# Patient Record
Sex: Male | Born: 1955 | Race: White | Hispanic: No | Marital: Married | Smoking: Never smoker
Health system: Southern US, Community
[De-identification: ages and names within clinical notes are randomized; demographics above are authoritative.]

## PROBLEM LIST (undated history)

## (undated) DIAGNOSIS — K219 Gastro-esophageal reflux disease without esophagitis: Secondary | ICD-10-CM

## (undated) DIAGNOSIS — R519 Headache, unspecified: Secondary | ICD-10-CM

## (undated) DIAGNOSIS — D649 Anemia, unspecified: Secondary | ICD-10-CM

## (undated) DIAGNOSIS — Z973 Presence of spectacles and contact lenses: Secondary | ICD-10-CM

## (undated) DIAGNOSIS — H269 Unspecified cataract: Secondary | ICD-10-CM

## (undated) DIAGNOSIS — R200 Anesthesia of skin: Secondary | ICD-10-CM

## (undated) DIAGNOSIS — F419 Anxiety disorder, unspecified: Secondary | ICD-10-CM

## (undated) DIAGNOSIS — M1990 Unspecified osteoarthritis, unspecified site: Secondary | ICD-10-CM

## (undated) DIAGNOSIS — Z87442 Personal history of urinary calculi: Secondary | ICD-10-CM

## (undated) DIAGNOSIS — R51 Headache: Secondary | ICD-10-CM

## (undated) DIAGNOSIS — I10 Essential (primary) hypertension: Secondary | ICD-10-CM

## (undated) DIAGNOSIS — G2581 Restless legs syndrome: Secondary | ICD-10-CM

## (undated) DIAGNOSIS — F988 Other specified behavioral and emotional disorders with onset usually occurring in childhood and adolescence: Secondary | ICD-10-CM

## (undated) DIAGNOSIS — F329 Major depressive disorder, single episode, unspecified: Secondary | ICD-10-CM

## (undated) DIAGNOSIS — M069 Rheumatoid arthritis, unspecified: Secondary | ICD-10-CM

## (undated) DIAGNOSIS — R202 Paresthesia of skin: Secondary | ICD-10-CM

## (undated) DIAGNOSIS — Z9289 Personal history of other medical treatment: Secondary | ICD-10-CM

## (undated) DIAGNOSIS — F32A Depression, unspecified: Secondary | ICD-10-CM

## (undated) DIAGNOSIS — N400 Enlarged prostate without lower urinary tract symptoms: Secondary | ICD-10-CM

## (undated) DIAGNOSIS — F429 Obsessive-compulsive disorder, unspecified: Secondary | ICD-10-CM

---

## 2003-06-04 ENCOUNTER — Encounter: Admission: RE | Admit: 2003-06-04 | Discharge: 2003-06-04 | Payer: Self-pay | Admitting: Internal Medicine

## 2003-06-04 ENCOUNTER — Encounter: Payer: Self-pay | Admitting: Internal Medicine

## 2006-08-14 HISTORY — PX: ROTATOR CUFF REPAIR: SHX139

## 2012-01-13 HISTORY — PX: TOTAL HIP ARTHROPLASTY: SHX124

## 2012-06-14 HISTORY — PX: HAND TENDON SURGERY: SHX663

## 2012-08-02 ENCOUNTER — Other Ambulatory Visit: Payer: Self-pay | Admitting: Neurosurgery

## 2012-08-30 ENCOUNTER — Other Ambulatory Visit: Payer: Self-pay

## 2012-09-03 ENCOUNTER — Encounter (HOSPITAL_COMMUNITY)
Admission: RE | Admit: 2012-09-03 | Discharge: 2012-09-03 | Payer: BC Managed Care – PPO | Source: Ambulatory Visit | Attending: Neurosurgery | Admitting: Neurosurgery

## 2012-09-10 ENCOUNTER — Encounter (HOSPITAL_COMMUNITY): Payer: Self-pay

## 2012-09-10 ENCOUNTER — Encounter (HOSPITAL_COMMUNITY)
Admission: RE | Admit: 2012-09-10 | Discharge: 2012-09-10 | Disposition: A | Discharge status: Home / Self Care | Payer: BC Managed Care – PPO | Source: Ambulatory Visit | Attending: Neurosurgery | Admitting: Neurosurgery

## 2012-09-10 HISTORY — DX: Obsessive-compulsive disorder, unspecified: F42.9

## 2012-09-10 HISTORY — DX: Rheumatoid arthritis, unspecified: M06.9

## 2012-09-10 HISTORY — DX: Depression, unspecified: F32.A

## 2012-09-10 HISTORY — DX: Gastro-esophageal reflux disease without esophagitis: K21.9

## 2012-09-10 HISTORY — DX: Major depressive disorder, single episode, unspecified: F32.9

## 2012-09-10 LAB — BASIC METABOLIC PANEL
BUN: 20 mg/dL (ref 6–23)
Chloride: 102 mEq/L (ref 96–112)
Glucose, Bld: 92 mg/dL (ref 70–99)
Potassium: 3.6 mEq/L (ref 3.5–5.1)

## 2012-09-10 LAB — TYPE AND SCREEN: Antibody Screen: NEGATIVE

## 2012-09-10 LAB — CBC
HCT: 42 % (ref 39.0–52.0)
Hemoglobin: 15.5 g/dL (ref 13.0–17.0)
MCH: 33.8 pg (ref 26.0–34.0)
MCHC: 36.9 g/dL — ABNORMAL HIGH (ref 30.0–36.0)

## 2012-09-10 NOTE — Pre-Procedure Instructions (Signed)
[name]  09/10/2012   Your procedure is scheduled on:  Friday, January 31  Report to [facility] at 0530 AM.  Call this number if you have problems the morning of surgery: [phone]   Remember:   Do not eat food or drink liquids after midnight. Thursday night   Take these medicines the morning of surgery with A SIP OF WATER: Effexor,Omeprazole, Hydrocodone   Do not wear jewelry, make-up or nail polish.  Do not wear lotions, powders, or perfumes. You may wear deodorant.  Do not shave 48 hours prior to surgery. Men may shave face and neck.  Do not bring valuables to the hospital.  Contacts, dentures or bridgework may not be worn into surgery.  Leave suitcase in the car. After surgery it may be brought to your room.  For patients admitted to the hospital, checkout time is 11:00 AM the day of  discharge.         Special Instructions: Shower using CHG 2 nights before surgery and the night before surgery.  If you shower the day of surgery use CHG.  Use special wash - you have one bottle of CHG for all showers.  You should use approximately 1/3 of the bottle for each shower.   Please read over the following fact sheets that you were given: Pain Booklet, Coughing and Deep Breathing, Blood Transfusion Information, MRSA Information and Surgical Site Infection Prevention

## 2012-09-12 MED ORDER — VANCOMYCIN HCL IN DEXTROSE 1-5 GM/200ML-% IV SOLN
1000.0000 mg | Freq: Once | INTRAVENOUS | Status: AC
  Administered 2012-09-13: 1000 mg via INTRAVENOUS
  Filled 2012-09-12: qty 200

## 2012-09-13 ENCOUNTER — Encounter (HOSPITAL_COMMUNITY): Payer: Self-pay | Admitting: *Deleted

## 2012-09-13 ENCOUNTER — Encounter (HOSPITAL_COMMUNITY): Payer: Self-pay | Admitting: Anesthesiology

## 2012-09-13 ENCOUNTER — Encounter (HOSPITAL_COMMUNITY)
Admission: RE | Disposition: A | Discharge status: Home / Self Care | Payer: Self-pay | Source: Ambulatory Visit | Attending: Neurosurgery

## 2012-09-13 ENCOUNTER — Inpatient Hospital Stay (HOSPITAL_COMMUNITY)
Admission: RE | Admit: 2012-09-13 | Discharge: 2012-09-16 | DRG: 807 | Disposition: A | Discharge status: Home / Self Care | Payer: BC Managed Care – PPO | Source: Ambulatory Visit | Attending: Neurosurgery | Admitting: Neurosurgery

## 2012-09-13 ENCOUNTER — Ambulatory Visit (HOSPITAL_COMMUNITY): Payer: BC Managed Care – PPO

## 2012-09-13 ENCOUNTER — Ambulatory Visit (HOSPITAL_COMMUNITY): Payer: BC Managed Care – PPO | Admitting: Anesthesiology

## 2012-09-13 DIAGNOSIS — M549 Dorsalgia, unspecified: Secondary | ICD-10-CM

## 2012-09-13 DIAGNOSIS — M069 Rheumatoid arthritis, unspecified: Secondary | ICD-10-CM | POA: Diagnosis present

## 2012-09-13 DIAGNOSIS — Z79899 Other long term (current) drug therapy: Secondary | ICD-10-CM

## 2012-09-13 DIAGNOSIS — M51379 Other intervertebral disc degeneration, lumbosacral region without mention of lumbar back pain or lower extremity pain: Secondary | ICD-10-CM | POA: Diagnosis present

## 2012-09-13 DIAGNOSIS — Z01812 Encounter for preprocedural laboratory examination: Secondary | ICD-10-CM

## 2012-09-13 DIAGNOSIS — M4120 Other idiopathic scoliosis, site unspecified: Secondary | ICD-10-CM | POA: Diagnosis present

## 2012-09-13 DIAGNOSIS — F3289 Other specified depressive episodes: Secondary | ICD-10-CM | POA: Diagnosis present

## 2012-09-13 DIAGNOSIS — M5137 Other intervertebral disc degeneration, lumbosacral region: Secondary | ICD-10-CM | POA: Diagnosis present

## 2012-09-13 DIAGNOSIS — F329 Major depressive disorder, single episode, unspecified: Secondary | ICD-10-CM | POA: Diagnosis present

## 2012-09-13 DIAGNOSIS — M47816 Spondylosis without myelopathy or radiculopathy, lumbar region: Secondary | ICD-10-CM | POA: Diagnosis present

## 2012-09-13 DIAGNOSIS — M47817 Spondylosis without myelopathy or radiculopathy, lumbosacral region: Principal | ICD-10-CM | POA: Diagnosis present

## 2012-09-13 DIAGNOSIS — K219 Gastro-esophageal reflux disease without esophagitis: Secondary | ICD-10-CM | POA: Diagnosis present

## 2012-09-13 DIAGNOSIS — Z96649 Presence of unspecified artificial hip joint: Secondary | ICD-10-CM

## 2012-09-13 HISTORY — PX: ABDOMINAL EXPOSURE: SHX5708

## 2012-09-13 HISTORY — PX: ANTERIOR LUMBAR FUSION: SHX1170

## 2012-09-13 HISTORY — PX: LUMBAR PERCUTANEOUS PEDICLE SCREW 2 LEVEL: SHX5561

## 2012-09-13 HISTORY — PX: ANTERIOR LAT LUMBAR FUSION: SHX1168

## 2012-09-13 LAB — POCT I-STAT 4, (NA,K, GLUC, HGB,HCT)
HCT: 30 % — ABNORMAL LOW (ref 39.0–52.0)
Hemoglobin: 10.2 g/dL — ABNORMAL LOW (ref 13.0–17.0)
Potassium: 4.1 mEq/L (ref 3.5–5.1)
Sodium: 139 mEq/L (ref 135–145)

## 2012-09-13 SURGERY — ANTERIOR LUMBAR FUSION 1 LEVEL
Anesthesia: General | Laterality: Right | Wound class: Clean

## 2012-09-13 MED ORDER — ONDANSETRON HCL 4 MG/2ML IJ SOLN: 4.0000 mg | INTRAMUSCULAR | Status: DC | PRN

## 2012-09-13 MED ORDER — SODIUM CHLORIDE 0.9 % IJ SOLN: 3.0000 mL | INTRAMUSCULAR | Status: DC | PRN

## 2012-09-13 MED ORDER — ROCURONIUM BROMIDE 100 MG/10ML IV SOLN
INTRAVENOUS | Status: DC | PRN
  Administered 2012-09-13: 50 mg via INTRAVENOUS

## 2012-09-13 MED ORDER — VECURONIUM BROMIDE 10 MG IV SOLR
INTRAVENOUS | Status: DC | PRN
  Administered 2012-09-13 (×2): 1 mg via INTRAVENOUS

## 2012-09-13 MED ORDER — ROPINIROLE HCL 1 MG PO TABS
4.0000 mg | ORAL_TABLET | Freq: Three times a day (TID) | ORAL | Status: DC
  Administered 2012-09-13 – 2012-09-16 (×8): 4 mg via ORAL
  Filled 2012-09-13 (×11): qty 4

## 2012-09-13 MED ORDER — PANTOPRAZOLE SODIUM 40 MG IV SOLR
40.0000 mg | Freq: Every day | INTRAVENOUS | Status: DC
Start: 2012-09-13 — End: 2012-09-13
  Filled 2012-09-13 (×2): qty 40

## 2012-09-13 MED ORDER — CYCLOBENZAPRINE HCL 10 MG PO TABS
10.0000 mg | ORAL_TABLET | Freq: Three times a day (TID) | ORAL | Status: DC | PRN
  Administered 2012-09-13 – 2012-09-16 (×5): 10 mg via ORAL
  Filled 2012-09-13 (×4): qty 1

## 2012-09-13 MED ORDER — 0.9 % SODIUM CHLORIDE (POUR BTL) OPTIME
TOPICAL | Status: DC | PRN
  Administered 2012-09-13: 1000 mL

## 2012-09-13 MED ORDER — HEMOSTATIC AGENTS (NO CHARGE) OPTIME
TOPICAL | Status: DC | PRN
  Administered 2012-09-13: 1 via TOPICAL

## 2012-09-13 MED ORDER — MIDAZOLAM HCL 5 MG/5ML IJ SOLN
INTRAMUSCULAR | Status: DC | PRN
  Administered 2012-09-13: 2 mg via INTRAVENOUS

## 2012-09-13 MED ORDER — BACITRACIN 50000 UNITS IM SOLR
INTRAMUSCULAR | Status: AC
  Filled 2012-09-13: qty 1

## 2012-09-13 MED ORDER — MEPERIDINE HCL 25 MG/ML IJ SOLN
6.2500 mg | INTRAMUSCULAR | Status: DC | PRN
  Administered 2012-09-13 (×2): 12.5 mg via INTRAVENOUS

## 2012-09-13 MED ORDER — ARTIFICIAL TEARS OP OINT
TOPICAL_OINTMENT | OPHTHALMIC | Status: DC | PRN
  Administered 2012-09-13: 1 via OPHTHALMIC

## 2012-09-13 MED ORDER — PHENOL 1.4 % MT LIQD: 1.0000 | OROMUCOSAL | Status: DC | PRN

## 2012-09-13 MED ORDER — ACETAMINOPHEN 650 MG RE SUPP: 650.0000 mg | RECTAL | Status: DC | PRN

## 2012-09-13 MED ORDER — HYDROMORPHONE HCL PF 1 MG/ML IJ SOLN
INTRAMUSCULAR | Status: AC
  Administered 2012-09-13: 0.5 mg via INTRAVENOUS
  Filled 2012-09-13: qty 1

## 2012-09-13 MED ORDER — SODIUM CHLORIDE 0.9 % IR SOLN
Status: DC | PRN
  Administered 2012-09-13 (×3)

## 2012-09-13 MED ORDER — LACTATED RINGERS IV SOLN
INTRAVENOUS | Status: DC | PRN
  Administered 2012-09-13 (×2): via INTRAVENOUS

## 2012-09-13 MED ORDER — CYCLOBENZAPRINE HCL 10 MG PO TABS
ORAL_TABLET | ORAL | Status: AC
  Administered 2012-09-13: 10 mg via ORAL
  Filled 2012-09-13: qty 1

## 2012-09-13 MED ORDER — ALBUMIN HUMAN 5 % IV SOLN
INTRAVENOUS | Status: DC | PRN
  Administered 2012-09-13 (×3): via INTRAVENOUS

## 2012-09-13 MED ORDER — BUPIVACAINE HCL (PF) 0.25 % IJ SOLN
INTRAMUSCULAR | Status: DC | PRN
  Administered 2012-09-13: 26 mL

## 2012-09-13 MED ORDER — BACITRACIN 50000 UNITS IM SOLR
INTRAMUSCULAR | Status: AC
  Filled 2012-09-13: qty 2

## 2012-09-13 MED ORDER — MUPIROCIN 2 % EX OINT
TOPICAL_OINTMENT | CUTANEOUS | Status: AC
  Administered 2012-09-13: 1 via NASAL
  Filled 2012-09-13: qty 22

## 2012-09-13 MED ORDER — SUFENTANIL CITRATE 50 MCG/ML IV SOLN
INTRAVENOUS | Status: DC | PRN
  Administered 2012-09-13 (×10): 10 ug via INTRAVENOUS

## 2012-09-13 MED ORDER — SODIUM CHLORIDE 0.9 % IJ SOLN
3.0000 mL | Freq: Two times a day (BID) | INTRAMUSCULAR | Status: DC
Start: 2012-09-13 — End: 2012-09-16
  Administered 2012-09-14 – 2012-09-15 (×3): 3 mL via INTRAVENOUS

## 2012-09-13 MED ORDER — MEPERIDINE HCL 50 MG/ML IJ SOLN
INTRAMUSCULAR | Status: AC
  Filled 2012-09-13: qty 1

## 2012-09-13 MED ORDER — ONDANSETRON HCL 4 MG/2ML IJ SOLN
INTRAMUSCULAR | Status: DC | PRN
  Administered 2012-09-13: 4 mg via INTRAVENOUS

## 2012-09-13 MED ORDER — HYDROMORPHONE HCL PF 1 MG/ML IJ SOLN
1.0000 mg | INTRAMUSCULAR | Status: DC | PRN
  Administered 2012-09-13: 1 mg via INTRAMUSCULAR
  Administered 2012-09-13 – 2012-09-14 (×2): 1.5 mg via INTRAMUSCULAR
  Administered 2012-09-14: 1 mg via INTRAMUSCULAR
  Administered 2012-09-14 (×3): 1.5 mg via INTRAMUSCULAR
  Administered 2012-09-15 – 2012-09-16 (×3): 1 mg via INTRAMUSCULAR
  Filled 2012-09-13: qty 1
  Filled 2012-09-13: qty 2
  Filled 2012-09-13: qty 1
  Filled 2012-09-13: qty 2
  Filled 2012-09-13 (×3): qty 1
  Filled 2012-09-13 (×3): qty 2

## 2012-09-13 MED ORDER — SODIUM CHLORIDE 0.9 % IV SOLN
INTRAVENOUS | Status: AC
  Filled 2012-09-13: qty 1000

## 2012-09-13 MED ORDER — SODIUM CHLORIDE 0.9 % IV SOLN: 250.0000 mL | INTRAVENOUS | Status: DC

## 2012-09-13 MED ORDER — DEXAMETHASONE SODIUM PHOSPHATE 4 MG/ML IJ SOLN
INTRAMUSCULAR | Status: DC | PRN
  Administered 2012-09-13: 4 mg via INTRAVENOUS

## 2012-09-13 MED ORDER — LIDOCAINE HCL (CARDIAC) 20 MG/ML IV SOLN
INTRAVENOUS | Status: DC | PRN
  Administered 2012-09-13: 100 mg via INTRAVENOUS

## 2012-09-13 MED ORDER — THROMBIN 20000 UNITS EX SOLR
CUTANEOUS | Status: DC | PRN
  Administered 2012-09-13: 08:00:00 via TOPICAL

## 2012-09-13 MED ORDER — HYDROMORPHONE HCL PF 1 MG/ML IJ SOLN
0.2500 mg | INTRAMUSCULAR | Status: DC | PRN
  Administered 2012-09-13 (×4): 0.5 mg via INTRAVENOUS

## 2012-09-13 MED ORDER — PHENYLEPHRINE HCL 10 MG/ML IJ SOLN
10.0000 mg | INTRAVENOUS | Status: DC | PRN
  Administered 2012-09-13: 10 ug/min via INTRAVENOUS

## 2012-09-13 MED ORDER — KCL IN DEXTROSE-NACL 20-5-0.45 MEQ/L-%-% IV SOLN
80.0000 mL/h | INTRAVENOUS | Status: DC
  Administered 2012-09-13 (×2): 80 mL/h via INTRAVENOUS
  Filled 2012-09-13 (×7): qty 1000

## 2012-09-13 MED ORDER — SODIUM CHLORIDE 0.9 % IV SOLN
INTRAVENOUS | Status: AC
  Filled 2012-09-13: qty 500

## 2012-09-13 MED ORDER — PROPOFOL 10 MG/ML IV BOLUS
INTRAVENOUS | Status: DC | PRN
  Administered 2012-09-13: 150 mg via INTRAVENOUS
  Administered 2012-09-13: 20 mg via INTRAVENOUS

## 2012-09-13 MED ORDER — HYDROCODONE-ACETAMINOPHEN 5-325 MG PO TABS
ORAL_TABLET | ORAL | Status: AC
  Administered 2012-09-13: 2 via ORAL
  Filled 2012-09-13: qty 2

## 2012-09-13 MED ORDER — PANTOPRAZOLE SODIUM 40 MG PO TBEC
40.0000 mg | DELAYED_RELEASE_TABLET | Freq: Every day | ORAL | Status: DC
  Administered 2012-09-13 – 2012-09-15 (×3): 40 mg via ORAL
  Filled 2012-09-13 (×3): qty 1

## 2012-09-13 MED ORDER — MENTHOL 3 MG MT LOZG: 1.0000 | LOZENGE | OROMUCOSAL | Status: DC | PRN

## 2012-09-13 MED ORDER — ACETAMINOPHEN 325 MG PO TABS: 650.0000 mg | ORAL_TABLET | ORAL | Status: DC | PRN

## 2012-09-13 MED ORDER — CEFAZOLIN SODIUM-DEXTROSE 2-3 GM-% IV SOLR
2.0000 g | Freq: Three times a day (TID) | INTRAVENOUS | Status: AC
  Administered 2012-09-13 – 2012-09-14 (×3): 2 g via INTRAVENOUS
  Filled 2012-09-13 (×3): qty 50

## 2012-09-13 MED ORDER — VENLAFAXINE HCL 75 MG PO TABS
75.0000 mg | ORAL_TABLET | Freq: Every day | ORAL | Status: DC
  Administered 2012-09-14 – 2012-09-16 (×3): 75 mg via ORAL
  Filled 2012-09-13 (×3): qty 1

## 2012-09-13 MED ORDER — LACTATED RINGERS IV SOLN
INTRAVENOUS | Status: DC | PRN
  Administered 2012-09-13 (×4): via INTRAVENOUS

## 2012-09-13 MED ORDER — HYDROCODONE-ACETAMINOPHEN 5-325 MG PO TABS
1.0000 | ORAL_TABLET | ORAL | Status: DC | PRN
  Administered 2012-09-13 – 2012-09-16 (×5): 2 via ORAL
  Filled 2012-09-13 (×4): qty 2

## 2012-09-13 SURGICAL SUPPLY — 111 items
ADH SKN CLS APL DERMABOND .7 (GAUZE/BANDAGES/DRESSINGS) ×9
APL SKNCLS STERI-STRIP NONHPOA (GAUZE/BANDAGES/DRESSINGS) ×9
BAG DECANTER FOR FLEXI CONT (MISCELLANEOUS) ×12 IMPLANT
BENZOIN TINCTURE PRP APPL 2/3 (GAUZE/BANDAGES/DRESSINGS) ×10 IMPLANT
BLADE SURG ROTATE 9660 (MISCELLANEOUS) IMPLANT
BONE EQUIVA 10CC (Bone Implant) ×6 IMPLANT
BRUSH SCRUB EZ PLAIN DRY (MISCELLANEOUS) ×6 IMPLANT
BUR EGG ELITE 5.0 (BURR) ×1 IMPLANT
BUR MATCHSTICK NEURO 3.0 LAGG (BURR) ×1 IMPLANT
CAGE LUCENT ALIF 14MM (Cage) ×1 IMPLANT
CANISTER SUCTION 2500CC (MISCELLANEOUS) ×4 IMPLANT
CLOTH BEACON ORANGE TIMEOUT ST (SAFETY) ×15 IMPLANT
CONT SPEC 4OZ CLIKSEAL STRL BL (MISCELLANEOUS) ×5 IMPLANT
COVER BACK TABLE 24X17X13 BIG (DRAPES) ×1 IMPLANT
COVER TABLE BACK 60X90 (DRAPES) ×3 IMPLANT
DERMABOND ADVANCED (GAUZE/BANDAGES/DRESSINGS) ×3
DERMABOND ADVANCED .7 DNX12 (GAUZE/BANDAGES/DRESSINGS) ×11 IMPLANT
DISC SHIM STAINLESS STEEL ×1 IMPLANT
DRAPE C-ARM 42X72 X-RAY (DRAPES) ×19 IMPLANT
DRAPE C-ARMOR (DRAPES) ×10 IMPLANT
DRAPE INCISE IOBAN 66X45 STRL (DRAPES) IMPLANT
DRAPE LAPAROTOMY 100X72X124 (DRAPES) ×12 IMPLANT
DRAPE SURG 17X23 STRL (DRAPES) ×18 IMPLANT
DRESSING TELFA 8X3 (GAUZE/BANDAGES/DRESSINGS) ×8 IMPLANT
DURAPREP 26ML APPLICATOR (WOUND CARE) ×6 IMPLANT
ELECT BLADE 4.0 EZ CLEAN MEGAD (MISCELLANEOUS) ×8
ELECT REM PT RETURN 9FT ADLT (ELECTROSURGICAL) ×12
ELECTRODE BLDE 4.0 EZ CLN MEGD (MISCELLANEOUS) ×6 IMPLANT
ELECTRODE REM PT RTRN 9FT ADLT (ELECTROSURGICAL) ×9 IMPLANT
EVACUATOR 1/8 PVC DRAIN (DRAIN) IMPLANT
FORCEPS BPLR BAYO 10IN 1.0TIP (INSTRUMENTS) ×1 IMPLANT
GAUZE SPONGE 4X4 16PLY XRAY LF (GAUZE/BANDAGES/DRESSINGS) IMPLANT
GLOVE BIO SURGEON STRL SZ8 (GLOVE) IMPLANT
GLOVE ECLIPSE 7.5 STRL STRAW (GLOVE) ×17 IMPLANT
GLOVE EXAM NITRILE LRG STRL (GLOVE) ×3 IMPLANT
GLOVE EXAM NITRILE XL STR (GLOVE) IMPLANT
GLOVE EXAM NITRILE XS STR PU (GLOVE) IMPLANT
GLOVE INDICATOR 8.0 STRL GRN (GLOVE) ×4 IMPLANT
GLOVE SS BIOGEL STRL SZ 7.5 (GLOVE) ×3 IMPLANT
GLOVE SS N UNI LF 7.5 STRL (GLOVE) ×2 IMPLANT
GLOVE SUPERSENSE BIOGEL SZ 7.5 (GLOVE) ×1
GOWN BRE IMP SLV AUR LG STRL (GOWN DISPOSABLE) ×8 IMPLANT
GOWN BRE IMP SLV AUR XL STRL (GOWN DISPOSABLE) ×2 IMPLANT
GOWN STRL NON-REIN LRG LVL3 (GOWN DISPOSABLE) ×4 IMPLANT
GOWN STRL REIN 2XL LVL4 (GOWN DISPOSABLE) ×12 IMPLANT
HEMOSTAT POWDER KIT SURGIFOAM (HEMOSTASIS) IMPLANT
INSERT FOGARTY 61MM (MISCELLANEOUS) IMPLANT
INSERT FOGARTY SM (MISCELLANEOUS) IMPLANT
K-WIRE  1.6X 450L (WIRE) ×1
K-WIRE 1.6X 450L (WIRE) ×3
K-WIRE NITHNOL TROCAR TIP (WIRE) ×3 IMPLANT
KIT BASIN OR (CUSTOM PROCEDURE TRAY) ×11 IMPLANT
KIT DISP MARS 3V (KITS) ×1 IMPLANT
KIT PEDICLE ACCESS (KITS) ×2 IMPLANT
KIT ROOM TURNOVER OR (KITS) ×14 IMPLANT
KWIRE 1.6X 450L (WIRE) IMPLANT
LOOP VESSEL MAXI BLUE (MISCELLANEOUS) IMPLANT
LOOP VESSEL MINI RED (MISCELLANEOUS) IMPLANT
NDL SPNL 18GX3.5 QUINCKE PK (NEEDLE) ×3 IMPLANT
NEEDLE HYPO 22GX1.5 SAFETY (NEEDLE) ×8 IMPLANT
NEEDLE SPNL 18GX3.5 QUINCKE PK (NEEDLE) ×4 IMPLANT
NEEDLE TARGETING (NEEDLE) ×3 IMPLANT
NS IRRIG 1000ML POUR BTL (IV SOLUTION) ×10 IMPLANT
PACK LAMINECTOMY NEURO (CUSTOM PROCEDURE TRAY) ×8 IMPLANT
PAD ARMBOARD 7.5X6 YLW CONV (MISCELLANEOUS) ×14 IMPLANT
ROD PREBENT PREC 70MM (Rod) ×2 IMPLANT
SCREW FIXED 30MM (Screw) ×2 IMPLANT
SCREW FIXED 5.5X28MM (Screw) ×2 IMPLANT
SCREW POLYAXIA MIS 6.5X40MM (Screw) ×3 IMPLANT
SPACER CALIBER L 7-10M 10X45M (Spacer) ×2 IMPLANT
SPONGE GAUZE 4X4 12PLY (GAUZE/BANDAGES/DRESSINGS) ×10 IMPLANT
SPONGE INTESTINAL PEANUT (DISPOSABLE) ×8 IMPLANT
SPONGE LAP 18X18 X RAY DECT (DISPOSABLE) ×4 IMPLANT
SPONGE LAP 4X18 X RAY DECT (DISPOSABLE) IMPLANT
SPONGE SURGIFOAM ABS GEL SZ50 (HEMOSTASIS) ×3 IMPLANT
STAPLER VISISTAT 35W (STAPLE) IMPLANT
STRIP CLOSURE SKIN 1/2X4 (GAUZE/BANDAGES/DRESSINGS) ×7 IMPLANT
SUT MNCRL AB 4-0 PS2 18 (SUTURE) ×3 IMPLANT
SUT PROLENE 4 0 RB 1 (SUTURE)
SUT PROLENE 4-0 RB1 .5 CRCL 36 (SUTURE) ×12 IMPLANT
SUT PROLENE 5 0 CC1 (SUTURE) IMPLANT
SUT PROLENE 6 0 C 1 30 (SUTURE) ×2 IMPLANT
SUT PROLENE 6 0 CC (SUTURE) IMPLANT
SUT SILK 0 TIES 10X30 (SUTURE) ×4 IMPLANT
SUT SILK 2 0 TIES 10X30 (SUTURE) ×8 IMPLANT
SUT SILK 2 0SH CR/8 30 (SUTURE) IMPLANT
SUT SILK 3 0 TIES 10X30 (SUTURE) ×4 IMPLANT
SUT SILK 3 0 TIES 17X18 (SUTURE)
SUT SILK 3 0SH CR/8 30 (SUTURE) IMPLANT
SUT SILK 3-0 18XBRD TIE BLK (SUTURE) IMPLANT
SUT VIC AB 0 CT1 18XCR BRD8 (SUTURE) IMPLANT
SUT VIC AB 0 CT1 27 (SUTURE) ×4
SUT VIC AB 0 CT1 27XBRD ANBCTR (SUTURE) ×3 IMPLANT
SUT VIC AB 0 CT1 8-18 (SUTURE)
SUT VIC AB 2-0 CP2 18 (SUTURE) IMPLANT
SUT VIC AB 2-0 CT1 36 (SUTURE) ×4 IMPLANT
SUT VIC AB 2-0 OS6 18 (SUTURE) ×36 IMPLANT
SUT VIC AB 3-0 CP2 18 (SUTURE) ×19 IMPLANT
SUT VIC AB 3-0 SH 27 (SUTURE) ×4
SUT VIC AB 3-0 SH 27X BRD (SUTURE) ×3 IMPLANT
SUT VIC AB 3-0 SH 8-18 (SUTURE) IMPLANT
SYR 20ML ECCENTRIC (SYRINGE) ×10 IMPLANT
TAPE CLOTH 3X10 TAN LF (GAUZE/BANDAGES/DRESSINGS) ×8 IMPLANT
TISSUE DILATOR C RADIOLUCENT ×6 IMPLANT
TOP CLSR SEQUOIA (Orthopedic Implant) ×3 IMPLANT
TOWEL OR 17X24 6PK STRL BLUE (TOWEL DISPOSABLE) ×20 IMPLANT
TOWEL OR 17X26 10 PK STRL BLUE (TOWEL DISPOSABLE) ×16 IMPLANT
TRAP SPECIMEN MUCOUS 40CC (MISCELLANEOUS) ×2 IMPLANT
TRAY FOLEY CATH 14FRSI W/METER (CATHETERS) ×7 IMPLANT
WATER STERILE IRR 1000ML POUR (IV SOLUTION) ×8 IMPLANT
trinica alp ×1 IMPLANT

## 2012-09-13 NOTE — Preoperative (Signed)
Beta Blockers   Reason not to administer Beta Blockers:Not Applicable 

## 2012-09-13 NOTE — Anesthesia Preprocedure Evaluation (Addendum)
Anesthesia Evaluation  Patient identified by MRN, date of birth, ID band Patient awake    Reviewed: Allergy & Precautions, H&P , NPO status , Patient's Chart, lab work & pertinent test results, reviewed documented beta blocker date and time   Airway Mallampati: II TM Distance: >3 FB Neck ROM: Full    Dental  (+) Teeth Intact and Dental Advisory Given   Pulmonary  breath sounds clear to auscultation        Cardiovascular Rhythm:Regular Rate:Normal     Neuro/Psych    GI/Hepatic Neg liver ROS, GERD-  Medicated,  Endo/Other  negative endocrine ROS  Renal/GU negative Renal ROS     Musculoskeletal  (+) Arthritis -,   Abdominal   Peds  Hematology   Anesthesia Other Findings   Reproductive/Obstetrics                          Anesthesia Physical Anesthesia Plan  ASA: II  Anesthesia Plan: General   Post-op Pain Management:    Induction: Intravenous  Airway Management Planned: Oral ETT  Additional Equipment: Arterial line  Intra-op Plan:   Post-operative Plan: Possible Post-op intubation/ventilation  Informed Consent: I have reviewed the patients History and Physical, chart, labs and discussed the procedure including the risks, benefits and alternatives for the proposed anesthesia with the patient or authorized representative who has indicated his/her understanding and acceptance.   Dental advisory given  Plan Discussed with: CRNA, Anesthesiologist and Surgeon  Anesthesia Plan Comments:       Anesthesia Quick Evaluation

## 2012-09-13 NOTE — H&P (Signed)
[name] is an 57 y.o. male.   Chief Complaint: Back and leg pain HPI: The patient is a 57 year old male who presented with back and lower extremity discomfort. He that she undergo a hip replacement for this problem which gave him no relief. He was then evaluated with back imaging studies which showed severe degenerative disease with scoliosis and listhesis and stenosis at L2-3 L3-4 and L4-5. After discussing the options and failing additional conservative therapy the patient requested surgery. His films were reviewed and initially the hope was to do a three-level lateral lumbar fusion but is L4-5 level was way below the iliac crest was therefore elected to do an anterior lumbar fusion at L4-5  and do L2-3 and L3-4 lateral fusion. I've had a long discussion with him regarding the risks and benefits of surgical intervention. The risks discussed include but are not limited to bleeding infection weakness numbness trouble with instrumentation abdominal injury the issues associated with lateral surgery spinal fluid leakage coma and death. We have discussed alternative methods of therapy offered risks and benefits of nonintervention. He's had the opportunity numerous questions and appears to understand. With this information in hand he has requested surgical intervention.  Past Medical History  Diagnosis Date  . Depression   . OCD (obsessive compulsive disorder)   . GERD (gastroesophageal reflux disease)   . Rheumatoid arthritis     sees Dr Sharmon Revere @ [facility] in [facility]    Past Surgical History  Procedure Date  . Joint replacement   . Total hip arthroplasty 01/2012    done in [facility]  . Hand tendon surgery 06/2012    done @ [facility] in [place]  . Rotator cuff repair 2008     done in [facility]    History reviewed. No pertinent family history. Social History:  reports that he has never smoked. He does not have any smokeless tobacco history on file. He reports that he drinks alcohol. He  reports that he does not use illicit drugs.  Allergies:  Allergies  Allergen Reactions  . Penicillins Rash    Medications Prior to Admission  Medication Sig Dispense Refill  . folic acid (FOLVITE) 1 MG tablet Take 1 mg by mouth daily.      Marland Kitchen HYDROcodone-acetaminophen (NORCO) 10-325 MG per tablet Take 1 tablet by mouth every 6 (six) hours as needed.      Marland Kitchen ibuprofen (ADVIL,MOTRIN) 200 MG tablet Take 200 mg by mouth every 6 (six) hours as needed. For pain      . omeprazole (PRILOSEC) 20 MG capsule Take 20 mg by mouth daily.      Marland Kitchen rOPINIRole (REQUIP) 4 MG tablet Take 4 mg by mouth 3 (three) times daily.      Marland Kitchen venlafaxine (EFFEXOR) 75 MG tablet Take 75 mg by mouth daily.      . Golimumab (SIMPONI [place]) Inject 1 application into the skin every 30 (thirty) days.        No results found for this or any previous visit (from the past 48 hour(s)). No results found.  Review of systems not obtained due to patient factors.  Blood pressure 134/91, pulse 83, temperature 97.9 F (36.6 C), temperature source Oral, resp. rate 18, SpO2 98.00%.  The patient is awake or and oriented. His gait is slow mildly antalgic. His reflexes are decreased. His strength is intact. Assessment/Plan Impression is that of severe degenerative disease with scoliosis listhesis and stenosis at L2-3 L3-4 and L4-5. The plan is for a three-level  fusion with instrumentation.  Reinaldo Meeker, MD 09/13/2012, 7:34 AM

## 2012-09-13 NOTE — OR Nursing (Signed)
Procedure two ended at 1235 patient reposition prone at 1250 prep done at 1300 third procedure started at 1312

## 2012-09-13 NOTE — Op Note (Signed)
OPERATIVE REPORT  DATE OF SURGERY: 09/13/2012  PATIENT: [name], 57 y.o. male MRN: [id number]  DOB: 02-18-1956  PRE-OPERATIVE DIAGNOSIS: Lumbar disc disease  POST-OPERATIVE DIAGNOSIS:  Same  PROCEDURE: Anterior exposure for ALIF L4-5  SURGEON:  Gretta Began, M.D.  Co-surgeon for the exposure: Dr. Aliene Beams  ANESTHESIA:  Gen.  EBL: 100 ml  Total I/O In: 3500 [I.V.:3000; IV Piggyback:500] Out: 290 [Urine:140; Blood:150]  BLOOD ADMINISTERED: None  DRAINS: None    COUNTS CORRECT:  YES  PLAN OF CARE: Continued surgery with lateral approach to upper lumbar discs   PATIENT DISPOSITION:  PACU - hemodynamically stable  PROCEDURE DETAILS: The patient was seen in the preoperative holding area by myself. I explained my role in the exposure for the L4-L5 fusion. I explained mobilization of intraperitoneal contents, ureter and the iliac arteries and veins. Explain potential injury as well. The patient understands and wished to proceed with surgery  Patient was taken to the operating placed supine position where the lateral C-arm projection was used to expose the level of the L4-L5 disc space. This was marked on the skin. The abdomen was prepped and draped in usual sterile fashion. Incision was made from the midline out laterally to the left. The incision was continued deeper with electrocautery down to the level of the fascia. The anterior fascia was exposed. The anterior rectus sheath was opened with cautery in line with the skin incision and the rectus muscle was circumferentially mobilized. The retroperitoneal space was entered bluntly and the peritoneal sac was mobilized to the right. The posterior rectus sheath was opened laterally taking care not to enter the peritoneal space. Blunt dissection was used above the level of the psoas muscle to give exposure to the L4-5 disc. The iliolumbar vein was identified off the posterior aspect of the left iliac vein. As was ligated with a 2  silk ties and divided. Blunt dissection was continued to mobilize the iliac artery and vein to the right.   the [facility] retractor was brought onto the field and the reverse 150 the plate was positioned to the right of the disc and the 100 reverse lip blade was placed to the left of the disc. The mouth retractor was used for superior and inferior exposure. The disc surgery was then accomplished with Dr.Kritzer which we dictated as a separate note. Following this, I re\re scrubbed and removed retractor. There was no evidence of any injury to the intra-abdominal contents. The wound irrigated with saline. Anterior rectus sheath was closed with a 0 Vicryl in running fashion. The skin was closed with a 30 subcutaneous Vicryl stitch. Sterile dressing was applied and remaining disc surgery was accomplished     Gretta Began, M.D. 09/13/2012 11:34 AM

## 2012-09-13 NOTE — Anesthesia Postprocedure Evaluation (Signed)
  Anesthesia Post-op Note  Patient: [name]  Procedure(s) Performed: Procedure(s) (LRB) with comments: ANTERIOR LUMBAR FUSION 1 LEVEL (N/A) - lumbar four-five ANTERIOR LATERAL LUMBAR FUSION 2 LEVELS (Right) - lumbar two three,three-four LUMBAR PERCUTANEOUS PEDICLE SCREW 2 LEVEL (Left) - left pedicle screws two -three,three-four ABDOMINAL EXPOSURE (N/A)  Patient Location: PACU  Anesthesia Type:General  Level of Consciousness: awake  Airway and Oxygen Therapy: Patient Spontanous Breathing  Post-op Pain: mild  Post-op Assessment: Post-op Vital signs reviewed  Post-op Vital Signs: Reviewed  Complications: No apparent anesthesia complications

## 2012-09-13 NOTE — Op Note (Signed)
Preop diagnosis: Scoliosis with listhesis and stenosis L2-3 L3-4 L4-5 Postop diagnosis: Same Procedure: L4-5 anterior lumbar interbody fusion with peek interbody spacer and L4-5 anterior lumbar plating followed by L2-3 L3-4 anterolateral fusion via right retroperitoneal approach followed by left L2-3 L3-4 pedicle screw fixation with Pathfinder percutaneous pedicle screws Surgeon: Sun Kihn Assistant: Elsner  After being placed in the supine position patient's abdomen was prepped and draped in the usual sterile fashion. Dr. Tawanna Cooler early of the vascular service performed the exposure down to the L4-5 disc and placed his retractors in standard fashion. We then assumed control of the case at that time. We incised the disc at L4-5 with a 15 blade and thoroughly cleaned out with pituitary rongeurs curettes and Kerrison punches. We identified the midline and also follow our depth until we had removed 95% of the disc material and were virtually down to the posterior longitudinal ligament. We then began to do some spacer trials and we settled on a 6 mm angled 14 mm graft. We filled with a mixture of morselized allograft and impacted it without difficulty. We then chose an appropriately length anterior lumbar plate. We drilled through the holes and then placed 30 mm screws in L4 and 28 mm screws at L5. These were followed in excellent position and then the locking mechanism was rotated to locked position. Fluoroscopy in AP lateral direction showed good placement of the system. We irrigated copiously and then Dr. early closed the abdomen in standard fashion. We then turned the patient to the right side up lateral decubitus position. We secured to the table and aligned in AP lateral fluoroscopy in standard fashion. We then made a linear incision over the L2-3 interspace and carried down to the muscle. We then made a second incision more posterior and to the retroperitoneum turned our finger upward to allow access into the  retroperitoneum for the more flank incision. We passed our first dilator through the psoas muscle using EMG monitoring to help [place] through.no abnormal readings. We then did sequential dilation and eventually put the retractor in. We opened the retractor slightly did more electrical testing which showed no abnormal readings. We then secured the retractor to the disc space with the shim. We then coagulated this few fibers muscle the remainder incised the disc space with a 15 blade. We cleaned out thoroughly with a variety of instruments and release the annulus on the opposite side with the Cobb elevator. We then did sequential distraction with a variety of trials and then chose to do a 7 mm expandable cage. We followed this with morselized allograft and impacted without difficulty. We then expanded up to approximately a 9 mm size which was found to be in excellent appearance on the fluoroscopy. We then irrigated copiously remove the impactor and final fluoroscopy showed good position of the cage at this level. We then did a similar procedure at L3-4. We made a linear incision above the disc space and then followed down almost into the retroperitoneum. We once again used a posterior incision to allow access into the retroperitoneum from the anterior incision at L3-4. We did sequential dilation through the psoas muscle using EMG testing to give [place] a safe passage. We then secured the retractor to the disc space after confirming good positioning on AP lateral fluoroscopy. We then coagulated a few Celeste fibers the remainder incised the disc and thoroughly cleaned out with a variety of instruments. We then release the annulus on the opposite side of the small Cobb  elevator and did sequential distraction with trials. Once again settled on a 7 mm expandable cage impacted without difficulty after irrigating copiously. We then removed the retractor and we confirmed good position of the cages at both levels and this was  confirmed. We then closed all 3 incisions and the lateral position with interrupted Vicryl on the fascia and subcutaneous tissue and Dermabond on the skin. Sterile dressings were applied the patient was then turned into prone position. His back was prepped and draped in usual sterile fashion. We placed percutaneous pedicle screws L2-L3 and L4 on the left side. We targeted the pedicles in AP direction passed the Jamshidi needle from lateral to medial direction and pass a guidewire through the needle. We then connected the 2 small incisions made one incision and incised the fascia between the guidewires. We then did sequential dilation through the muscle broke the bony surface with the awl and then tapped with a 5.5 mm tap and placed 6.5 x 40 mm screws at all 3 levels. He's had the [facility] attached. We then measured and chosen 70 mm rod passed down to the [place] and reduced it into the top of the screw heads with the top loading nuts. We then did tightening and final tightening with torque and counter torque and then removed the Frankewing without difficulty. Final fluoroscopy in AP lateral direction looked excellent. We then irrigated the incision and closed with Vicryl on the fascia subcutaneous and subcuticular tissues Dermabond and Steri-Strips on the skin. Shortness was then applied the patient was extubated and taken to recovery in stable condition.

## 2012-09-13 NOTE — OR Nursing (Signed)
Stim needle electrodes placed by Jacklynn Lewis and Palma Holter at [phone]

## 2012-09-13 NOTE — Transfer of Care (Signed)
Immediate Anesthesia Transfer of Care Note  Patient: [name]  Procedure(s) Performed: Procedure(s) (LRB) with comments: ANTERIOR LUMBAR FUSION 1 LEVEL (N/A) - lumbar four-five ANTERIOR LATERAL LUMBAR FUSION 2 LEVELS (Right) - lumbar two three,three-four LUMBAR PERCUTANEOUS PEDICLE SCREW 2 LEVEL (Left) - left pedicle screws two -three,three-four ABDOMINAL EXPOSURE (N/A)  Patient Location: PACU  Anesthesia Type:General  Level of Consciousness: sedated  Airway & Oxygen Therapy: Patient Spontanous Breathing and Patient connected to face mask oxygen  Post-op Assessment: Report given to PACU RN and Post -op Vital signs reviewed and stable  Post vital signs: Reviewed and stable  Complications: No apparent anesthesia complications

## 2012-09-13 NOTE — OR Nursing (Signed)
Procedure two start at 1055 patient reposition at 1025 prep at 1049

## 2012-09-13 NOTE — OR Nursing (Signed)
Procedure one ended at 1010 Dr Jena Gauss phoned stated xray okay at 1017

## 2012-09-14 DIAGNOSIS — M47816 Spondylosis without myelopathy or radiculopathy, lumbar region: Secondary | ICD-10-CM | POA: Diagnosis present

## 2012-09-14 MED ORDER — OXYCODONE-ACETAMINOPHEN 5-325 MG PO TABS
1.0000 | ORAL_TABLET | ORAL | Status: DC | PRN
  Administered 2012-09-14 – 2012-09-16 (×10): 2 via ORAL
  Filled 2012-09-14 (×10): qty 2

## 2012-09-14 MED ORDER — KETOROLAC TROMETHAMINE 15 MG/ML IJ SOLN
15.0000 mg | Freq: Four times a day (QID) | INTRAMUSCULAR | Status: AC
  Administered 2012-09-14 – 2012-09-15 (×5): 15 mg via INTRAVENOUS
  Filled 2012-09-14 (×5): qty 1

## 2012-09-14 NOTE — Progress Notes (Signed)
Patient's foley cath d/c'd this morning around 0615 without difficulties. Out of bed and ambulated 60 ft.

## 2012-09-14 NOTE — Progress Notes (Signed)
Orthopedic Tech Progress Note Patient Details:  [name] 1955-09-09 [id number] Brace order placed with Reita Cliche from [employer] Patient ID: [name], male   DOB: 1956/08/09, 57 y.o.   MRN: [id number]   [name] 09/14/2012, 12:28 PM

## 2012-09-14 NOTE — Progress Notes (Signed)
Subjective: Patient reports Reports some increased pain in back and abdomen both also notes pain and flank.  Objective: Vital signs in last 24 hours: Temp:  [97.4 F (36.3 C)-99.3 F (37.4 C)] 97.9 F (36.6 C) (02/01 1000) Pulse Rate:  [92-136] 100  (02/01 1000) Resp:  [11-20] 18  (02/01 1000) BP: (121-168)/(65-98) 133/65 mmHg (02/01 1000) SpO2:  [88 %-100 %] 95 % (02/01 1000) Weight:  [68.04 kg (150 lb)] 68.04 kg (150 lb) (01/31 2117)  Intake/Output from previous day: 01/31 0701 - 02/01 0700 In: 5850 [I.V.:5100; IV Piggyback:750] Out: 4765 [Urine:4565; Blood:200] Intake/Output this shift:    Incisions are clean and dry motor function appears intact.  Lab Results:  Basename 09/13/12 1117  WBC --  HGB 10.2*  HCT 30.0*  PLT --   BMET  Basename 09/13/12 1117  NA 139  K 4.1  CL --  CO2 --  GLUCOSE 112*  BUN --  CREATININE --  CALCIUM --    Studies/Results: Dg Lumbar Spine 2-3 Views  09/13/2012  *RADIOLOGY REPORT*  Clinical Data: Back pain  DG C-ARM GT 120 MIN,LUMBAR SPINE - 2-3 VIEW  Comparison:   05/17/2012.  Findings: C-arm films document L4-5 anterior lumbar interbody fusion, with L2-3 and L3-4  XLIF.  Right-sided pedicle screws have been placed from L2-L4 and connected by a single rod. The degree of asymmetric loss of interspace height at L2-3 on the left is improved compared with preoperative study  IMPRESSION: As above.   Original Report Authenticated By: Davonna Belling, M.D.    Dg C-arm Gt 120 Min  09/13/2012  *RADIOLOGY REPORT*  Clinical Data: Back pain  DG C-ARM GT 120 MIN,LUMBAR SPINE - 2-3 VIEW  Comparison:   05/17/2012.  Findings: C-arm films document L4-5 anterior lumbar interbody fusion, with L2-3 and L3-4  XLIF.  Right-sided pedicle screws have been placed from L2-L4 and connected by a single rod. The degree of asymmetric loss of interspace height at L2-3 on the left is improved compared with preoperative study  IMPRESSION: As above.   Original Report  Authenticated By: Davonna Belling, M.D.    Dg Or Local Abdomen  09/13/2012  *RADIOLOGY REPORT*  Clinical Data: Anterior lumbar fusion.  Left for retained instruments.  OR LOCAL ABDOMEN  Comparison: Radiographs dated 06/12/2012  Findings: Anterior plate and screws and interbody fusion device in place at L4-5.  No retained instruments.  Bowel gas pattern is normal.  Unchanged  rotoscoliosis of the lumbar spine centered at L2-3.  IMPRESSION: No retained instruments after anterior lumbar fusion at L4-5.   Original Report Authenticated By: Francene Boyers, M.D.     Assessment/Plan: Stable postop day 1  LOS: 1 day  Mobilizing well and tolerating oral pain medications. May benefit from some intermittent Toradol   [name] 09/14/2012, 12:55 PM

## 2012-09-14 NOTE — Progress Notes (Signed)
Orthopedic Tech Progress Note Patient Details:  [name] 31-Mar-1956 [id number]  Patient ID: [name], male   DOB: 04/05/1956, 57 y.o.   MRN: [id number] Brace order completed by Storm Frisk, Thelma Viana 09/14/2012, 3:35 PM

## 2012-09-14 NOTE — Plan of Care (Signed)
Problem: Consults Goal: Diagnosis - Spinal Surgery Thoraco/Lumbar Spine Fusion     

## 2012-09-14 NOTE — Progress Notes (Signed)
Subjective: Interval History: none. Up in chair. Complains of abdominal and back soreness. No nausea or vomiting  Objective: Vital signs in last 24 hours: Temp:  [97.4 F (36.3 C)-99.3 F (37.4 C)] 97.9 F (36.6 C) (02/01 1000) Pulse Rate:  [92-136] 100  (02/01 1000) Resp:  [11-20] 18  (02/01 1000) BP: (121-168)/(65-98) 133/65 mmHg (02/01 1000) SpO2:  [88 %-100 %] 95 % (02/01 1000) Weight:  [150 lb (68.04 kg)] 150 lb (68.04 kg) (01/31 2117)  Intake/Output from previous day: 01/31 0701 - 02/01 0700 In: 5850 [I.V.:5100; IV Piggyback:750] Out: 4765 [Urine:4565; Blood:200] Intake/Output this shift:    2+ dorsalis pedis pulses bilaterally. Abdomen soft mild left lower quadrant tenderness  Lab Results:  Basename 09/13/12 1117  WBC --  HGB 10.2*  HCT 30.0*  PLT --   BMET  Basename 09/13/12 1117  NA 139  K 4.1  CL --  CO2 --  GLUCOSE 112*  BUN --  CREATININE --  CALCIUM --    Studies/Results: Dg Lumbar Spine 2-3 Views  09/13/2012  *RADIOLOGY REPORT*  Clinical Data: Back pain  DG C-ARM GT 120 MIN,LUMBAR SPINE - 2-3 VIEW  Comparison:   05/17/2012.  Findings: C-arm films document L4-5 anterior lumbar interbody fusion, with L2-3 and L3-4  XLIF.  Right-sided pedicle screws have been placed from L2-L4 and connected by a single rod. The degree of asymmetric loss of interspace height at L2-3 on the left is improved compared with preoperative study  IMPRESSION: As above.   Original Report Authenticated By: Davonna Belling, M.D.    Dg C-arm Gt 120 Min  09/13/2012  *RADIOLOGY REPORT*  Clinical Data: Back pain  DG C-ARM GT 120 MIN,LUMBAR SPINE - 2-3 VIEW  Comparison:   05/17/2012.  Findings: C-arm films document L4-5 anterior lumbar interbody fusion, with L2-3 and L3-4  XLIF.  Right-sided pedicle screws have been placed from L2-L4 and connected by a single rod. The degree of asymmetric loss of interspace height at L2-3 on the left is improved compared with preoperative study  IMPRESSION: As  above.   Original Report Authenticated By: Davonna Belling, M.D.    Dg Or Local Abdomen  09/13/2012  *RADIOLOGY REPORT*  Clinical Data: Anterior lumbar fusion.  Left for retained instruments.  OR LOCAL ABDOMEN  Comparison: Radiographs dated 06/12/2012  Findings: Anterior plate and screws and interbody fusion device in place at L4-5.  No retained instruments.  Bowel gas pattern is normal.  Unchanged  rotoscoliosis of the lumbar spine centered at L2-3.  IMPRESSION: No retained instruments after anterior lumbar fusion at L4-5.   Original Report Authenticated By: Francene Boyers, M.D.    Anti-infectives: Anti-infectives     Start     Dose/Rate Route Frequency Ordered Stop   09/13/12 1900   ceFAZolin (ANCEF) IVPB 2 g/50 mL premix        2 g 100 mL/hr over 30 Minutes Intravenous Every 8 hours 09/13/12 1803 09/14/12 1036   09/13/12 1300   bacitracin 11914 UNITS injection     Comments: RATCLIFF, ESTHER: cabinet override         09/13/12 1300 09/14/12 0114   09/13/12 0750   bacitracin 50,000 Units in sodium chloride irrigation 0.9 % 500 mL irrigation  Status:  Discontinued          As needed 09/13/12 0854 09/13/12 1432   09/13/12 0704   bacitracin 78295 UNITS injection     Comments: RATCLIFF, ESTHER: cabinet override         09/13/12  1610 09/13/12 1914   09/13/12 0000   vancomycin (VANCOCIN) IVPB 1000 mg/200 mL premix        1,000 mg 200 mL/hr over 60 Minutes Intravenous  Once 09/12/12 1453 09/13/12 0800          Assessment/Plan: s/p Procedure(s) (LRB) with comments: ANTERIOR LUMBAR FUSION 1 LEVEL (N/A) - lumbar four-five ANTERIOR LATERAL LUMBAR FUSION 2 LEVELS (Right) - lumbar two three,three-four LUMBAR PERCUTANEOUS PEDICLE SCREW 2 LEVEL (Left) - left pedicle screws two -three,three-four ABDOMINAL EXPOSURE (N/A) Stable postop day 1 from L4-5 exposure forALIF. No evidence of complications. Will not follow actively. Please call if we can assist.   LOS: 1 day   [name] 09/14/2012, 11:01  AM

## 2012-09-15 NOTE — Progress Notes (Signed)
Patient ambulated 200 ft with brace and walker. Tolerated well.

## 2012-09-15 NOTE — Progress Notes (Signed)
Subjective: Patient reports Pain better controlled. Abdomen with some discomfort and mild distention patient reports passing gas.  Objective: Vital signs in last 24 hours: Temp:  [97.9 F (36.6 C)-98.8 F (37.1 C)] 97.9 F (36.6 C) (02/02 0534) Pulse Rate:  [99-105] 102  (02/02 0534) Resp:  [16-20] 16  (02/02 0534) BP: (117-151)/(61-72) 151/72 mmHg (02/02 0534) SpO2:  [92 %-96 %] 96 % (02/02 0534)  Intake/Output from previous day:   Intake/Output this shift: Total I/O In: 360 [P.O.:360] Out: -   Abdomen with mild distention. Bowel sounds are present. Motor function is good in lower tremors.  Lab Results:  Basename 09/13/12 1117  WBC --  HGB 10.2*  HCT 30.0*  PLT --   BMET  Basename 09/13/12 1117  NA 139  K 4.1  CL --  CO2 --  GLUCOSE 112*  BUN --  CREATININE --  CALCIUM --    Studies/Results: Dg Lumbar Spine 2-3 Views  09/13/2012  *RADIOLOGY REPORT*  Clinical Data: Back pain  DG C-ARM GT 120 MIN,LUMBAR SPINE - 2-3 VIEW  Comparison:   05/17/2012.  Findings: C-arm films document L4-5 anterior lumbar interbody fusion, with L2-3 and L3-4  XLIF.  Right-sided pedicle screws have been placed from L2-L4 and connected by a single rod. The degree of asymmetric loss of interspace height at L2-3 on the left is improved compared with preoperative study  IMPRESSION: As above.   Original Report Authenticated By: Davonna Belling, M.D.    Dg C-arm Gt 120 Min  09/13/2012  *RADIOLOGY REPORT*  Clinical Data: Back pain  DG C-ARM GT 120 MIN,LUMBAR SPINE - 2-3 VIEW  Comparison:   05/17/2012.  Findings: C-arm films document L4-5 anterior lumbar interbody fusion, with L2-3 and L3-4  XLIF.  Right-sided pedicle screws have been placed from L2-L4 and connected by a single rod. The degree of asymmetric loss of interspace height at L2-3 on the left is improved compared with preoperative study  IMPRESSION: As above.   Original Report Authenticated By: Davonna Belling, M.D.    Dg Or Local  Abdomen  09/13/2012  *RADIOLOGY REPORT*  Clinical Data: Anterior lumbar fusion.  Left for retained instruments.  OR LOCAL ABDOMEN  Comparison: Radiographs dated 06/12/2012  Findings: Anterior plate and screws and interbody fusion device in place at L4-5.  No retained instruments.  Bowel gas pattern is normal.  Unchanged  rotoscoliosis of the lumbar spine centered at L2-3.  IMPRESSION: No retained instruments after anterior lumbar fusion at L4-5.   Original Report Authenticated By: Francene Boyers, M.D.     Assessment/Plan: Stable day 2 postop encourage mobilization. May be ready for discharge in a.m.  LOS: 2 days  Discharge in a.m. if stable.   [name] 09/15/2012, 8:58 AM

## 2012-09-16 ENCOUNTER — Encounter (HOSPITAL_COMMUNITY): Payer: Self-pay | Admitting: Neurosurgery

## 2012-09-16 MED ORDER — HYDROMORPHONE HCL 4 MG PO TABS: 4.0000 mg | ORAL_TABLET | ORAL | Status: DC | PRN

## 2012-09-16 MED ORDER — CYCLOBENZAPRINE HCL 10 MG PO TABS: 10.0000 mg | ORAL_TABLET | Freq: Three times a day (TID) | ORAL | Status: DC | PRN

## 2012-09-16 NOTE — Plan of Care (Signed)
Problem: Discharge Progression Outcomes Goal: Barriers To Progression Addressed/Resolved Outcome: Adequate for Discharge Reviewed how to ambulate on stairs due to patient lives in apartment with approximately 30 steps.  Goal: Demonstrates proper use of assistive devices Outcome: Completed/Met Date Met:  09/16/12 Patient able to apply back brace independently

## 2012-09-16 NOTE — Progress Notes (Signed)
Patient discharged to home with family at 58 . Discharge instructions given and reviewed by RN. Patient verbalized understanding of discharge instructions . Reviewed with patient how to ambulate on stairs . incisions x 3 left abd . Right flank and lumbar region of back with dermabond intact . Continue with plan of  Care.     [name]

## 2012-09-16 NOTE — Progress Notes (Signed)
Patient continues to have severe pain on lower back. Tried multiple doses of  Dilaudid IM shots, Percocet, Vicodin and warm packs.

## 2012-09-16 NOTE — Discharge Summary (Signed)
Physician Discharge Summary  Patient ID: [name] MRN: [id number] DOB/AGE: August 15, 1955 57 y.o.  Admit date: 09/13/2012 Discharge date: 09/16/2012  Admission Diagnoses:  Discharge Diagnoses:  Principal Problem:  *Lumbar spondylosis L2-5   Discharged Condition: good  Hospital Course: Surgery Friday. Did well with large multi approach lumbar fusion. Increrased activity over weekend. Pain decreased, and ambulation increased. By POD 3, ambulating well, wound healing well. Neuro intact. Home with specific instructions.  Consults: None  Significant Diagnostic Studies: none  Treatments: surgery: L 45 alif: L23, L34 xlif with perc screws  Discharge Exam: Blood pressure 137/80, pulse 96, temperature 97.6 F (36.4 C), temperature source Oral, resp. rate 20, height 5\' 7"  (1.702 m), weight 68.04 kg (150 lb), SpO2 94.00%. Incision/Wound:all healing well  Disposition: Final discharge disposition not confirmed  Discharge Orders    Future Orders Please Complete By Expires   Diet general      Discharge instructions      Comments:   Mostly bedrest. Get up 9 or 10 times each day and walk for 15-20 minutes each time. Very little sitting the first week. No riding in the car until your first post op appointment. If you had neck surgery...may shower from the chest down. If you had low back surgery....you may shower with a saran wrap covering over the incision. Take your pain medicine as needed...and other medicines that you are instructed to take. Call for an appointment...[phone].   Call MD for:  temperature >100.4      Call MD for:  persistant nausea and vomiting      Call MD for:  severe uncontrolled pain      Call MD for:  redness, tenderness, or signs of infection (pain, swelling, redness, odor or green/yellow discharge around incision site)      Call MD for:  difficulty breathing, headache or visual disturbances      Call MD for:  hives          Medication List     As of 09/16/2012   9:04 AM    STOP taking these medications         HYDROcodone-acetaminophen 10-325 MG per tablet   Commonly known as: NORCO      ibuprofen 200 MG tablet   Commonly known as: ADVIL,MOTRIN      TAKE these medications         cyclobenzaprine 10 MG tablet   Commonly known as: FLEXERIL   Take 1 tablet (10 mg total) by mouth 3 (three) times daily as needed for muscle spasms.      folic acid 1 MG tablet   Commonly known as: FOLVITE   Take 1 mg by mouth daily.      HYDROmorphone 4 MG tablet   Commonly known as: DILAUDID   Take 1 tablet (4 mg total) by mouth every 4 (four) hours as needed for pain.      omeprazole 20 MG capsule   Commonly known as: PRILOSEC   Take 20 mg by mouth daily.      rOPINIRole 4 MG tablet   Commonly known as: REQUIP   Take 4 mg by mouth 3 (three) times daily.      [name]   Inject 1 application into the skin every 30 (thirty) days.      venlafaxine 75 MG tablet   Commonly known as: EFFEXOR   Take 75 mg by mouth daily.         At home rest most of the time. Get  up 9 or 10 times each day and take a 15 or 20 minute walk. No riding in the car and to your first postoperative appointment. If you have neck surgery you may shower from the chest down starting on the third postoperative day. If you had back surgery he may start showering on the third postoperative day with saran wrap wrapped around your incisional area 3 times. After the shower remove the saran wrap. Take pain medicine as needed and other medications as instructed. Call my office for an appointment.  SignedReinaldo Meeker, MD 09/16/2012, 9:04 AM

## 2012-09-18 MED FILL — Sodium Chloride IV Soln 0.9%: INTRAVENOUS | Qty: 1000 | Status: AC

## 2012-09-18 MED FILL — Heparin Sodium (Porcine) Inj 1000 Unit/ML: INTRAMUSCULAR | Qty: 30 | Status: AC

## 2014-08-18 ENCOUNTER — Other Ambulatory Visit: Payer: Self-pay | Admitting: Orthopedic Surgery

## 2014-08-18 DIAGNOSIS — M25562 Pain in left knee: Secondary | ICD-10-CM

## 2014-08-22 ENCOUNTER — Ambulatory Visit
Admission: RE | Admit: 2014-08-22 | Discharge: 2014-08-22 | Disposition: A | Discharge status: Home / Self Care | Payer: Self-pay | Source: Ambulatory Visit | Attending: Orthopedic Surgery | Admitting: Orthopedic Surgery

## 2014-08-22 DIAGNOSIS — M25562 Pain in left knee: Secondary | ICD-10-CM

## 2014-12-14 ENCOUNTER — Other Ambulatory Visit (HOSPITAL_COMMUNITY): Payer: Self-pay | Admitting: Orthopaedic Surgery

## 2014-12-14 NOTE — Progress Notes (Signed)
Called for orders surgery 12-25-14 pre op 12-15-14 Thanks

## 2014-12-14 NOTE — Patient Instructions (Addendum)
20 Franciscojavier Wronski  12/14/2014   Your procedure is scheduled on:   12-25-2014 Friday  Enter through [facility]  Entrance and follow signs to [facility]. Arrive at      0800  AM.  Call this number if you have problems the morning of surgery: [phone]  Or Presurgical Testing [phone].   For Living Will and/or Health Care Power Attorney Forms: please provide copy for your medical record,may bring AM of surgery(Forms should be already notarized -we do not provide this service).   Do not eat food/ or drink: After Midnight.      Take these medicines the morning of surgery with A SIP OF WATER: cymbalta, prilosec, requip, cipro   Do not wear jewelry, make-up or nail polish.  Do not wear deodorant, lotions, powders, or perfumes.   Do not shave legs and under arms- 48 hours(2 days) prior to first CHG shower.(Shaving face and neck okay.)  Do not bring valuables to the hospital.(Hospital is not responsible for lost valuables).  Contacts, dentures or removable bridgework, body piercing, hair pins may not be worn into surgery.  Leave suitcase in the car. After surgery it may be brought to your room.  For patients admitted to the hospital, checkout time is 11:00 AM the day of discharge.(Restricted visitors-Any Persons displaying flu-like symptoms or illness).      Please read over the following fact sheets that you were given: MRSA Information  Remember : Type/Screen "Blue armbands" - may not be removed once applied(would result in being retested AM of surgery, if removed).        [facility] - Preparing for Surgery Before surgery, you can play an important role.  Because skin is not sterile, your skin needs to be as free of germs as possible.  You can reduce the number of germs on your skin by washing with CHG (chlorahexidine gluconate) soap before surgery.  CHG is an antiseptic cleaner which kills germs and bonds with the skin to continue killing germs even after  washing. Please DO NOT use if you have an allergy to CHG or antibacterial soaps.  If your skin becomes reddened/irritated stop using the CHG and inform your nurse when you arrive at Short Stay. Do not shave (including legs and underarms) for at least 48 hours prior to the first CHG shower.  You may shave your face/neck. Please follow these instructions carefully:  1.  Shower with CHG Soap the night before surgery and the  morning of Surgery.  2.  If you choose to wash your hair, wash your hair first as usual with your  normal  shampoo.  3.  After you shampoo, rinse your hair and body thoroughly to remove the  shampoo.                            4.  Use CHG as you would any other liquid soap.  You can apply chg directly  to the skin and wash                       Gently with a scrungie or clean washcloth.  5.  Apply the CHG Soap to your body ONLY FROM THE NECK DOWN.   Do not use on face/ open                           Wound or open  sores. Avoid contact with eyes, ears mouth and genitals (private parts).                       Wash face,  Genitals (private parts) with your normal soap.             6.  Wash thoroughly, paying special attention to the area where your surgery  will be performed.  7.  Thoroughly rinse your body with warm water from the neck down.  8.  DO NOT shower/wash with your normal soap after using and rinsing off  the CHG Soap.                9.  Pat yourself dry with a clean towel.            10.  Wear clean pajamas.            11.  Place clean sheets on your bed the night of your first shower and do not  sleep with pets. Day of Surgery : Do not apply any lotions/deodorants the morning of surgery.  Please wear clean clothes to the hospital/surgery center.  FAILURE TO FOLLOW THESE INSTRUCTIONS MAY RESULT IN THE CANCELLATION OF YOUR SURGERY PATIENT SIGNATURE_________________________________  NURSE  SIGNATURE__________________________________  ________________________________________________________________________

## 2014-12-15 ENCOUNTER — Encounter (HOSPITAL_COMMUNITY)
Admission: RE | Admit: 2014-12-15 | Discharge: 2014-12-15 | Disposition: A | Discharge status: Home / Self Care | Payer: Medicare HMO | Source: Ambulatory Visit | Attending: Orthopaedic Surgery | Admitting: Orthopaedic Surgery

## 2014-12-15 ENCOUNTER — Encounter (HOSPITAL_COMMUNITY): Payer: Self-pay

## 2014-12-15 DIAGNOSIS — M1710 Unilateral primary osteoarthritis, unspecified knee: Secondary | ICD-10-CM | POA: Diagnosis not present

## 2014-12-15 DIAGNOSIS — Z01812 Encounter for preprocedural laboratory examination: Secondary | ICD-10-CM | POA: Diagnosis present

## 2014-12-15 HISTORY — DX: Unspecified cataract: H26.9

## 2014-12-15 HISTORY — DX: Headache: R51

## 2014-12-15 HISTORY — DX: Headache, unspecified: R51.9

## 2014-12-15 HISTORY — DX: Personal history of urinary calculi: Z87.442

## 2014-12-15 HISTORY — DX: Restless legs syndrome: G25.81

## 2014-12-15 HISTORY — DX: Anxiety disorder, unspecified: F41.9

## 2014-12-15 LAB — CBC
HEMATOCRIT: 41.4 % (ref 39.0–52.0)
HEMOGLOBIN: 14.2 g/dL (ref 13.0–17.0)
MCH: 32.2 pg (ref 26.0–34.0)
MCHC: 34.3 g/dL (ref 30.0–36.0)
MCV: 93.9 fL (ref 78.0–100.0)
Platelets: 204 10*3/uL (ref 150–400)
RBC: 4.41 MIL/uL (ref 4.22–5.81)
RDW: 13 % (ref 11.5–15.5)
WBC: 5.3 10*3/uL (ref 4.0–10.5)

## 2014-12-15 LAB — BASIC METABOLIC PANEL
ANION GAP: 8 (ref 5–15)
BUN: 24 mg/dL — AB (ref 6–20)
CHLORIDE: 104 mmol/L (ref 101–111)
CO2: 26 mmol/L (ref 22–32)
CREATININE: 0.79 mg/dL (ref 0.61–1.24)
Calcium: 9.1 mg/dL (ref 8.9–10.3)
GFR calc non Af Amer: >60 mL/min (ref >60)
Glucose, Bld: 103 mg/dL — ABNORMAL HIGH (ref 70–99)
POTASSIUM: 4.2 mmol/L (ref 3.5–5.1)
Sodium: 138 mmol/L (ref 135–145)

## 2014-12-15 LAB — PROTIME-INR
INR: 1.02 (ref 0.00–1.49)
Prothrombin Time: 13.5 seconds (ref 11.6–15.2)

## 2014-12-15 LAB — SURGICAL PCR SCREEN
MRSA, PCR: NEGATIVE
STAPHYLOCOCCUS AUREUS: NEGATIVE

## 2014-12-15 LAB — APTT: aPTT: 32 seconds (ref 24–37)

## 2014-12-15 NOTE — Progress Notes (Signed)
LOV note 10/16/14 Dr. Dora Sims on chart, EKG 05/09/14 on chart

## 2014-12-25 ENCOUNTER — Encounter (HOSPITAL_COMMUNITY): Payer: Self-pay | Admitting: Anesthesiology

## 2014-12-25 ENCOUNTER — Ambulatory Visit (HOSPITAL_COMMUNITY)
Admission: RE | Admit: 2014-12-25 | Discharge: 2014-12-25 | Disposition: A | Discharge status: Home / Self Care | Payer: Medicare HMO | Source: Ambulatory Visit | Attending: Orthopaedic Surgery | Admitting: Orthopaedic Surgery

## 2014-12-25 ENCOUNTER — Encounter (HOSPITAL_COMMUNITY)
Admission: RE | Disposition: A | Discharge status: Home / Self Care | Payer: Self-pay | Source: Ambulatory Visit | Attending: Orthopaedic Surgery

## 2014-12-25 DIAGNOSIS — Z5309 Procedure and treatment not carried out because of other contraindication: Secondary | ICD-10-CM | POA: Insufficient documentation

## 2014-12-25 DIAGNOSIS — M179 Osteoarthritis of knee, unspecified: Secondary | ICD-10-CM | POA: Insufficient documentation

## 2014-12-25 DIAGNOSIS — K219 Gastro-esophageal reflux disease without esophagitis: Secondary | ICD-10-CM | POA: Diagnosis not present

## 2014-12-25 DIAGNOSIS — R21 Rash and other nonspecific skin eruption: Secondary | ICD-10-CM | POA: Diagnosis not present

## 2014-12-25 DIAGNOSIS — Z96641 Presence of right artificial hip joint: Secondary | ICD-10-CM | POA: Insufficient documentation

## 2014-12-25 DIAGNOSIS — M069 Rheumatoid arthritis, unspecified: Secondary | ICD-10-CM | POA: Insufficient documentation

## 2014-12-25 DIAGNOSIS — M25562 Pain in left knee: Secondary | ICD-10-CM | POA: Diagnosis present

## 2014-12-25 DIAGNOSIS — M1712 Unilateral primary osteoarthritis, left knee: Secondary | ICD-10-CM

## 2014-12-25 SURGERY — ARTHROPLASTY, KNEE, TOTAL
Anesthesia: General | Site: Knee | Laterality: Left

## 2014-12-25 MED ORDER — CLINDAMYCIN PHOSPHATE 900 MG/50ML IV SOLN: 900.0000 mg | INTRAVENOUS | Status: DC

## 2014-12-25 MED ORDER — LACTATED RINGERS IV SOLN: INTRAVENOUS | Status: DC

## 2014-12-25 NOTE — Progress Notes (Signed)
Dr. Magnus Ivan in to see patient.  Surgery cancelled due to whelps on his Lt knee. Rx  Given to patient by Dr. Magnus Ivan. Patient discharged by ambulatory. He knows office will call him to reschedule his surgery.

## 2014-12-25 NOTE — Progress Notes (Signed)
inflammed whelps left knee. Patient states it is not painful or itchy. It appeared 3 days ago.  Dr. Magnus Ivan notified and will come see patient

## 2014-12-25 NOTE — H&P (Signed)
  I assessed [name]'s operative left knee and he has a rash directly over the knee that is red.  Given is rheumatoid disease and decreased immune status, I fell the need to cancel his surgery for today.  He understands this fully.  I will start an oral antibiotic and topical medicine and see him in the office next week.  Once the rash clears up, we can reschedule his surgery.

## 2014-12-25 NOTE — H&P (Signed)
TOTAL KNEE ADMISSION H&P  Patient is being admitted for left total knee arthroplasty.  Subjective:  Chief Complaint:left knee pain.  HPI: [name], 59 y.o. male, has a history of pain and functional disability in the left knee due to arthritis and has failed non-surgical conservative treatments for greater than 12 weeks to includeNSAID's and/or analgesics, corticosteriod injections, viscosupplementation injections, flexibility and strengthening excercises and activity modification.  Onset of symptoms was gradual, starting 2 years ago with gradually worsening course since that time. The patient noted prior procedures on the knee to include  arthroscopy on the left knee(s).  Patient currently rates pain in the left knee(s) at 10 out of 10 with activity. Patient has night pain, worsening of pain with activity and weight bearing, pain that interferes with activities of daily living, pain with passive range of motion and joint swelling.  Patient has evidence of joint space narrowing by imaging studies. There is no active infection.  Patient Active Problem List   Diagnosis Date Noted  . Osteoarthritis of left knee 12/25/2014  . Lumbar spondylosis L2-5 09/14/2012   Past Medical History  Diagnosis Date  . Depression   . OCD (obsessive compulsive disorder)   . GERD (gastroesophageal reflux disease)   . Rheumatoid arthritis(714.0)     sees Dr Sharmon Revere @ [facility] in [place]  . Anxiety   . History of kidney stones     x1 15 years ago  . Headache     hx of migraines-none recent  . Restless leg syndrome   . Cataract     left eye    Past Surgical History  Procedure Laterality Date  . Total hip arthroplasty Right 01/2012    done in [facility]  . Hand tendon surgery Right 06/2012    done @ [facility] in [place]  . Rotator cuff repair Right 2008     done in [facility]  . Anterior lumbar fusion  09/13/2012    Procedure: ANTERIOR LUMBAR FUSION 1 LEVEL;  Surgeon: Reinaldo Meeker, MD;  Location:  [facility] NEUR[facility] OR[facility];  Service: Neurosurgery;  Laterality: N/A;  lumbar four-five  . Anterior lat lumbar fusion  09/13/2012    Procedure: ANTERIOR LATERAL LUMBAR FUSION 2 LEVELS;  Surgeon: Reinaldo Meeker, MD;  Location: [facility] NEUR[facility] ORS;  Service: Neurosurgery;  Laterality: Right;  lumbar two three,three-four  . Lumbar percutaneous pedicle screw 2 level  09/13/2012    Procedure: LUMBAR PERCUTANEOUS PEDICLE SCREW 2 LEVEL;  Surgeon: Reinaldo Meeker, MD;  Location: [facility] NEUR[facility] OR[facility];  Service: Neurosurgery;  Laterality: Left;  left pedicle screws two -three,three-four  . Abdominal exposure  09/13/2012    Procedure: ABDOMINAL EXPOSURE;  Surgeon: Larina Earthly, MD;  Location: [facility] NEUR[facility] OR[facility];  Service: Vascular;  Laterality: N/A;    No prescriptions prior to admission   Allergies  Allergen Reactions  . Penicillins Rash    History  Substance Use Topics  . Smoking status: Never Smoker   . Smokeless tobacco: Never Used  . Alcohol Use: Yes     Comment: Occasional     No family history on file.   Review of Systems  Musculoskeletal: Positive for joint pain.  All other systems reviewed and are negative.   Objective:  Physical Exam  Constitutional: He is oriented to person, place, and time. He appears well-developed and well-nourished.  HENT:  Head: Normocephalic and atraumatic.  Eyes: EOM are normal. Pupils are equal, round, and reactive to light.  Neck: Normal range of motion. Neck supple.  Cardiovascular: Normal rate and regular rhythm.   Respiratory: Effort normal and breath sounds normal.  GI: Soft. Bowel sounds are normal.  Musculoskeletal:       Left knee: He exhibits swelling. Tenderness found. Medial joint line tenderness noted.  Neurological: He is alert and oriented to person, place, and time.  Skin: Skin is warm and dry.  Psychiatric: He has a normal mood and affect.    Vital signs in last 24 hours:    Labs:   Estimated body mass index is 25.21 kg/(m^2) as calculated from the following:    Height as of 09/13/12: 5\' 7"  (1.702 m).   Weight as of 09/10/12: 73.029 kg (161 lb).   Imaging Review Plain radiographs demonstrate moderate degenerative joint disease of the left knee(s). The overall alignment ismild varus. The bone quality appears to be good for age and reported activity level.  Assessment/Plan:  End stage arthritis, left knee   The patient history, physical examination, clinical judgment of the provider and imaging studies are consistent with end stage degenerative joint disease of the left knee(s) and total knee arthroplasty is deemed medically necessary. The treatment options including medical management, injection therapy arthroscopy and arthroplasty were discussed at length. The risks and benefits of total knee arthroplasty were presented and reviewed. The risks due to aseptic loosening, infection, stiffness, patella tracking problems, thromboembolic complications and other imponderables were discussed. The patient acknowledged the explanation, agreed to proceed with the plan and consent was signed. Patient is being admitted for inpatient treatment for surgery, pain control, PT, OT, prophylactic antibiotics, VTE prophylaxis, progressive ambulation and ADL's and discharge planning. The patient is planning to be discharged home with home health services

## 2014-12-30 ENCOUNTER — Encounter (HOSPITAL_COMMUNITY): Payer: Self-pay | Admitting: *Deleted

## 2015-01-05 ENCOUNTER — Other Ambulatory Visit (HOSPITAL_COMMUNITY): Payer: Self-pay | Admitting: Orthopaedic Surgery

## 2015-01-08 ENCOUNTER — Inpatient Hospital Stay (HOSPITAL_COMMUNITY): Payer: Medicare HMO | Admitting: Certified Registered Nurse Anesthetist

## 2015-01-08 ENCOUNTER — Inpatient Hospital Stay (HOSPITAL_COMMUNITY): Payer: Medicare HMO

## 2015-01-08 ENCOUNTER — Inpatient Hospital Stay (HOSPITAL_COMMUNITY)
Admission: RE | Admit: 2015-01-08 | Discharge: 2015-01-11 | DRG: 470 | Disposition: A | Discharge status: Home Health | Payer: Medicare HMO | Source: Ambulatory Visit | Attending: Orthopaedic Surgery | Admitting: Orthopaedic Surgery

## 2015-01-08 ENCOUNTER — Encounter (HOSPITAL_COMMUNITY): Payer: Self-pay | Admitting: Anesthesiology

## 2015-01-08 ENCOUNTER — Encounter (HOSPITAL_COMMUNITY)
Admission: RE | Disposition: A | Discharge status: Home Health | Payer: Self-pay | Source: Ambulatory Visit | Attending: Orthopaedic Surgery

## 2015-01-08 DIAGNOSIS — G2581 Restless legs syndrome: Secondary | ICD-10-CM | POA: Diagnosis present

## 2015-01-08 DIAGNOSIS — M1712 Unilateral primary osteoarthritis, left knee: Principal | ICD-10-CM | POA: Diagnosis present

## 2015-01-08 DIAGNOSIS — M069 Rheumatoid arthritis, unspecified: Secondary | ICD-10-CM | POA: Diagnosis present

## 2015-01-08 DIAGNOSIS — Z87442 Personal history of urinary calculi: Secondary | ICD-10-CM | POA: Diagnosis not present

## 2015-01-08 DIAGNOSIS — Z96652 Presence of left artificial knee joint: Secondary | ICD-10-CM

## 2015-01-08 DIAGNOSIS — Z01812 Encounter for preprocedural laboratory examination: Secondary | ICD-10-CM | POA: Diagnosis not present

## 2015-01-08 DIAGNOSIS — K219 Gastro-esophageal reflux disease without esophagitis: Secondary | ICD-10-CM | POA: Diagnosis present

## 2015-01-08 DIAGNOSIS — M25562 Pain in left knee: Secondary | ICD-10-CM | POA: Diagnosis present

## 2015-01-08 HISTORY — PX: TOTAL KNEE ARTHROPLASTY: SHX125

## 2015-01-08 LAB — TYPE AND SCREEN
ABO/RH(D): A POS
ANTIBODY SCREEN: NEGATIVE

## 2015-01-08 LAB — ABO/RH: ABO/RH(D): A POS

## 2015-01-08 SURGERY — ARTHROPLASTY, KNEE, TOTAL
Anesthesia: General | Site: Knee | Laterality: Left

## 2015-01-08 MED ORDER — ROPIVACAINE HCL 5 MG/ML IJ SOLN
INTRAMUSCULAR | Status: DC | PRN
  Administered 2015-01-08: 20 mL via PERINEURAL

## 2015-01-08 MED ORDER — EPHEDRINE SULFATE 50 MG/ML IJ SOLN
INTRAMUSCULAR | Status: DC | PRN
  Administered 2015-01-08: 10 mg via INTRAVENOUS
  Administered 2015-01-08: 5 mg via INTRAVENOUS
  Administered 2015-01-08: 10 mg via INTRAVENOUS
  Administered 2015-01-08: 15 mg via INTRAVENOUS

## 2015-01-08 MED ORDER — DULOXETINE HCL 60 MG PO CPEP
60.0000 mg | ORAL_CAPSULE | Freq: Every morning | ORAL | Status: DC
  Administered 2015-01-09 – 2015-01-11 (×3): 60 mg via ORAL
  Filled 2015-01-08 (×3): qty 1

## 2015-01-08 MED ORDER — ROCURONIUM BROMIDE 100 MG/10ML IV SOLN
INTRAVENOUS | Status: DC | PRN
Start: 2015-01-08 — End: 2015-01-08
  Administered 2015-01-08: 30 mg via INTRAVENOUS

## 2015-01-08 MED ORDER — LACTATED RINGERS IV SOLN
INTRAVENOUS | Status: DC
  Administered 2015-01-08: 1000 mL via INTRAVENOUS
  Administered 2015-01-08: 11:00:00 via INTRAVENOUS

## 2015-01-08 MED ORDER — ACETAMINOPHEN 650 MG RE SUPP: 650.0000 mg | Freq: Four times a day (QID) | RECTAL | Status: DC | PRN

## 2015-01-08 MED ORDER — METOCLOPRAMIDE HCL 10 MG PO TABS: 5.0000 mg | ORAL_TABLET | Freq: Three times a day (TID) | ORAL | Status: DC | PRN

## 2015-01-08 MED ORDER — METHOCARBAMOL 500 MG PO TABS
500.0000 mg | ORAL_TABLET | Freq: Four times a day (QID) | ORAL | Status: DC | PRN
  Administered 2015-01-08 – 2015-01-10 (×5): 500 mg via ORAL
  Filled 2015-01-08 (×5): qty 1

## 2015-01-08 MED ORDER — PANTOPRAZOLE SODIUM 40 MG PO TBEC
80.0000 mg | DELAYED_RELEASE_TABLET | Freq: Every day | ORAL | Status: DC
  Administered 2015-01-09 – 2015-01-11 (×3): 80 mg via ORAL
  Filled 2015-01-08 (×5): qty 2

## 2015-01-08 MED ORDER — METHOCARBAMOL 1000 MG/10ML IJ SOLN
500.0000 mg | Freq: Four times a day (QID) | INTRAVENOUS | Status: DC | PRN
  Administered 2015-01-08: 500 mg via INTRAVENOUS
  Filled 2015-01-08 (×2): qty 5

## 2015-01-08 MED ORDER — ONDANSETRON HCL 4 MG PO TABS: 4.0000 mg | ORAL_TABLET | Freq: Four times a day (QID) | ORAL | Status: DC | PRN

## 2015-01-08 MED ORDER — HYDROMORPHONE HCL 1 MG/ML IJ SOLN
1.0000 mg | INTRAMUSCULAR | Status: DC | PRN
  Administered 2015-01-08 – 2015-01-09 (×8): 1 mg via INTRAVENOUS
  Filled 2015-01-08 (×8): qty 1

## 2015-01-08 MED ORDER — MIDAZOLAM HCL 2 MG/2ML IJ SOLN
INTRAMUSCULAR | Status: AC
  Filled 2015-01-08: qty 2

## 2015-01-08 MED ORDER — SUCCINYLCHOLINE CHLORIDE 20 MG/ML IJ SOLN
INTRAMUSCULAR | Status: DC | PRN
  Administered 2015-01-08: 100 mg via INTRAVENOUS

## 2015-01-08 MED ORDER — CLINDAMYCIN PHOSPHATE 900 MG/50ML IV SOLN
900.0000 mg | Freq: Once | INTRAVENOUS | Status: AC
  Administered 2015-01-08: 900 mg via INTRAVENOUS

## 2015-01-08 MED ORDER — PHENOL 1.4 % MT LIQD
1.0000 | OROMUCOSAL | Status: DC | PRN
  Filled 2015-01-08: qty 177

## 2015-01-08 MED ORDER — NEOSTIGMINE METHYLSULFATE 10 MG/10ML IV SOLN
INTRAVENOUS | Status: AC
  Filled 2015-01-08: qty 1

## 2015-01-08 MED ORDER — FENTANYL CITRATE (PF) 100 MCG/2ML IJ SOLN
INTRAMUSCULAR | Status: DC | PRN
Start: 2015-01-08 — End: 2015-01-08
  Administered 2015-01-08: 50 ug via INTRAVENOUS
  Administered 2015-01-08: 100 ug via INTRAVENOUS
  Administered 2015-01-08 (×2): 50 ug via INTRAVENOUS

## 2015-01-08 MED ORDER — PHENYLEPHRINE 40 MCG/ML (10ML) SYRINGE FOR IV PUSH (FOR BLOOD PRESSURE SUPPORT)
PREFILLED_SYRINGE | INTRAVENOUS | Status: AC
  Filled 2015-01-08: qty 10

## 2015-01-08 MED ORDER — HYDROMORPHONE HCL 1 MG/ML IJ SOLN
INTRAMUSCULAR | Status: AC
  Filled 2015-01-08: qty 1

## 2015-01-08 MED ORDER — RIVAROXABAN 10 MG PO TABS
10.0000 mg | ORAL_TABLET | Freq: Every day | ORAL | Status: DC
  Administered 2015-01-09 – 2015-01-11 (×3): 10 mg via ORAL
  Filled 2015-01-08 (×5): qty 1

## 2015-01-08 MED ORDER — SODIUM CHLORIDE 0.9 % IV SOLN
INTRAVENOUS | Status: DC
  Administered 2015-01-08 – 2015-01-09 (×2): via INTRAVENOUS

## 2015-01-08 MED ORDER — TAMSULOSIN HCL 0.4 MG PO CAPS
0.4000 mg | ORAL_CAPSULE | Freq: Two times a day (BID) | ORAL | Status: DC
  Administered 2015-01-08 – 2015-01-11 (×7): 0.4 mg via ORAL
  Filled 2015-01-08 (×9): qty 1

## 2015-01-08 MED ORDER — FOLIC ACID 1 MG PO TABS
1.0000 mg | ORAL_TABLET | Freq: Every day | ORAL | Status: DC
  Administered 2015-01-08 – 2015-01-10 (×3): 1 mg via ORAL
  Filled 2015-01-08 (×5): qty 1

## 2015-01-08 MED ORDER — ACETAMINOPHEN 10 MG/ML IV SOLN
1000.0000 mg | Freq: Once | INTRAVENOUS | Status: AC
  Administered 2015-01-08: 1000 mg via INTRAVENOUS
  Filled 2015-01-08: qty 100

## 2015-01-08 MED ORDER — FENTANYL CITRATE (PF) 250 MCG/5ML IJ SOLN
INTRAMUSCULAR | Status: AC
  Filled 2015-01-08: qty 5

## 2015-01-08 MED ORDER — LIDOCAINE HCL (CARDIAC) 20 MG/ML IV SOLN
INTRAVENOUS | Status: DC | PRN
  Administered 2015-01-08: 80 mg via INTRAVENOUS

## 2015-01-08 MED ORDER — ONDANSETRON HCL 4 MG/2ML IJ SOLN
INTRAMUSCULAR | Status: AC
  Filled 2015-01-08: qty 2

## 2015-01-08 MED ORDER — ACETAMINOPHEN 325 MG PO TABS: 650.0000 mg | ORAL_TABLET | Freq: Four times a day (QID) | ORAL | Status: DC | PRN

## 2015-01-08 MED ORDER — ALUM & MAG HYDROXIDE-SIMETH 200-200-20 MG/5ML PO SUSP: 30.0000 mL | ORAL | Status: DC | PRN

## 2015-01-08 MED ORDER — ZOLPIDEM TARTRATE 5 MG PO TABS
5.0000 mg | ORAL_TABLET | Freq: Every evening | ORAL | Status: DC | PRN
  Administered 2015-01-08 – 2015-01-10 (×2): 5 mg via ORAL
  Filled 2015-01-08 (×2): qty 1

## 2015-01-08 MED ORDER — DEXAMETHASONE SODIUM PHOSPHATE 10 MG/ML IJ SOLN
INTRAMUSCULAR | Status: DC | PRN
Start: 2015-01-08 — End: 2015-01-08
  Administered 2015-01-08: 10 mg via INTRAVENOUS

## 2015-01-08 MED ORDER — OXYCODONE HCL 5 MG PO TABS
5.0000 mg | ORAL_TABLET | ORAL | Status: DC | PRN
  Administered 2015-01-08: 15 mg via ORAL
  Administered 2015-01-08: 10 mg via ORAL
  Administered 2015-01-08 – 2015-01-11 (×13): 15 mg via ORAL
  Filled 2015-01-08 (×4): qty 3
  Filled 2015-01-08: qty 2
  Filled 2015-01-08 (×10): qty 3

## 2015-01-08 MED ORDER — ROPIVACAINE HCL 5 MG/ML IJ SOLN
INTRAMUSCULAR | Status: AC
  Filled 2015-01-08: qty 30

## 2015-01-08 MED ORDER — LIDOCAINE HCL (CARDIAC) 20 MG/ML IV SOLN
INTRAVENOUS | Status: AC
  Filled 2015-01-08: qty 5

## 2015-01-08 MED ORDER — PROPOFOL 10 MG/ML IV BOLUS
INTRAVENOUS | Status: AC
  Filled 2015-01-08: qty 20

## 2015-01-08 MED ORDER — GLYCOPYRROLATE 0.2 MG/ML IJ SOLN
INTRAMUSCULAR | Status: AC
  Filled 2015-01-08: qty 3

## 2015-01-08 MED ORDER — CLINDAMYCIN PHOSPHATE 900 MG/50ML IV SOLN
INTRAVENOUS | Status: AC
  Filled 2015-01-08: qty 50

## 2015-01-08 MED ORDER — ROPINIROLE HCL 1 MG PO TABS
5.0000 mg | ORAL_TABLET | Freq: Three times a day (TID) | ORAL | Status: DC
  Administered 2015-01-08 – 2015-01-11 (×9): 5 mg via ORAL
  Filled 2015-01-08 (×12): qty 5

## 2015-01-08 MED ORDER — OXYCODONE HCL ER 10 MG PO T12A
10.0000 mg | EXTENDED_RELEASE_TABLET | Freq: Two times a day (BID) | ORAL | Status: DC
  Administered 2015-01-08 – 2015-01-10 (×5): 10 mg via ORAL
  Filled 2015-01-08 (×5): qty 1

## 2015-01-08 MED ORDER — TRANEXAMIC ACID 1000 MG/10ML IV SOLN
1000.0000 mg | INTRAVENOUS | Status: AC
  Administered 2015-01-08: 1000 mg via INTRAVENOUS
  Filled 2015-01-08: qty 10

## 2015-01-08 MED ORDER — CLINDAMYCIN PHOSPHATE 600 MG/50ML IV SOLN
600.0000 mg | Freq: Four times a day (QID) | INTRAVENOUS | Status: AC
  Administered 2015-01-08 (×2): 600 mg via INTRAVENOUS
  Filled 2015-01-08 (×3): qty 50

## 2015-01-08 MED ORDER — LEFLUNOMIDE 20 MG PO TABS
10.0000 mg | ORAL_TABLET | Freq: Every day | ORAL | Status: DC
  Administered 2015-01-08 – 2015-01-10 (×3): 10 mg via ORAL
  Filled 2015-01-08 (×5): qty 1

## 2015-01-08 MED ORDER — DIPHENHYDRAMINE HCL 12.5 MG/5ML PO ELIX: 12.5000 mg | ORAL_SOLUTION | ORAL | Status: DC | PRN

## 2015-01-08 MED ORDER — NEOSTIGMINE METHYLSULFATE 10 MG/10ML IV SOLN
INTRAVENOUS | Status: DC | PRN
  Administered 2015-01-08: 3 mg via INTRAVENOUS

## 2015-01-08 MED ORDER — METOCLOPRAMIDE HCL 5 MG/ML IJ SOLN: 5.0000 mg | Freq: Three times a day (TID) | INTRAMUSCULAR | Status: DC | PRN

## 2015-01-08 MED ORDER — SODIUM CHLORIDE 0.9 % IR SOLN
Status: DC | PRN
  Administered 2015-01-08: 2000 mL

## 2015-01-08 MED ORDER — GLYCOPYRROLATE 0.2 MG/ML IJ SOLN
INTRAMUSCULAR | Status: DC | PRN
  Administered 2015-01-08: 0.4 mg via INTRAVENOUS

## 2015-01-08 MED ORDER — PROPOFOL 10 MG/ML IV BOLUS
INTRAVENOUS | Status: DC | PRN
  Administered 2015-01-08: 150 mg via INTRAVENOUS

## 2015-01-08 MED ORDER — METHYLPHENIDATE HCL 5 MG PO TABS
10.0000 mg | ORAL_TABLET | ORAL | Status: DC
  Administered 2015-01-08 – 2015-01-11 (×6): 10 mg via ORAL
  Filled 2015-01-08 (×6): qty 2

## 2015-01-08 MED ORDER — HYDROMORPHONE HCL 1 MG/ML IJ SOLN
1.0000 mg | INTRAMUSCULAR | Status: DC | PRN
  Administered 2015-01-08 (×2): 1 mg via INTRAVENOUS
  Filled 2015-01-08 (×2): qty 1

## 2015-01-08 MED ORDER — PHENYLEPHRINE HCL 10 MG/ML IJ SOLN
INTRAMUSCULAR | Status: DC | PRN
Start: 2015-01-08 — End: 2015-01-08
  Administered 2015-01-08: 120 ug via INTRAVENOUS
  Administered 2015-01-08: 80 ug via INTRAVENOUS
  Administered 2015-01-08: 120 ug via INTRAVENOUS
  Administered 2015-01-08 (×2): 80 ug via INTRAVENOUS

## 2015-01-08 MED ORDER — ROCURONIUM BROMIDE 100 MG/10ML IV SOLN
INTRAVENOUS | Status: AC
Start: 2015-01-08 — End: 2015-01-08
  Filled 2015-01-08: qty 1

## 2015-01-08 MED ORDER — ONDANSETRON HCL 4 MG/2ML IJ SOLN
INTRAMUSCULAR | Status: DC | PRN
  Administered 2015-01-08: 4 mg via INTRAVENOUS

## 2015-01-08 MED ORDER — MENTHOL 3 MG MT LOZG
1.0000 | LOZENGE | OROMUCOSAL | Status: DC | PRN
  Filled 2015-01-08: qty 9

## 2015-01-08 MED ORDER — DOCUSATE SODIUM 100 MG PO CAPS
100.0000 mg | ORAL_CAPSULE | Freq: Two times a day (BID) | ORAL | Status: DC
  Administered 2015-01-08 – 2015-01-11 (×6): 100 mg via ORAL
  Filled 2015-01-08 (×9): qty 1

## 2015-01-08 MED ORDER — ONDANSETRON HCL 4 MG/2ML IJ SOLN
4.0000 mg | Freq: Four times a day (QID) | INTRAMUSCULAR | Status: DC | PRN
  Administered 2015-01-08: 4 mg via INTRAVENOUS
  Filled 2015-01-08: qty 2

## 2015-01-08 MED ORDER — KETOROLAC TROMETHAMINE 15 MG/ML IJ SOLN
15.0000 mg | Freq: Once | INTRAMUSCULAR | Status: AC
  Administered 2015-01-08: 15 mg via INTRAVENOUS
  Filled 2015-01-08: qty 1

## 2015-01-08 MED ORDER — HYDROMORPHONE HCL 1 MG/ML IJ SOLN
0.2500 mg | INTRAMUSCULAR | Status: DC | PRN
  Administered 2015-01-08 (×5): 0.5 mg via INTRAVENOUS

## 2015-01-08 MED ORDER — MIDAZOLAM HCL 5 MG/5ML IJ SOLN
INTRAMUSCULAR | Status: DC | PRN
  Administered 2015-01-08: 2 mg via INTRAVENOUS

## 2015-01-08 MED ORDER — EPHEDRINE SULFATE 50 MG/ML IJ SOLN
INTRAMUSCULAR | Status: AC
  Filled 2015-01-08: qty 1

## 2015-01-08 MED ORDER — SODIUM CHLORIDE 0.9 % IJ SOLN
INTRAMUSCULAR | Status: AC
  Filled 2015-01-08: qty 10

## 2015-01-08 SURGICAL SUPPLY — 60 items
APL SKNCLS STERI-STRIP NONHPOA (GAUZE/BANDAGES/DRESSINGS) ×1
BAG SPEC THK2 15X12 ZIP CLS (MISCELLANEOUS)
BAG ZIPLOCK 12X15 (MISCELLANEOUS) IMPLANT
BANDAGE ELASTIC 6 VELCRO ST LF (GAUZE/BANDAGES/DRESSINGS) ×3 IMPLANT
BANDAGE ESMARK 6X9 LF (GAUZE/BANDAGES/DRESSINGS) ×1 IMPLANT
BENZOIN TINCTURE PRP APPL 2/3 (GAUZE/BANDAGES/DRESSINGS) ×2 IMPLANT
BLADE SAG 13.0X1.37X90 (BLADE) IMPLANT
BLADE SAG 18X100X1.27 (BLADE) ×2 IMPLANT
BNDG CMPR 9X6 STRL LF SNTH (GAUZE/BANDAGES/DRESSINGS) ×1
BNDG ESMARK 6X9 LF (GAUZE/BANDAGES/DRESSINGS) ×3
BOWL SMART MIX CTS (DISPOSABLE) ×3 IMPLANT
CAPT KNEE TOTAL 3 ×3 IMPLANT
CEMENT BONE 1-PACK (Cement) ×6 IMPLANT
CLOSURE WOUND 1/2 X4 (GAUZE/BANDAGES/DRESSINGS) ×1
CUFF TOURN SGL QUICK 34 (TOURNIQUET CUFF) ×3
CUFF TRNQT CYL 34X4X40X1 (TOURNIQUET CUFF) ×1 IMPLANT
DRAPE EXTREMITY T 121X128X90 (DRAPE) ×3 IMPLANT
DRAPE POUCH INSTRU U-SHP 10X18 (DRAPES) ×3 IMPLANT
DRAPE SHEET LG 3/4 BI-LAMINATE (DRAPES) IMPLANT
DRAPE U-SHAPE 47X51 STRL (DRAPES) ×3 IMPLANT
DRSG AQUACEL AG ADV 3.5X10 (GAUZE/BANDAGES/DRESSINGS) ×1 IMPLANT
DRSG PAD ABDOMINAL 8X10 ST (GAUZE/BANDAGES/DRESSINGS) ×5 IMPLANT
DRSG XEROFORM 1X8 (GAUZE/BANDAGES/DRESSINGS) ×2 IMPLANT
DURAPREP 26ML APPLICATOR (WOUND CARE) ×3 IMPLANT
ELECT REM PT RETURN 9FT ADLT (ELECTROSURGICAL) ×3
ELECTRODE REM PT RTRN 9FT ADLT (ELECTROSURGICAL) ×1 IMPLANT
FACESHIELD WRAPAROUND (MASK) ×21 IMPLANT
GAUZE SPONGE 4X4 12PLY STRL (GAUZE/BANDAGES/DRESSINGS) ×3 IMPLANT
GAUZE XEROFORM 1X8 LF (GAUZE/BANDAGES/DRESSINGS) IMPLANT
GLOVE BIO SURGEON STRL SZ7.5 (GLOVE) ×3 IMPLANT
GLOVE BIOGEL PI IND STRL 8 (GLOVE) ×2 IMPLANT
GLOVE BIOGEL PI INDICATOR 8 (GLOVE) ×6
GLOVE ECLIPSE 8.0 STRL XLNG CF (GLOVE) ×3 IMPLANT
GOWN STRL REUS W/TWL XL LVL3 (GOWN DISPOSABLE) ×12 IMPLANT
HANDPIECE INTERPULSE COAX TIP (DISPOSABLE) ×3
IMMOBILIZER KNEE 20 (SOFTGOODS) ×3 IMPLANT
KIT BASIN OR (CUSTOM PROCEDURE TRAY) ×3 IMPLANT
NS IRRIG 1000ML POUR BTL (IV SOLUTION) ×3 IMPLANT
PACK TOTAL JOINT (CUSTOM PROCEDURE TRAY) ×3 IMPLANT
PADDING CAST COTTON 6X4 STRL (CAST SUPPLIES) ×4 IMPLANT
PEN SKIN MARKING BROAD (MISCELLANEOUS) ×3 IMPLANT
POSITIONER SURGICAL ARM (MISCELLANEOUS) ×3 IMPLANT
SET HNDPC FAN SPRY TIP SCT (DISPOSABLE) ×1 IMPLANT
SET PAD KNEE POSITIONER (MISCELLANEOUS) ×3 IMPLANT
STAPLER VISISTAT 35W (STAPLE) ×2 IMPLANT
STRIP CLOSURE SKIN 1/2X4 (GAUZE/BANDAGES/DRESSINGS) ×1 IMPLANT
SUCTION FRAZIER 12FR DISP (SUCTIONS) ×3 IMPLANT
SUT MNCRL AB 4-0 PS2 18 (SUTURE) IMPLANT
SUT VIC AB 0 CT1 27 (SUTURE) ×3
SUT VIC AB 0 CT1 27XBRD ANTBC (SUTURE) ×1 IMPLANT
SUT VIC AB 1 CT1 27 (SUTURE) ×6
SUT VIC AB 1 CT1 27XBRD ANTBC (SUTURE) ×2 IMPLANT
SUT VIC AB 2-0 CT1 27 (SUTURE) ×6
SUT VIC AB 2-0 CT1 TAPERPNT 27 (SUTURE) ×2 IMPLANT
TOWEL OR 17X26 10 PK STRL BLUE (TOWEL DISPOSABLE) ×3 IMPLANT
TOWEL OR NON WOVEN STRL DISP B (DISPOSABLE) ×2 IMPLANT
TRAY FOLEY W/METER SILVER 14FR (SET/KITS/TRAYS/PACK) ×1 IMPLANT
WATER STERILE IRR 1500ML POUR (IV SOLUTION) ×3 IMPLANT
WRAP KNEE MAXI GEL POST OP (GAUZE/BANDAGES/DRESSINGS) ×3 IMPLANT
YANKAUER SUCT BULB TIP 10FT TU (MISCELLANEOUS) ×3 IMPLANT

## 2015-01-08 NOTE — Brief Op Note (Signed)
01/08/2015  11:43 AM  PATIENT:  [name]  59 y.o. male  PRE-OPERATIVE DIAGNOSIS:  primary osteoarthritis left knee  POST-OPERATIVE DIAGNOSIS:  primary osteoarthritis left knee  PROCEDURE:  Procedure(s): LEFT TOTAL KNEE ARTHROPLASTY (Left)  SURGEON:  Surgeon(s) and Role:    * Kathryne Hitch, MD - Primary  PHYSICIAN ASSISTANT: Rexene Edison, PA-C  ANESTHESIA:   regional and general  EBL:  Total I/O In: 1000 [I.V.:1000] Out: -   BLOOD ADMINISTERED:none  DRAINS: none   LOCAL MEDICATIONS USED:  NONE  SPECIMEN:  No Specimen  DISPOSITION OF SPECIMEN:  N/A  COUNTS:  YES  TOURNIQUET:   Total Tourniquet Time Documented: Thigh (Left) - 60 minutes Total: Thigh (Left) - 60 minutes   DICTATION: .Other Dictation: Dictation Number [id number]  PLAN OF CARE: Admit to inpatient   PATIENT DISPOSITION:  PACU - hemodynamically stable.   Delay start of Pharmacological VTE agent (>24hrs) due to surgical blood loss or risk of bleeding: no

## 2015-01-08 NOTE — H&P (Signed)
  There has been no changes since his H&P from 2 weeks ago.  His left knee rash has resolved and I feel comfortable with proceeding with a left total knee replacement.  He is comfortable with this as well and wishes to proceed.  The risks and benefits of surgery have been discussed in detail.

## 2015-01-08 NOTE — Progress Notes (Signed)
Orthopedic Tech Progress Note Patient Details:  [name] 1955-11-02 [id number]  CPM Left Knee CPM Left Knee: On Left Knee Flexion (Degrees): 60 Left Knee Extension (Degrees): 0   Cammer, Mickie Bail 01/08/2015, 1:25 PM

## 2015-01-08 NOTE — Transfer of Care (Signed)
Immediate Anesthesia Transfer of Care Note  Patient: [name]  Procedure(s) Performed: Procedure(s): LEFT TOTAL KNEE ARTHROPLASTY (Left)  Patient Location: PACU  Anesthesia Type:GA combined with regional for post-op pain  Level of Consciousness:  sedated, patient cooperative and responds to stimulation  Airway & Oxygen Therapy:Patient Spontanous Breathing and Patient connected to face mask oxgen  Post-op Assessment:  Report given to PACU RN and Post -op Vital signs reviewed and stable  Post vital signs:  Reviewed and stable  Last Vitals:  Filed Vitals:   01/08/15 0943  BP:   Pulse: 77  Temp:   Resp: 13    Complications: No apparent anesthesia complications

## 2015-01-08 NOTE — Anesthesia Postprocedure Evaluation (Signed)
  Anesthesia Post-op Note  Patient: [name]  Procedure(s) Performed: Procedure(s) (LRB): LEFT TOTAL KNEE ARTHROPLASTY (Left)  Patient Location: PACU  Anesthesia Type: GA combined with regional for post-op pain  Level of Consciousness: awake and alert   Airway and Oxygen Therapy: Patient Spontanous Breathing  Post-op Pain: mild  Post-op Assessment: Post-op Vital signs reviewed, Patient's Cardiovascular Status Stable, Respiratory Function Stable, Patent Airway and No signs of Nausea or vomiting  Last Vitals:  Filed Vitals:   01/08/15 1449  BP: 162/70  Pulse: 87  Temp: 36.8 C  Resp: 14    Post-op Vital Signs: stable   Complications: No apparent anesthesia complications

## 2015-01-08 NOTE — Anesthesia Procedure Notes (Addendum)
Anesthesia Regional Block:  Adductor canal block  Pre-Anesthetic Checklist: ,, timeout performed, Correct Patient, Correct Site, Correct Laterality, Correct Procedure, Correct Position, site marked, Risks and benefits discussed,  Surgical consent,  Pre-op evaluation,  At surgeon's request and post-op pain management  Laterality: Lower and Left  Prep: chloraprep       Needles:  Injection technique: Single-shot  Needle Type: Stimiplex      Needle Gauge: 21 and 21 G    Additional Needles:  Procedures: ultrasound guided (picture in chart) Adductor canal block  Nerve Stimulator or Paresthesia:  Response: 0.6 mA,   Additional Responses:   Narrative:  Injection made incrementally with aspirations every 5 mL.  Performed by: Personally  Anesthesiologist: Sherrian Divers  Additional Notes: No pain on injection. No increased resistance to injection. Motor intact immediately after block. . Meaningful verbal contact maintained throughout block.    Procedure Name: Intubation Date/Time: 01/08/2015 10:13 AM Performed by: Epimenio Sarin Pre-anesthesia Checklist: Patient identified, Emergency Drugs available, Suction available, Patient being monitored and Timeout performed Patient Re-evaluated:Patient Re-evaluated prior to inductionOxygen Delivery Method: Circle system utilized Preoxygenation: Pre-oxygenation with 100% oxygen Intubation Type: IV induction and Rapid sequence Ventilation: Mask ventilation without difficulty Laryngoscope Size: 3 and Glidescope Grade View: Grade I Tube type: Parker flex tip Tube size: 7.5 mm Number of attempts: 1 Airway Equipment and Method: Video-laryngoscopy and Rigid stylet Placement Confirmation: ETT inserted through vocal cords under direct vision,  positive ETCO2 and breath sounds checked- equal and bilateral Secured at: 23 cm Tube secured with: Tape Dental Injury: Teeth and Oropharynx as per pre-operative assessment

## 2015-01-08 NOTE — Progress Notes (Signed)
AssistedDr. Denenny with left, adductor canal block. Side rails up, monitors on throughout procedure. See vital signs in flow sheet. Tolerated Procedure well.  

## 2015-01-08 NOTE — Anesthesia Preprocedure Evaluation (Addendum)
Anesthesia Evaluation  Patient identified by MRN, date of birth, ID band Patient awake    Reviewed: Allergy & Precautions, NPO status , Patient's Chart, lab work & pertinent test results  Airway Mallampati: II  TM Distance: >3 FB Neck ROM: Full    Dental no notable dental hx.    Pulmonary neg pulmonary ROS,  breath sounds clear to auscultation  Pulmonary exam normal       Cardiovascular Exercise Tolerance: Good Normal cardiovascular examRhythm:Regular Rate:Normal     Neuro/Psych  Headaches, PSYCHIATRIC DISORDERS Anxiety Depression    GI/Hepatic Neg liver ROS, GERD-  Medicated,  Endo/Other  negative endocrine ROS  Renal/GU negative Renal ROS  negative genitourinary   Musculoskeletal  (+) Arthritis -, Rheumatoid disorders,    Abdominal   Peds negative pediatric ROS (+)  Hematology negative hematology ROS (+)   Anesthesia Other Findings   Reproductive/Obstetrics negative OB ROS                            Anesthesia Physical Anesthesia Plan  ASA: II  Anesthesia Plan: General   Post-op Pain Management:    Induction: Intravenous  Airway Management Planned: Oral ETT  Additional Equipment:   Intra-op Plan:   Post-operative Plan: Extubation in OR  Informed Consent: I have reviewed the patients History and Physical, chart, labs and discussed the procedure including the risks, benefits and alternatives for the proposed anesthesia with the patient or authorized representative who has indicated his/her understanding and acceptance.   Dental advisory given  Plan Discussed with: CRNA  Anesthesia Plan Comments: (H/O Extensive lumbar fusion L2-5. Plan general with adductor canal block.   Discussed risks of adductor canal block including failure, bleeding, infection, nerve damage.  Adductor canal block does not usually prevent all pain. Specifically, it treats the anterior, but often not the  posterior knee. Questions answered.  Patient consents to block.  Neck precautions with Rheumatoid arthritis history.)       Anesthesia Quick Evaluation

## 2015-01-09 LAB — BASIC METABOLIC PANEL
Anion gap: 9 (ref 5–15)
BUN: 17 mg/dL (ref 6–20)
CALCIUM: 8.3 mg/dL — AB (ref 8.9–10.3)
CHLORIDE: 99 mmol/L — AB (ref 101–111)
CO2: 28 mmol/L (ref 22–32)
CREATININE: 0.67 mg/dL (ref 0.61–1.24)
GFR calc Af Amer: >60 mL/min (ref >60)
Glucose, Bld: 123 mg/dL — ABNORMAL HIGH (ref 65–99)
Potassium: 3.9 mmol/L (ref 3.5–5.1)
SODIUM: 136 mmol/L (ref 135–145)

## 2015-01-09 LAB — CBC
HEMATOCRIT: 34.7 % — AB (ref 39.0–52.0)
Hemoglobin: 11.8 g/dL — ABNORMAL LOW (ref 13.0–17.0)
MCH: 31.8 pg (ref 26.0–34.0)
MCHC: 34 g/dL (ref 30.0–36.0)
MCV: 93.5 fL (ref 78.0–100.0)
Platelets: 163 10*3/uL (ref 150–400)
RBC: 3.71 MIL/uL — ABNORMAL LOW (ref 4.22–5.81)
RDW: 12.9 % (ref 11.5–15.5)
WBC: 11.9 10*3/uL — ABNORMAL HIGH (ref 4.0–10.5)

## 2015-01-09 NOTE — Evaluation (Signed)
Occupational Therapy Evaluation Patient Details Name: [name] MRN: [id number] DOB: 1956/03/05 Today's Date: 01/09/2015    History of Present Illness L TKR; s/p L THR(13) and lumbar fusion(14)   Clinical Impression   Began education on AE options and use. Also practiced functional transfer to 3in1 with walker. Pt will benefit from continued OT to progress ADL independence and safety.    Follow Up Recommendations  No OT follow up;Supervision/Assistance - 24 hour    Equipment Recommendations  3 in 1 bedside comode (only if pt not able to obtain 3in1 on his own)    Recommendations for Other Services       Precautions / Restrictions Precautions Precautions: Knee;Fall Required Braces or Orthoses: Knee Immobilizer - Left Knee Immobilizer - Left: Discontinue once straight leg raise with < 10 degree lag Restrictions Weight Bearing Restrictions: No Other Position/Activity Restrictions: WBAT      Mobility Bed Mobility Overal bed mobility: Needs Assistance Bed Mobility: Sit to Supine       Sit to supine: Min assist   General bed mobility comments: pt used L LE to self assist. min assist for support.   Transfers Overall transfer level: Needs assistance Equipment used: Rolling walker (2 wheeled) Transfers: Sit to/from Stand Sit to Stand: Min guard         General transfer comment: from 3in1 with verbal cues for hand placement. min assist to descend to commode.    Balance                                            ADL Overall ADL's : Needs assistance/impaired Eating/Feeding: Independent;Sitting   Grooming: Wash/dry hands;Set up;Sitting   Upper Body Bathing: Set up;Sitting   Lower Body Bathing: Moderate assistance;Sit to/from stand   Upper Body Dressing : Set up;Sitting   Lower Body Dressing: Maximal assistance;Sit to/from stand   Toilet Transfer: Minimal assistance;Ambulation;BSC;RW   Toileting- Clothing Manipulation and Hygiene:  Minimal assistance;Sit to/from stand         General ADL Comments: Pt has shoe horn and reacher from previous surgeries so educated pt on LHS as well as sock aid options. Pt states he likely wont need the sock aid as he will probably wear slippers and not socks. educated on making sure slipper has good grip and a back on them. Educated on where to obtain [facility] if desired. Attempted to demonstrate Ae but pt seemed distracted by pain so will need more reinforcement with reacher/shoe horn. Discussed 3in1 and pt would like to see if he can obtain one on his own first and if not, will liikley need one ordered. Also started discussion on tub DME options.      Vision     Perception     Praxis      Pertinent Vitals/Pain Pain Assessment: 0-10 Pain Score: 9  Faces Pain Scale: Hurts even more Pain Location: R knee Pain Descriptors / Indicators: Aching Pain Intervention(s): Repositioned;Ice applied;Other (comment) (informed nursing)     Hand Dominance Right   Extremity/Trunk Assessment Upper Extremity Assessment Upper Extremity Assessment: Overall WFL for tasks assessed           Communication Communication Communication: No difficulties   Cognition Arousal/Alertness: Awake/alert Behavior During Therapy: WFL for tasks assessed/performed Overall Cognitive Status: Within Functional Limits for tasks assessed  General Comments       Exercises       Shoulder Instructions      Home Living Family/patient expects to be discharged to:: Private residence Living Arrangements: Alone Available Help at Discharge: Available PRN/intermittently Type of Home: House Home Access: Stairs to enter [employer] of Steps: 3 Entrance Stairs-Rails: None Home Layout: One level     Bathroom Shower/Tub: Chief Strategy Officer: Standard     Home Equipment: Crutches   Additional Comments: wants to see if he can borrow a 3in1 before ordering one       Prior Functioning/Environment Level of Independence: Independent             OT Diagnosis: Generalized weakness;Acute pain   OT Problem List: Decreased strength;Decreased knowledge of use of DME or AE;Pain   OT Treatment/Interventions: Self-care/ADL training;Patient/family education;Therapeutic activities;DME and/or AE instruction    OT Goals(Current goals can be found in the care plan section) Acute Rehab OT Goals Patient Stated Goal: Home OT Goal Formulation: With patient Time For Goal Achievement: 01/16/15 Potential to Achieve Goals: Good  OT Frequency: Min 2X/week   Barriers to D/C:            Co-evaluation              End of Session Equipment Utilized During Treatment: Gait belt;Rolling walker;Right knee immobilizer CPM Left Knee CPM Left Knee: On  Activity Tolerance: Patient limited by pain Patient left: in bed;with call bell/phone within reach (with ortho tech present for CPM)   Time: 1355-1418 OT Time Calculation (min): 23 min Charges:  OT General Charges $OT Visit: 1 Procedure OT Evaluation $Initial OT Evaluation Tier I: 1 Procedure OT Treatments $Therapeutic Activity: 8-22 mins G-Codes:    Lennox Laity  [phone] 01/09/2015, 2:28 PM

## 2015-01-09 NOTE — Progress Notes (Signed)
Physical Therapy Treatment Patient Details Name: [name] MRN: [id number] DOB: 04-22-1956 Today's Date: 01/09/2015    History of Present Illness L TKR; s/p L THR(13) and lumbar fusion(14)    PT Comments    Steady progress with mobility - pain limited  Follow Up Recommendations  Home health PT     Equipment Recommendations  Rolling walker with 5" wheels    Recommendations for Other Services OT consult     Precautions / Restrictions Precautions Precautions: Knee;Fall Required Braces or Orthoses: Knee Immobilizer - Left Knee Immobilizer - Left: Discontinue once straight leg raise with < 10 degree lag Restrictions Weight Bearing Restrictions: No Other Position/Activity Restrictions: WBAT    Mobility  Bed Mobility                  Transfers Overall transfer level: Needs assistance Equipment used: Rolling walker (2 wheeled) Transfers: Sit to/from Stand Sit to Stand: Min guard         General transfer comment: cues for LE management and use of UEs to self assist  Ambulation/Gait Ambulation/Gait assistance: Min assist;Min guard Ambulation Distance (Feet): 66 Feet Assistive device: Rolling walker (2 wheeled) Gait Pattern/deviations: Step-to pattern;Shuffle;Trunk flexed Gait velocity: decr   General Gait Details: cues for posture, sequence and position from [employer] Mobility    Modified Rankin (Stroke Patients Only)       Balance                                    Cognition Arousal/Alertness: Awake/alert Behavior During Therapy: WFL for tasks assessed/performed Overall Cognitive Status: Within Functional Limits for tasks assessed                      Exercises      General Comments        Pertinent Vitals/Pain Pain Assessment: Faces Faces Pain Scale: Hurts even more Pain Location: R knee Pain Descriptors / Indicators: Aching;Sore;Burning Pain Intervention(s): Limited activity  within patient's tolerance;Monitored during session;Premedicated before session    Home Living                      Prior Function            PT Goals (current goals can now be found in the care plan section) Acute Rehab PT Goals Patient Stated Goal: Home PT Goal Formulation: With patient Time For Goal Achievement: 01/16/15 Potential to Achieve Goals: Good Progress towards PT goals: Progressing toward goals    Frequency  7X/week    PT Plan Current plan remains appropriate    Co-evaluation             End of Session Equipment Utilized During Treatment: Gait belt;Left knee immobilizer Activity Tolerance: Patient tolerated treatment well Patient left: Other (comment) (entry to bathroom with OT)     Time: 4235-3614 PT Time Calculation (min) (ACUTE ONLY): 15 min  Charges:  $Gait Training: 8-22 mins                    G Codes:  Functional Assessment Tool Used:     [name] 01/09/2015, 2:20 PM

## 2015-01-09 NOTE — Op Note (Signed)
NAMEHENDRIXX, SEVERIN NO.:  [id number]  MEDICAL RECORD NO.:  [id number]  LOCATION:  1531                         FACILITY:  [facility]  PHYSICIAN:  Vanita Panda. Magnus Ivan, M.D.DATE OF BIRTH:  01-21-1956  DATE OF PROCEDURE:  01/08/2015 DATE OF DISCHARGE:                              OPERATIVE REPORT   PREOPERATIVE DIAGNOSIS:  Primary osteoarthritis combined with rheumatoid arthritis, left knee.  POSTOPERATIVE DIAGNOSIS:  Primary osteoarthritis combined with rheumatoid arthritis, left knee.  PROCEDURE:  Left total knee arthroplasty.  IMPLANTS:  Stryker Triathlon knee with size 4 femur, size 5 tibial tray, 9 mm polyethylene insert, size 35 patellar button.  SURGEON:  Vanita Panda. Magnus Ivan, M.D.  ASSISTANT:  Richardean Canal, PA-C.  ANESTHESIA: 1. Left leg adductor canal block. 2. General.  TOURNIQUET TIME:  Less than 1 hour.  ANTIBIOTICS:  900 mg of IV clindamycin.  BLOOD LOSS:  Less than 200 mL.  COMPLICATIONS:  None.  INDICATIONS:  Wah is a 59 year old gentleman with rheumatoid arthritis and osteoarthritis who we performed a total hip arthroplasty in the past.  His left knee though has become debilitatingly painful to him.  He has had arthroscopy in the past as well as an MRI that showed significant loss of cartilage with varus deformity of the medial aspect of his knee.  Given his rheumatoid disease, he is not a candidate for medial compartment or unicompartmental knee replacement, and has elected to receive a total knee arthroplasty.  He does this after the failure of conservative treatment.  He understands the risks of acute blood loss anemia, nerve and vessel injury, fracture, infection, and DVT.  He understands our goals are improved mobility, decreased pain, and overall improved quality of life.  DESCRIPTION OF PROCEDURE:  After informed consent was obtained, appropriate left knee was marked.  Anesthesia was obtained with adductor canal  block on his left leg.  He was then brought back to the operating room and placed supine on the operating table where general anesthesia was then obtained and a nonsterile tourniquet was placed around his upper left thigh.  His left leg was prepped and draped with DuraPrep and sterile drapes including a sterile stockinette.  Time-out was called and he was identified as correct patient, correct left knee.  We then used an Esmarch to wrap out the leg and the tourniquet was inflated to 300 mm of pressure.  I then made a direct midline incision over the patella and carried this proximally and distally.  We dissected down to the knee joint and carried out a medial parapatellar arthrotomy.  We found a large joint effusion of his knee and significant synovium as well as significant cartilage loss on the medial compartment and varus deformity.  We removed remnants of the medial and lateral meniscus, ACL, PCL, and periarticular osteophytes.  With the knee in a flexed position using the extramedullary cutting guide for the tibia, we set our tibia cut to take 9 mm off the high side.  We tried to correct for varus, valgus, and a neutral slope, and we made this cut without difficulty. We then went to the femur using an intramedullary guide for the femur through the notch.  We put  our distal femoral cutting guide to take 10 mm off the distal femur setting for 5-degree external rotator for the left knee.  We made this cut without difficulty and brought it back down to extension with a 9 mm extension block, he had full extension.  We then went back to the femur and put our femoral sizing guide based off the epicondylar axis and Whitesides line.  We chose a size 4 femur, we put our 4:1 cutting block for femur.  We made our anterior and posterior cuts followed by our chamfer cuts.  We then used our 9-mm extension block in flexion and felt that he was equal with his flexion gap.  We then made our femoral box  cut and went back to the tibia setting our tibial rotation off the tibial tubercle on the femoral component.  We chose a size 5 tibial tray for coverage of the tibia.  We did our keel punch off this and with the trial size 5 tibia and the trial size 4 femur for the left knee, we placed 9 mm fixed bearing trial polyethylene insert and brought the leg back down into extension.  He was full.  I was pleased with his flexion, range of motion, and stability.  We then made our patellar cut for a size 35 patellar button and 3 pegs for this. We then removed all trial components and copiously irrigated the knee with normal saline solution using pulsatile lavage.  We then mixed our cement and cemented our real Stryker Triathlon tibial tray size 5 followed by a real size 4 femur.  We cleaned the cement debris from the knee and placed our real fix bearing 9 mm polyethylene insert.  We also then cemented our patellar button.  Once the cement had hardened, we removed the cement debris from the knee.  The tourniquet was let down. Hemostasis was obtained with electrocautery.  We then irrigated the knee again with normal saline solution using pulsatile lavage and closed the arthrotomy with interrupted #1 Vicryl suture followed by 0-Vicryl in the deep tissue, 2-0 Vicryl in the subcutaneous tissue, and staples on the skin given previous rash over his knee.  We placed Xeroform well-padded sterile dressing.  He was then awakened, extubated, and taken to the recovery in stable condition.  All final counts were correct.  There were no complications noted.  Of note, Richardean Canal, PA-C assisted during the entire case and his assistance was crucial for facilitating all aspects of this case.     Vanita Panda. Magnus Ivan, M.D.     CYB/MEDQ  D:  01/08/2015  T:  01/09/2015  Job:  [id number]

## 2015-01-09 NOTE — Progress Notes (Signed)
Patient ID: [name], male   DOB: [birth year], 59 y.o.   MRN: [id number] Postoperative day 1 total knee arthroplasty on the left. Patient is comfortable this morning hemoglobin is stable plan for discharge to home tomorrow

## 2015-01-09 NOTE — Evaluation (Signed)
Physical Therapy Evaluation Patient Details Name: [name] MRN: [id number] DOB: 25-Mar-1956 Today's Date: 01/09/2015   History of Present Illness  L TKR; s/p L THR(13) and lumbar fusion(14)  Clinical Impression  Pt s/p L TKR presents with decreased L LE strength/ROM and post op pain limiting functional mobility.  Pt should progress well to dc home alone with HHPT follow up and ltd assist of family.    Follow Up Recommendations Home health PT    Equipment Recommendations  Rolling walker with 5" wheels    Recommendations for Other Services OT consult     Precautions / Restrictions Precautions Precautions: Knee;Fall Required Braces or Orthoses: Knee Immobilizer - Left Knee Immobilizer - Left: Discontinue once straight leg raise with < 10 degree lag Restrictions Weight Bearing Restrictions: No Other Position/Activity Restrictions: WBAT      Mobility  Bed Mobility Overal bed mobility: Needs Assistance Bed Mobility: Supine to Sit;Sit to Supine     Supine to sit: Min assist Sit to supine: Min assist   General bed mobility comments: cues for sequence and use of L LE to self assist  Transfers Overall transfer level: Needs assistance Equipment used: Rolling walker (2 wheeled) Transfers: Sit to/from Stand Sit to Stand: Min assist         General transfer comment: cues for LE management and use of UEs to self assist  Ambulation/Gait Ambulation/Gait assistance: Min assist Ambulation Distance (Feet): 40 Feet Assistive device: Rolling walker (2 wheeled) Gait Pattern/deviations: Step-to pattern;Decreased step length - right;Decreased step length - left;Shuffle;Trunk flexed Gait velocity: decr   General Gait Details: cues for posture, sequence and position from [name] Mobility    Modified Rankin (Stroke Patients Only)       Balance                                             Pertinent Vitals/Pain Pain  Assessment: Faces Faces Pain Scale: Hurts even more Pain Location: R knee Pain Descriptors / Indicators: Aching;Sore Pain Intervention(s): Limited activity within patient's tolerance;Monitored during session;Premedicated before session;Ice applied    Home Living Family/patient expects to be discharged to:: Private residence Living Arrangements: Alone Available Help at Discharge: Available PRN/intermittently Type of Home: House Home Access: Stairs to enter Entrance Stairs-Rails: None Entrance Stairs-Number of Steps: 3 Home Layout: One level Home Equipment: Crutches      Prior Function Level of Independence: Independent               Hand Dominance   Dominant Hand: Right    Extremity/Trunk Assessment   Upper Extremity Assessment: Overall WFL for tasks assessed           Lower Extremity Assessment: RLE deficits/detail RLE Deficits / Details: 2/5 quads with AAROM at knee -10- 35    Cervical / Trunk Assessment: Normal  Communication   Communication: No difficulties  Cognition Arousal/Alertness: Awake/alert Behavior During Therapy: WFL for tasks assessed/performed Overall Cognitive Status: Within Functional Limits for tasks assessed                      General Comments      Exercises Total Joint Exercises Ankle Circles/Pumps: AROM;Both;15 reps;Supine Quad Sets: AROM;Both;10 reps;Supine Heel Slides: AAROM;Right;10 reps;Supine Straight Leg Raises: AAROM;Right;10 reps;Supine      Assessment/Plan  PT Assessment Patient needs continued PT services  PT Diagnosis Difficulty walking   PT Problem List Decreased strength;Decreased range of motion;Decreased activity tolerance;Decreased mobility;Pain;Decreased knowledge of use of DME  PT Treatment Interventions DME instruction;Gait training;Stair training;Functional mobility training;Therapeutic activities;Therapeutic exercise;Patient/family education   PT Goals (Current goals can be found in the Care  Plan section) Acute Rehab PT Goals Patient Stated Goal: Home PT Goal Formulation: With patient Time For Goal Achievement: 01/16/15 Potential to Achieve Goals: Good    Frequency 7X/week   Barriers to discharge Decreased caregiver support Home alone    Co-evaluation               End of Session Equipment Utilized During Treatment: Gait belt Activity Tolerance: Patient tolerated treatment well Patient left: in chair;with call bell/phone within reach Nurse Communication: Mobility status         Time: 1007-1040 PT Time Calculation (min) (ACUTE ONLY): 33 min   Charges:   PT Evaluation $Initial PT Evaluation Tier I: 1 Procedure PT Treatments $Therapeutic Exercise: 8-22 mins   PT G Codes:        Arianis Bowditch 02-Feb-2015, 12:34 PM

## 2015-01-10 LAB — CBC
HEMATOCRIT: 34.8 % — AB (ref 39.0–52.0)
HEMOGLOBIN: 12 g/dL — AB (ref 13.0–17.0)
MCH: 32.3 pg (ref 26.0–34.0)
MCHC: 34.5 g/dL (ref 30.0–36.0)
MCV: 93.5 fL (ref 78.0–100.0)
Platelets: 158 10*3/uL (ref 150–400)
RBC: 3.72 MIL/uL — AB (ref 4.22–5.81)
RDW: 13 % (ref 11.5–15.5)
WBC: 9 10*3/uL (ref 4.0–10.5)

## 2015-01-10 MED ORDER — POLYETHYLENE GLYCOL 3350 17 G PO PACK
17.0000 g | PACK | Freq: Every day | ORAL | Status: DC
  Administered 2015-01-10 – 2015-01-11 (×2): 17 g via ORAL
  Filled 2015-01-10 (×2): qty 1

## 2015-01-10 NOTE — Progress Notes (Signed)
Physical Therapy Treatment Patient Details Name: [name] MRN: [id number] DOB: 23-Nov-1955 Today's Date: 01/10/2015    History of Present Illness L TKR; s/p L THR(13) and lumbar fusion(14)    PT Comments    Pt progressing with mobility. He walked 76' with RW and supervision today. Performed L TKA exercises with assist. L knee flexion AAROM 35*. Encouraged pt to get OOB to walk to restroom rather than using urinal. Will do stairs next session.   Follow Up Recommendations  Home health PT     Equipment Recommendations  Rolling walker with 5" wheels    Recommendations for Other Services OT consult     Precautions / Restrictions Precautions Precautions: Knee;Fall Required Braces or Orthoses: Knee Immobilizer - Left Knee Immobilizer - Left: Discontinue once straight leg raise with < 10 degree lag Restrictions Weight Bearing Restrictions: No Other Position/Activity Restrictions: WBAT    Mobility  Bed Mobility Overal bed mobility: Modified Independent Bed Mobility: Sit to Supine;Supine to Sit     Supine to sit: Modified independent (Device/Increase time) Sit to supine: Modified independent (Device/Increase time)   General bed mobility comments: self assisted using RLE  Transfers Overall transfer level: Needs assistance Equipment used: Rolling walker (2 wheeled) Transfers: Sit to/from Stand Sit to Stand: Modified independent (Device/Increase time)         General transfer comment: sit to stand from bed in low position  Ambulation/Gait Ambulation/Gait assistance: Supervision Ambulation Distance (Feet): 90 Feet Assistive device: Rolling walker (2 wheeled) Gait Pattern/deviations: Step-to pattern;Trunk flexed   Gait velocity interpretation: Below normal speed for age/gender General Gait Details: cues for heel strike and for knee flexion during L swing phase, steady with RW   Stairs            Wheelchair Mobility    Modified Rankin (Stroke Patients  Only)       Balance                                    Cognition Arousal/Alertness: Awake/alert Behavior During Therapy: WFL for tasks assessed/performed Overall Cognitive Status: Within Functional Limits for tasks assessed                      Exercises Total Joint Exercises Ankle Circles/Pumps: AROM;Both;15 reps;Supine Quad Sets: AROM;Both;10 reps;Supine Short Arc Quad: AAROM;Left;Supine Heel Slides: AAROM;10 reps;Supine;Left Hip ABduction/ADduction: AAROM;Left;10 reps;Supine Straight Leg Raises: AAROM;10 reps;Supine;Left    General Comments        Pertinent Vitals/Pain Pain Score: 7  Pain Location: L knee Pain Descriptors / Indicators: Sore Pain Intervention(s): Limited activity within patient's tolerance;Ice applied;Monitored during session;Premedicated before session    Home Living                      Prior Function            PT Goals (current goals can now be found in the care plan section) Acute Rehab PT Goals Patient Stated Goal: Home. play with granddaughter PT Goal Formulation: With patient Time For Goal Achievement: 01/16/15 Potential to Achieve Goals: Good Progress towards PT goals: Progressing toward goals    Frequency  7X/week    PT Plan Current plan remains appropriate    Co-evaluation             End of Session Equipment Utilized During Treatment: Gait belt;Left knee immobilizer Activity Tolerance: Patient tolerated treatment well Patient left: in  bed;with call bell/phone within reach (entry to bathroom with OT)     Time: 9379-0240 PT Time Calculation (min) (ACUTE ONLY): 30 min  Charges:  $Gait Training: 8-22 mins $Therapeutic Exercise: 8-22 mins                    G Codes:      Tamala Ser 01/10/2015, 9:04 AM [phone]

## 2015-01-10 NOTE — Progress Notes (Signed)
Ted and scd off

## 2015-01-10 NOTE — Progress Notes (Signed)
Physical Therapy Treatment Patient Details Name: [name] MRN: [id number] DOB: 1956-07-25 Today's Date: 01/10/2015    History of Present Illness L TKR; s/p L THR(13) and lumbar fusion(14)    PT Comments    Assisted pt with L TKA exercises, L knee ROM is progressing slowly with flexion at 35*. Stair training initiated, will need to review with family tomorrow. With walking pt started feeling overheated and unwell, vital signs were stable and he felt fine once he sat down.   Follow Up Recommendations  Home health PT     Equipment Recommendations  Rolling walker with 5" wheels    Recommendations for Other Services OT consult     Precautions / Restrictions Precautions Precautions: Knee;Fall Required Braces or Orthoses: Knee Immobilizer - Left Knee Immobilizer - Left: Discontinue once straight leg raise with < 10 degree lag Restrictions Weight Bearing Restrictions: No Other Position/Activity Restrictions: WBAT    Mobility  Bed Mobility Overal bed mobility: Modified Independent Bed Mobility: Sit to Supine;Supine to Sit     Supine to sit: Modified independent (Device/Increase time) Sit to supine: Modified independent (Device/Increase time)   General bed mobility comments: pt up walking in hall at start of PT session  Transfers Overall transfer level: Needs assistance Equipment used: Rolling walker (2 wheeled) Transfers: Sit to/from Stand Sit to Stand: Modified independent (Device/Increase time)         General transfer comment: stand to sit with verbal cues for hand placement  Ambulation/Gait Ambulation/Gait assistance: Supervision Ambulation Distance (Feet): 45 Feet Assistive device: Rolling walker (2 wheeled) Gait Pattern/deviations: Step-to pattern;Decreased step length - left;Decreased step length - right Gait velocity: decr Gait velocity interpretation: Below normal speed for age/gender General Gait Details: cues for heel strike and for knee flexion  during L swing phase, steady with RW, distance limited due to pt feeling overheated, assisted pt to recliner, vital signs were BP 160/78, HR 98, SaO2 99% on RA, pt felt better after sitting   Stairs Stairs: Yes Stairs assistance: Min assist Stair Management: No rails;Backwards;With walker Number of Stairs: 3 General stair comments: cues for sequencing, min A to steady RW  Wheelchair Mobility    Modified Rankin (Stroke Patients Only)       Balance                                    Cognition Arousal/Alertness: Awake/alert Behavior During Therapy: WFL for tasks assessed/performed Overall Cognitive Status: Within Functional Limits for tasks assessed                      Exercises Total Joint Exercises Ankle Circles/Pumps: AROM;Both;15 reps;Supine Quad Sets: AROM;Both;10 reps;Supine Short Arc Quad: AAROM;Left;Supine Heel Slides: AAROM;10 reps;Supine;Left Hip ABduction/ADduction: AAROM;Left;10 reps;Supine Straight Leg Raises: AAROM;10 reps;Supine;Left Long Arc Quad: AAROM;Left;10 reps;Seated Knee Flexion: AAROM;Left;10 reps;Seated Goniometric ROM: 0-35* L knee AAROM    General Comments        Pertinent Vitals/Pain Pain Score: 7  Pain Location: L knee Pain Descriptors / Indicators: Sore Pain Intervention(s): Limited activity within patient's tolerance;Premedicated before session;Monitored during session;Ice applied    Home Living                      Prior Function            PT Goals (current goals can now be found in the care plan section) Acute Rehab PT Goals Patient Stated  Goal: Home. play with granddaughter PT Goal Formulation: With patient Time For Goal Achievement: 01/16/15 Potential to Achieve Goals: Good Progress towards PT goals: Progressing toward goals    Frequency  7X/week    PT Plan Current plan remains appropriate    Co-evaluation             End of Session Equipment Utilized During Treatment: Gait  belt Activity Tolerance: Patient tolerated treatment well Patient left: with call bell/phone within reach;in chair (entry to bathroom with OT)     Time: 1638-4536 PT Time Calculation (min) (ACUTE ONLY): 24 min  Charges:  $Gait Training: 8-22 mins $Therapeutic Exercise: 8-22 mins                    G Codes:      Tamala Ser 01/10/2015, 12:38 PM [phone]

## 2015-01-10 NOTE — Progress Notes (Signed)
Patient ID: [name], male   DOB: Jun 03, 1956, 59 y.o.   MRN: [id number] Postoperative day 2 left total knee arthroplasty. Patient is progressing well with therapy plan for discharge to home on Monday.

## 2015-01-10 NOTE — Care Management Note (Signed)
Case Management Note  Patient Details  Name: Errick Salts MRN: [id number] Date of Birth: Sep 27, 1955  Subjective/Objective:       Left total knee arthroplasty             Action/Plan:   Expected Discharge Date:   01/11/2015               Expected Discharge Plan:  Home w Home Health Services  In-House Referral:     Discharge planning Services  CM Consult  Post Acute Care Choice:    Choice offered to:     DME Arranged:  3-N-1, Walker rolling DME Agency:  [facility]  HH Arranged:  PT [facility] Agency:  [facility]  Status of Service:  Completed, signed off  Medicare Important Message Given:  No Date Medicare IM Given:    Medicare IM give by:    Date Additional Medicare IM Given:    Additional Medicare Important Message give by:     If discussed at Long Length of Stay Meetings, dates discussed:    Additional Comments: NCM spoke to pt and offered choice for [facility]. Pt agreeable to [facility] for [facility] Requesting 3n1 and RW for home. Contacted [facility] for DME for home.  Elliot Cousin, RN 01/10/2015, 2:32 PM

## 2015-01-11 LAB — CBC
HCT: 33.4 % — ABNORMAL LOW (ref 39.0–52.0)
HEMOGLOBIN: 11.3 g/dL — AB (ref 13.0–17.0)
MCH: 31.5 pg (ref 26.0–34.0)
MCHC: 33.8 g/dL (ref 30.0–36.0)
MCV: 93 fL (ref 78.0–100.0)
Platelets: 152 10*3/uL (ref 150–400)
RBC: 3.59 MIL/uL — ABNORMAL LOW (ref 4.22–5.81)
RDW: 12.7 % (ref 11.5–15.5)
WBC: 8.9 10*3/uL (ref 4.0–10.5)

## 2015-01-11 MED ORDER — RIVAROXABAN 10 MG PO TABS: 10.0000 mg | ORAL_TABLET | Freq: Every day | ORAL | Status: DC

## 2015-01-11 MED ORDER — OXYCODONE-ACETAMINOPHEN 5-325 MG PO TABS: 1.0000 | ORAL_TABLET | ORAL | Status: DC | PRN

## 2015-01-11 MED ORDER — CYCLOBENZAPRINE HCL 10 MG PO TABS: 10.0000 mg | ORAL_TABLET | Freq: Three times a day (TID) | ORAL | Status: DC | PRN

## 2015-01-11 NOTE — Progress Notes (Signed)
Discharge instructions and prescriptions given to patient .  Questions answered 

## 2015-01-11 NOTE — Discharge Instructions (Signed)
Information on my medicine - XARELTO® (Rivaroxaban) ° °This medication education was reviewed with me or my healthcare representative as part of my discharge preparation.  The pharmacist that spoke with me during my hospital stay was:  Pickering, Thomas Robert, RPH ° °Why was Xarelto® prescribed for you? °Xarelto® was prescribed for you to reduce the risk of blood clots forming after orthopedic surgery. The medical term for these abnormal blood clots is venous thromboembolism (VTE). ° °What do you need to know about xarelto® ? °Take your Xarelto® ONCE DAILY at the same time every day. °You may take it either with or without food. ° °If you have difficulty swallowing the tablet whole, you may crush it and mix in applesauce just prior to taking your dose. ° °Take Xarelto® exactly as prescribed by your doctor and DO NOT stop taking Xarelto® without talking to the doctor who prescribed the medication.  Stopping without other VTE prevention medication to take the place of Xarelto® may increase your risk of developing a clot. ° °After discharge, you should have regular check-up appointments with your healthcare provider that is prescribing your Xarelto®.   ° °What do you do if you miss a dose? °If you miss a dose, take it as soon as you remember on the same day then continue your regularly scheduled once daily regimen the next day. Do not take two doses of Xarelto® on the same day.  ° °Important Safety Information °A possible side effect of Xarelto® is bleeding. You should call your healthcare provider right away if you experience any of the following: °? Bleeding from an injury or your nose that does not stop. °? Unusual colored urine (red or dark brown) or unusual colored stools (red or black). °? Unusual bruising for unknown reasons. °? A serious fall or if you hit your head (even if there is no bleeding). ° °Some medicines may interact with Xarelto® and might increase your risk of bleeding while on Xarelto®. To help  avoid this, consult your healthcare provider or pharmacist prior to using any new prescription or non-prescription medications, including herbals, vitamins, non-steroidal anti-inflammatory drugs (NSAIDs) and supplements. ° °This website has more information on Xarelto®: www.xarelto.com. ° ° °INSTRUCTIONS AFTER JOINT REPLACEMENT  ° °o Remove items at home which could result in a fall. This includes throw rugs or furniture in walking pathways °o ICE to the affected joint every three hours while awake for 30 minutes at a time, for at least the first 3-5 days, and then as needed for pain and swelling.  Continue to use ice for pain and swelling. You may notice swelling that will progress down to the foot and ankle.  This is normal after surgery.  Elevate your leg when you are not up walking on it.   °o Continue to use the breathing machine you got in the hospital (incentive spirometer) which will help keep your temperature down.  It is common for your temperature to cycle up and down following surgery, especially at night when you are not up moving around and exerting yourself.  The breathing machine keeps your lungs expanded and your temperature down. ° ° °DIET:  As you were doing prior to hospitalization, we recommend a well-balanced diet. ° °DRESSING / WOUND CARE / SHOWERING ° °Keep the surgical dressing until follow up.  The dressing is water proof, so you can shower without any extra covering.  IF THE DRESSING FALLS OFF or the wound gets wet inside, change the dressing with sterile gauze.    Please use good hand washing techniques before changing the dressing.  Do not use any lotions or creams on the incision until instructed by your surgeon.   ° °ACTIVITY ° °o Increase activity slowly as tolerated, but follow the weight bearing instructions below.   °o No driving for 6 weeks or until further direction given by your physician.  You cannot drive while taking narcotics.  °o No lifting or carrying greater than 10 lbs.  until further directed by your surgeon. °o Avoid periods of inactivity such as sitting longer than an hour when not asleep. This helps prevent blood clots.  °o You may return to work once you are authorized by your doctor.  ° ° ° °WEIGHT BEARING  ° °Weight bearing as tolerated with assist device (walker, cane, etc) as directed, use it as long as suggested by your surgeon or therapist, typically at least 4-6 weeks. ° ° °EXERCISES ° °Results after joint replacement surgery are often greatly improved when you follow the exercise, range of motion and muscle strengthening exercises prescribed by your doctor. Safety measures are also important to protect the joint from further injury. Any time any of these exercises cause you to have increased pain or swelling, decrease what you are doing until you are comfortable again and then slowly increase them. If you have problems or questions, call your caregiver or physical therapist for advice.  ° °Rehabilitation is important following a joint replacement. After just a few days of immobilization, the muscles of the leg can become weakened and shrink (atrophy).  These exercises are designed to build up the tone and strength of the thigh and leg muscles and to improve motion. Often times heat used for twenty to thirty minutes before working out will loosen up your tissues and help with improving the range of motion but do not use heat for the first two weeks following surgery (sometimes heat can increase post-operative swelling).  ° °These exercises can be done on a training (exercise) mat, on the floor, on a table or on a bed. Use whatever works the best and is most comfortable for you.    Use music or television while you are exercising so that the exercises are a pleasant break in your day. This will make your life better with the exercises acting as a break in your routine that you can look forward to.   Perform all exercises about fifteen times, three times per day or as  directed.  You should exercise both the operative leg and the other leg as well. ° °Exercises include: °  °• Quad Sets - Tighten up the muscle on the front of the thigh (Quad) and hold for 5-10 seconds.   °• Straight Leg Raises - With your knee straight (if you were given a brace, keep it on), lift the leg to 60 degrees, hold for 3 seconds, and slowly lower the leg.  Perform this exercise against resistance later as your leg gets stronger.  °• Leg Slides: Lying on your back, slowly slide your foot toward your buttocks, bending your knee up off the floor (only go as far as is comfortable). Then slowly slide your foot back down until your leg is flat on the floor again.  °• Angel Wings: Lying on your back spread your legs to the side as far apart as you can without causing discomfort.  °• Hamstring Strength:  Lying on your back, push your heel against the floor with your leg straight by tightening up the   muscles of your buttocks.  Repeat, but this time bend your knee to a comfortable angle, and push your heel against the floor.  You may put a pillow under the heel to make it more comfortable if necessary.  ° °A rehabilitation program following joint replacement surgery can speed recovery and prevent re-injury in the future due to weakened muscles. Contact your doctor or a physical therapist for more information on knee rehabilitation.  ° ° °CONSTIPATION ° °Constipation is defined medically as fewer than three stools per week and severe constipation as less than one stool per week.  Even if you have a regular bowel pattern at home, your normal regimen is likely to be disrupted due to multiple reasons following surgery.  Combination of anesthesia, postoperative narcotics, change in appetite and fluid intake all can affect your bowels.  ° °YOU MUST use at least one of the following options; they are listed in order of increasing strength to get the job done.  They are all available over the counter, and you may need to  use some, POSSIBLY even all of these options:   ° °Drink plenty of fluids (prune juice may be helpful) and high fiber foods °Colace 100 mg by mouth twice a day  °Senokot for constipation as directed and as needed Dulcolax (bisacodyl), take with full glass of water  °Miralax (polyethylene glycol) once or twice a day as needed. ° °If you have tried all these things and are unable to have a bowel movement in the first 3-4 days after surgery call either your surgeon or your primary doctor.   ° °If you experience loose stools or diarrhea, hold the medications until you stool forms back up.  If your symptoms do not get better within 1 week or if they get worse, check with your doctor.  If you experience "the worst abdominal pain ever" or develop nausea or vomiting, please contact the office immediately for further recommendations for treatment. ° ° °ITCHING:  If you experience itching with your medications, try taking only a single pain pill, or even half a pain pill at a time.  You can also use Benadryl over the counter for itching or also to help with sleep.  ° °TED HOSE STOCKINGS:  Use stockings on both legs until for at least 2 weeks or as directed by physician office. They may be removed at night for sleeping. ° °MEDICATIONS:  See your medication summary on the “After Visit Summary” that nursing will review with you.  You may have some home medications which will be placed on hold until you complete the course of blood thinner medication.  It is important for you to complete the blood thinner medication as prescribed. ° °PRECAUTIONS:  If you experience chest pain or shortness of breath - call 911 immediately for transfer to the hospital emergency department.  ° °If you develop a fever greater that 101 F, purulent drainage from wound, increased redness or drainage from wound, foul odor from the wound/dressing, or calf pain - CONTACT YOUR SURGEON.   °                                                °FOLLOW-UP  APPOINTMENTS:  If you do not already have a post-op appointment, please call the office for an appointment to be seen by your surgeon.  Guidelines for how   soon to be seen are listed in your “After Visit Summary”, but are typically between 1-4 weeks after surgery. ° °OTHER INSTRUCTIONS:  ° °Knee Replacement:  Do not place pillow under knee, focus on keeping the knee straight while resting. CPM instructions: 0-90 degrees, 2 hours in the morning, 2 hours in the afternoon, and 2 hours in the evening. Place foam block, curve side up under heel at all times except when in CPM or when walking.  DO NOT modify, tear, cut, or change the foam block in any way. ° °MAKE SURE YOU:  °• Understand these instructions.  °• Get help right away if you are not doing well or get worse.  ° ° °Thank you for letting us be a part of your medical care team.  It is a privilege we respect greatly.  We hope these instructions will help you stay on track for a fast and full recovery!  ° °

## 2015-01-11 NOTE — Discharge Summary (Signed)
Patient ID: [name] MRN: [id number] DOB/AGE: 60-Apr-1957 60 y.o.  Admit date: 01/08/2015 Discharge date: 01/11/2015  Admission Diagnoses:  Principal Problem:   Osteoarthritis of left knee Active Problems:   Status post total left knee replacement   Discharge Diagnoses:  Same  Past Medical History  Diagnosis Date  . Depression   . OCD (obsessive compulsive disorder)   . GERD (gastroesophageal reflux disease)   . Rheumatoid arthritis(714.0)     sees Dr Sharmon Revere @ [facility] in [place]  . Anxiety   . History of kidney stones     x1 15 years ago  . Headache     hx of migraines-none recent  . Restless leg syndrome   . Cataract     left eye    Surgeries: Procedure(s): LEFT TOTAL KNEE ARTHROPLASTY on 01/08/2015   Consultants:    Discharged Condition: Improved  Hospital Course: [name] is an 59 y.o. male who was admitted 01/08/2015 for operative treatment ofOsteoarthritis of left knee. Patient has severe unremitting pain that affects sleep, daily activities, and work/hobbies. After pre-op clearance the patient was taken to the operating room on 01/08/2015 and underwent  Procedure(s): LEFT TOTAL KNEE ARTHROPLASTY.    Patient was given perioperative antibiotics: Anti-infectives    Start     Dose/Rate Route Frequency Ordered Stop   01/08/15 1600  clindamycin (CLEOCIN) IVPB 600 mg     600 mg 100 mL/hr over 30 Minutes Intravenous Every 6 hours 01/08/15 1333 01/08/15 2210   01/08/15 0930  clindamycin (CLEOCIN) IVPB 900 mg     900 mg 100 mL/hr over 30 Minutes Intravenous  Once 01/08/15 0920 01/08/15 1044       Patient was given sequential compression devices, early ambulation, and chemoprophylaxis to prevent DVT.  Patient benefited maximally from hospital stay and there were no complications.    Recent vital signs: Patient Vitals for the past 24 hrs:  BP Temp Temp src Pulse Resp SpO2  01/11/15 0545 133/71 mmHg 98.2 F (36.8 C) Oral 97 18 98 %  01/10/15 2148 (!)  141/82 mmHg 99.8 F (37.7 C) Oral 97 18 98 %  01/10/15 1435 (!) 144/72 mmHg 98.4 F (36.9 C) Oral 100 - 98 %  01/10/15 1145 (!) 160/80 mmHg - - - - -     Recent laboratory studies:  Recent Labs  01/09/15 0455 01/10/15 0522 01/11/15 0413  WBC 11.9* 9.0 8.9  HGB 11.8* 12.0* 11.3*  HCT 34.7* 34.8* 33.4*  PLT 163 158 152  NA 136  --   --   K 3.9  --   --   CL 99*  --   --   CO2 28  --   --   BUN 17  --   --   CREATININE 0.67  --   --   GLUCOSE 123*  --   --   CALCIUM 8.3*  --   --      Discharge Medications:     Medication List    STOP taking these medications        doxycycline 100 MG tablet  Commonly known as:  VIBRA-TABS     HYDROcodone-acetaminophen 7.5-325 MG per tablet  Commonly known as:  NORCO     HYDROmorphone 4 MG tablet  Commonly known as:  DILAUDID     ibuprofen 200 MG tablet  Commonly known as:  ADVIL,MOTRIN      TAKE these medications        cyclobenzaprine 10 MG tablet  Commonly known  as:  FLEXERIL  Take 1 tablet (10 mg total) by mouth 3 (three) times daily as needed for muscle spasms.     DULoxetine 60 MG capsule  Commonly known as:  CYMBALTA  Take 60 mg by mouth every morning.     folic acid 1 MG tablet  Commonly known as:  FOLVITE  Take 1 mg by mouth at bedtime.     ketotifen 0.025 % ophthalmic solution  Commonly known as:  ZADITOR  Place 1 drop into both eyes 2 (two) times daily as needed (Allergies).     leflunomide 10 MG tablet  Commonly known as:  ARAVA  Take 10 mg by mouth at bedtime.     methotrexate 2.5 MG tablet  Commonly known as:  RHEUMATREX  Take 15 mg by mouth every 7 (seven) days. Sunday     methylphenidate 20 MG tablet  Commonly known as:  RITALIN  Take 10 mg by mouth 2 (two) times daily.     omeprazole 40 MG capsule  Commonly known as:  PRILOSEC  Take 40 mg by mouth daily.     oxyCODONE-acetaminophen 5-325 MG per tablet  Commonly known as:  ROXICET  Take 1-2 tablets by mouth every 4 (four) hours as needed.      rivaroxaban 10 MG Tabs tablet  Commonly known as:  XARELTO  Take 1 tablet (10 mg total) by mouth daily with breakfast.     ropinirole 5 MG tablet  Commonly known as:  REQUIP  Take 5 mg by mouth 3 (three) times daily.     tamsulosin 0.4 MG Caps capsule  Commonly known as:  FLOMAX  Take 0.4 mg by mouth 2 (two) times daily.        Diagnostic Studies: Dg Knee Left Port  01/08/2015   CLINICAL DATA:  Status post total knee replacement  EXAM: PORTABLE LEFT KNEE - 1-2 VIEW  COMPARISON:  Left knee MRI August 22, 2014  FINDINGS: Frontal and lateral views were obtained. The patient is status post total knee replacement with prosthetic tibial and femoral components appearing well-seated. No fracture or dislocation. Air within the joint is an expected postoperative finding.  IMPRESSION: Prosthetic components appear well seated. No fracture or dislocation.   Electronically Signed   By: Bretta Bang III M.D.   On: 01/08/2015 12:44    Disposition: 01-Home or Self Care      Discharge Instructions    Discharge patient    Complete by:  As directed            Follow-up Information    Follow up with Kathryne Hitch, MD. Schedule an appointment as soon as possible for a visit in 2 weeks.   Specialty:  Orthopedic Surgery   Contact information:   [address] [place] [address] [phone]        Signed: Kathryne Hitch 01/11/2015, 9:12 AM

## 2015-01-11 NOTE — Progress Notes (Signed)
Physical Therapy Treatment Patient Details Name: [name] MRN: [id number] DOB: 07/03/1956 Today's Date: 01/11/2015    History of Present Illness L TKR; s/p L THR(13) and lumbar fusion(14)    PT Comments    Pt progressing well with ambulation and transfers. L knee flexion ROM is progressing slowly, at 35* AAROM. Encouraged pt to walk frequently at home and to work on flexion exercises. He's ready to DC home from PT standpoint.   Follow Up Recommendations  Home health PT     Equipment Recommendations  Rolling walker with 5" wheels    Recommendations for Other Services OT consult     Precautions / Restrictions Precautions Precautions: Knee;Fall Required Braces or Orthoses: Knee Immobilizer - Left Knee Immobilizer - Left: Discontinue once straight leg raise with < 10 degree lag Restrictions Weight Bearing Restrictions: No Other Position/Activity Restrictions: WBAT    Mobility  Bed Mobility Overal bed mobility: Modified Independent Bed Mobility: Sit to Supine;Supine to Sit     Supine to sit: Modified independent (Device/Increase time) Sit to supine: Modified independent (Device/Increase time)   General bed mobility comments: self assisted using RLE  Transfers Overall transfer level: Modified independent Equipment used: Rolling walker (2 wheeled) Transfers: Sit to/from Stand Sit to Stand: Modified independent (Device/Increase time)         General transfer comment: sit to stand from bed in low position  Ambulation/Gait Ambulation/Gait assistance: Modified independent (Device/Increase time) Ambulation Distance (Feet): 120 Feet Assistive device: Rolling walker (2 wheeled) Gait Pattern/deviations: Step-through pattern;Decreased step length - left;Decreased step length - right Gait velocity: decr       Stairs            Wheelchair Mobility    Modified Rankin (Stroke Patients Only)       Balance                                     Cognition Arousal/Alertness: Awake/alert Behavior During Therapy: WFL for tasks assessed/performed Overall Cognitive Status: Within Functional Limits for tasks assessed                      Exercises Total Joint Exercises Short Arc QuadSinclair Ship;Left;Supine;15 reps Heel Slides: AAROM;10 reps;Supine;Left Hip ABduction/ADduction: AAROM;Left;10 reps;Supine Straight Leg Raises: AAROM;10 reps;Supine;Left;AROM Long Arc Quad: AAROM;Left;10 reps;Seated Knee Flexion: AAROM;Left;10 reps;Seated Goniometric ROM: 0-35* L knee AAROM    General Comments        Pertinent Vitals/Pain Pain Score: 5  Pain Location: L knee Pain Descriptors / Indicators: Sore Pain Intervention(s): Premedicated before session;Monitored during session;Limited activity within patient's tolerance;Ice applied    Home Living                      Prior Function            PT Goals (current goals can now be found in the care plan section) Acute Rehab PT Goals Patient Stated Goal: Home. play with granddaughter PT Goal Formulation: With patient Time For Goal Achievement: 01/16/15 Potential to Achieve Goals: Good Progress towards PT goals: Goals met/education completed, patient discharged from PT    Frequency  7X/week    PT Plan Current plan remains appropriate    Co-evaluation             End of Session Equipment Utilized During Treatment: Gait belt Activity Tolerance: Patient tolerated treatment well Patient left: with call bell/phone within reach;in  chair (entry to bathroom with OT)     Time: 7639-4320 PT Time Calculation (min) (ACUTE ONLY): 29 min  Charges:  $Gait Training: 8-22 mins $Therapeutic Exercise: 8-22 mins                    G Codes:      Philomena Doheny 01/11/2015, 9:56 AM [phone]

## 2015-01-11 NOTE — Progress Notes (Signed)
Patient ID: [name], male   DOB: 24-Dec-1955, 59 y.o.   MRN: [id number] Looks good.  Can discharge to home today.

## 2015-01-11 NOTE — Care Management Note (Signed)
Case Management Note  Patient Details  Name: [name] MRN: [id number] Date of Birth: Jan 05, 1956  Subjective/Objective:             Admitted s/p Left total knee arthroplasty      Action/Plan: Discharge planning  Expected Discharge Date:                  Expected Discharge Plan:  Home w Home Health Services  In-House Referral:     Discharge planning Services  CM Consult  Post Acute Care Choice:    Choice offered to:     DME Arranged:  3-N-1, Walker rolling DME Agency:  [facility]  HH Arranged:  PT [facility] Agency:  [facility]  Status of Service:  Completed, signed off  Medicare Important Message Given:  Yes Date Medicare IM Given:  01/11/15 Medicare IM give by:  Lorenda Ishihara RN CM  Date Additional Medicare IM Given:    Additional Medicare Important Message give by:     If discussed at Long Length of Stay Meetings, dates discussed:    Additional Comments:  Alexis Goodell, RN 01/11/2015, 10:32 AM

## 2015-05-06 ENCOUNTER — Other Ambulatory Visit (HOSPITAL_COMMUNITY): Payer: Self-pay | Admitting: Orthopaedic Surgery

## 2015-05-06 ENCOUNTER — Encounter (HOSPITAL_COMMUNITY): Payer: Self-pay | Admitting: *Deleted

## 2015-05-06 NOTE — Progress Notes (Signed)
Please put orders in Epic for Same day surgery 05-07-15 Thanks 

## 2015-05-07 ENCOUNTER — Inpatient Hospital Stay (HOSPITAL_COMMUNITY): Payer: Medicare HMO | Admitting: Anesthesiology

## 2015-05-07 ENCOUNTER — Inpatient Hospital Stay (HOSPITAL_COMMUNITY)
Admission: RE | Admit: 2015-05-07 | Discharge: 2015-05-10 | DRG: 468 | Disposition: A | Discharge status: Home Health | Payer: Medicare HMO | Source: Ambulatory Visit | Attending: Orthopaedic Surgery | Admitting: Orthopaedic Surgery

## 2015-05-07 ENCOUNTER — Encounter (HOSPITAL_COMMUNITY): Payer: Self-pay | Admitting: *Deleted

## 2015-05-07 ENCOUNTER — Encounter (HOSPITAL_COMMUNITY)
Admission: RE | Disposition: A | Discharge status: Home Health | Payer: Self-pay | Source: Ambulatory Visit | Attending: Orthopaedic Surgery

## 2015-05-07 DIAGNOSIS — M25562 Pain in left knee: Secondary | ICD-10-CM | POA: Diagnosis present

## 2015-05-07 DIAGNOSIS — N189 Chronic kidney disease, unspecified: Secondary | ICD-10-CM | POA: Diagnosis present

## 2015-05-07 DIAGNOSIS — Z880 Allergy status to penicillin: Secondary | ICD-10-CM

## 2015-05-07 DIAGNOSIS — Z87442 Personal history of urinary calculi: Secondary | ICD-10-CM

## 2015-05-07 DIAGNOSIS — T8454XA Infection and inflammatory reaction due to internal left knee prosthesis, initial encounter: Secondary | ICD-10-CM | POA: Diagnosis not present

## 2015-05-07 DIAGNOSIS — F42 Obsessive-compulsive disorder: Secondary | ICD-10-CM | POA: Diagnosis present

## 2015-05-07 DIAGNOSIS — F329 Major depressive disorder, single episode, unspecified: Secondary | ICD-10-CM | POA: Diagnosis present

## 2015-05-07 DIAGNOSIS — M069 Rheumatoid arthritis, unspecified: Secondary | ICD-10-CM | POA: Diagnosis not present

## 2015-05-07 DIAGNOSIS — G2581 Restless legs syndrome: Secondary | ICD-10-CM | POA: Diagnosis present

## 2015-05-07 DIAGNOSIS — Y838 Other surgical procedures as the cause of abnormal reaction of the patient, or of later complication, without mention of misadventure at the time of the procedure: Secondary | ICD-10-CM | POA: Diagnosis present

## 2015-05-07 DIAGNOSIS — B999 Unspecified infectious disease: Secondary | ICD-10-CM | POA: Diagnosis present

## 2015-05-07 DIAGNOSIS — Z96652 Presence of left artificial knee joint: Secondary | ICD-10-CM | POA: Diagnosis present

## 2015-05-07 DIAGNOSIS — K219 Gastro-esophageal reflux disease without esophagitis: Secondary | ICD-10-CM | POA: Diagnosis present

## 2015-05-07 DIAGNOSIS — M25462 Effusion, left knee: Secondary | ICD-10-CM | POA: Diagnosis present

## 2015-05-07 DIAGNOSIS — M25469 Effusion, unspecified knee: Secondary | ICD-10-CM | POA: Diagnosis present

## 2015-05-07 HISTORY — PX: I&D KNEE WITH POLY EXCHANGE: SHX5024

## 2015-05-07 LAB — COMPREHENSIVE METABOLIC PANEL
ALK PHOS: 80 U/L (ref 38–126)
ALT: 47 U/L (ref 17–63)
ANION GAP: 6 (ref 5–15)
AST: 47 U/L — ABNORMAL HIGH (ref 15–41)
Albumin: 3.2 g/dL — ABNORMAL LOW (ref 3.5–5.0)
BILIRUBIN TOTAL: 0.9 mg/dL (ref 0.3–1.2)
BUN: 22 mg/dL — ABNORMAL HIGH (ref 6–20)
CALCIUM: 8.5 mg/dL — AB (ref 8.9–10.3)
CO2: 26 mmol/L (ref 22–32)
CREATININE: 0.75 mg/dL (ref 0.61–1.24)
Chloride: 103 mmol/L (ref 101–111)
GFR calc non Af Amer: >60 mL/min (ref >60)
Glucose, Bld: 160 mg/dL — ABNORMAL HIGH (ref 65–99)
Potassium: 4.3 mmol/L (ref 3.5–5.1)
Sodium: 135 mmol/L (ref 135–145)
TOTAL PROTEIN: 6.9 g/dL (ref 6.5–8.1)

## 2015-05-07 LAB — CBC
HCT: 35.8 % — ABNORMAL LOW (ref 39.0–52.0)
Hemoglobin: 12 g/dL — ABNORMAL LOW (ref 13.0–17.0)
MCH: 29.9 pg (ref 26.0–34.0)
MCHC: 33.5 g/dL (ref 30.0–36.0)
MCV: 89.1 fL (ref 78.0–100.0)
PLATELETS: 269 10*3/uL (ref 150–400)
RBC: 4.02 MIL/uL — ABNORMAL LOW (ref 4.22–5.81)
RDW: 13.5 % (ref 11.5–15.5)
WBC: 7.8 10*3/uL (ref 4.0–10.5)

## 2015-05-07 SURGERY — IRRIGATION AND DEBRIDEMENT KNEE WITH POLY EXCHANGE
Anesthesia: General | Site: Knee | Laterality: Left

## 2015-05-07 MED ORDER — SUFENTANIL CITRATE 50 MCG/ML IV SOLN
INTRAVENOUS | Status: DC | PRN
  Administered 2015-05-07 (×5): 10 ug via INTRAVENOUS

## 2015-05-07 MED ORDER — LIDOCAINE HCL (CARDIAC) 20 MG/ML IV SOLN
INTRAVENOUS | Status: DC | PRN
  Administered 2015-05-07: 100 mg via INTRAVENOUS

## 2015-05-07 MED ORDER — VANCOMYCIN HCL IN DEXTROSE 1-5 GM/200ML-% IV SOLN
1000.0000 mg | Freq: Once | INTRAVENOUS | Status: AC
  Administered 2015-05-07: 1000 mg via INTRAVENOUS

## 2015-05-07 MED ORDER — FENTANYL CITRATE (PF) 100 MCG/2ML IJ SOLN
INTRAMUSCULAR | Status: AC
  Filled 2015-05-07: qty 2

## 2015-05-07 MED ORDER — METOCLOPRAMIDE HCL 5 MG/ML IJ SOLN: 5.0000 mg | Freq: Three times a day (TID) | INTRAMUSCULAR | Status: DC | PRN

## 2015-05-07 MED ORDER — PRAMIPEXOLE DIHYDROCHLORIDE 0.25 MG PO TABS
0.2500 mg | ORAL_TABLET | Freq: Two times a day (BID) | ORAL | Status: DC
  Administered 2015-05-07 – 2015-05-10 (×6): 0.25 mg via ORAL
  Filled 2015-05-07 (×7): qty 1

## 2015-05-07 MED ORDER — DULOXETINE HCL 60 MG PO CPEP
60.0000 mg | ORAL_CAPSULE | Freq: Every morning | ORAL | Status: DC
  Administered 2015-05-08 – 2015-05-10 (×3): 60 mg via ORAL
  Filled 2015-05-07 (×3): qty 1

## 2015-05-07 MED ORDER — ESMOLOL HCL 10 MG/ML IV SOLN
INTRAVENOUS | Status: DC | PRN
  Administered 2015-05-07: 30 mg via INTRAVENOUS

## 2015-05-07 MED ORDER — SUCCINYLCHOLINE CHLORIDE 20 MG/ML IJ SOLN
INTRAMUSCULAR | Status: DC | PRN
  Administered 2015-05-07: 100 mg via INTRAVENOUS

## 2015-05-07 MED ORDER — ROCURONIUM BROMIDE 100 MG/10ML IV SOLN
INTRAVENOUS | Status: DC | PRN
  Administered 2015-05-07: 30 mg via INTRAVENOUS

## 2015-05-07 MED ORDER — PROPOFOL 10 MG/ML IV BOLUS
INTRAVENOUS | Status: AC
  Filled 2015-05-07: qty 20

## 2015-05-07 MED ORDER — TRAMADOL HCL 50 MG PO TABS: 50.0000 mg | ORAL_TABLET | Freq: Four times a day (QID) | ORAL | Status: DC | PRN

## 2015-05-07 MED ORDER — ACETAMINOPHEN 650 MG RE SUPP: 650.0000 mg | Freq: Four times a day (QID) | RECTAL | Status: DC | PRN

## 2015-05-07 MED ORDER — 0.9 % SODIUM CHLORIDE (POUR BTL) OPTIME
TOPICAL | Status: DC | PRN
  Administered 2015-05-07: 1000 mL

## 2015-05-07 MED ORDER — HYDROMORPHONE HCL 1 MG/ML IJ SOLN
1.0000 mg | INTRAMUSCULAR | Status: DC | PRN
  Administered 2015-05-07 – 2015-05-09 (×3): 1 mg via INTRAVENOUS
  Filled 2015-05-07 (×3): qty 1

## 2015-05-07 MED ORDER — LACTATED RINGERS IV SOLN
INTRAVENOUS | Status: DC
  Administered 2015-05-07: 20:00:00 via INTRAVENOUS

## 2015-05-07 MED ORDER — SODIUM CHLORIDE 0.9 % IJ SOLN
INTRAMUSCULAR | Status: AC
  Filled 2015-05-07: qty 10

## 2015-05-07 MED ORDER — ONDANSETRON HCL 4 MG PO TABS: 4.0000 mg | ORAL_TABLET | Freq: Four times a day (QID) | ORAL | Status: DC | PRN

## 2015-05-07 MED ORDER — HYDROMORPHONE HCL 1 MG/ML IJ SOLN
INTRAMUSCULAR | Status: DC | PRN
  Administered 2015-05-07 (×4): 0.5 mg via INTRAVENOUS

## 2015-05-07 MED ORDER — PANTOPRAZOLE SODIUM 40 MG PO TBEC
80.0000 mg | DELAYED_RELEASE_TABLET | Freq: Every day | ORAL | Status: DC
  Administered 2015-05-08 – 2015-05-10 (×3): 80 mg via ORAL
  Filled 2015-05-07 (×3): qty 2

## 2015-05-07 MED ORDER — DEXAMETHASONE SODIUM PHOSPHATE 10 MG/ML IJ SOLN
INTRAMUSCULAR | Status: DC | PRN
  Administered 2015-05-07: 10 mg via INTRAVENOUS

## 2015-05-07 MED ORDER — HYDROMORPHONE HCL 1 MG/ML IJ SOLN
0.2500 mg | INTRAMUSCULAR | Status: DC | PRN
  Administered 2015-05-07 (×4): 0.5 mg via INTRAVENOUS

## 2015-05-07 MED ORDER — SUGAMMADEX SODIUM 200 MG/2ML IV SOLN
INTRAVENOUS | Status: DC | PRN
  Administered 2015-05-07: 100 mg via INTRAVENOUS

## 2015-05-07 MED ORDER — TAMSULOSIN HCL 0.4 MG PO CAPS
0.4000 mg | ORAL_CAPSULE | Freq: Two times a day (BID) | ORAL | Status: DC
  Administered 2015-05-07 – 2015-05-10 (×6): 0.4 mg via ORAL
  Filled 2015-05-07 (×7): qty 1

## 2015-05-07 MED ORDER — PROPOFOL 10 MG/ML IV BOLUS
INTRAVENOUS | Status: DC | PRN
  Administered 2015-05-07: 170 mg via INTRAVENOUS

## 2015-05-07 MED ORDER — KETAMINE HCL 10 MG/ML IJ SOLN
INTRAMUSCULAR | Status: AC
  Filled 2015-05-07: qty 1

## 2015-05-07 MED ORDER — LACTATED RINGERS IV SOLN
INTRAVENOUS | Status: DC
  Administered 2015-05-07: 19:00:00 via INTRAVENOUS
  Administered 2015-05-07: 1000 mL via INTRAVENOUS

## 2015-05-07 MED ORDER — KETAMINE HCL 10 MG/ML IJ SOLN
INTRAMUSCULAR | Status: DC | PRN
  Administered 2015-05-07: 30 mg via INTRAVENOUS
  Administered 2015-05-07 (×2): 10 mg via INTRAVENOUS

## 2015-05-07 MED ORDER — SODIUM CHLORIDE 0.9 % IV SOLN
INTRAVENOUS | Status: DC
  Administered 2015-05-07: 23:00:00 via INTRAVENOUS
  Administered 2015-05-09: 20 mL/h via INTRAVENOUS

## 2015-05-07 MED ORDER — HYDROMORPHONE HCL 1 MG/ML IJ SOLN
INTRAMUSCULAR | Status: AC
  Filled 2015-05-07: qty 1

## 2015-05-07 MED ORDER — DIPHENHYDRAMINE HCL 12.5 MG/5ML PO ELIX: 12.5000 mg | ORAL_SOLUTION | ORAL | Status: DC | PRN

## 2015-05-07 MED ORDER — LEFLUNOMIDE 20 MG PO TABS
10.0000 mg | ORAL_TABLET | Freq: Every day | ORAL | Status: DC
  Administered 2015-05-07 – 2015-05-09 (×3): 10 mg via ORAL
  Filled 2015-05-07 (×4): qty 1

## 2015-05-07 MED ORDER — ONDANSETRON HCL 4 MG/2ML IJ SOLN
INTRAMUSCULAR | Status: DC | PRN
  Administered 2015-05-07: 4 mg via INTRAVENOUS

## 2015-05-07 MED ORDER — VANCOMYCIN HCL IN DEXTROSE 750-5 MG/150ML-% IV SOLN
750.0000 mg | Freq: Three times a day (TID) | INTRAVENOUS | Status: DC
  Administered 2015-05-08 – 2015-05-09 (×6): 750 mg via INTRAVENOUS
  Filled 2015-05-07 (×6): qty 150

## 2015-05-07 MED ORDER — MEPERIDINE HCL 50 MG/ML IJ SOLN: 6.2500 mg | INTRAMUSCULAR | Status: DC | PRN

## 2015-05-07 MED ORDER — VANCOMYCIN HCL IN DEXTROSE 1-5 GM/200ML-% IV SOLN
INTRAVENOUS | Status: AC
  Filled 2015-05-07: qty 200

## 2015-05-07 MED ORDER — ACETAMINOPHEN 325 MG PO TABS: 650.0000 mg | ORAL_TABLET | Freq: Four times a day (QID) | ORAL | Status: DC | PRN

## 2015-05-07 MED ORDER — DEXAMETHASONE SODIUM PHOSPHATE 10 MG/ML IJ SOLN
INTRAMUSCULAR | Status: AC
  Filled 2015-05-07: qty 1

## 2015-05-07 MED ORDER — ACYCLOVIR 800 MG PO TABS
800.0000 mg | ORAL_TABLET | Freq: Three times a day (TID) | ORAL | Status: DC
  Administered 2015-05-07 – 2015-05-10 (×8): 800 mg via ORAL
  Filled 2015-05-07 (×10): qty 1

## 2015-05-07 MED ORDER — CYCLOBENZAPRINE HCL 10 MG PO TABS
10.0000 mg | ORAL_TABLET | Freq: Three times a day (TID) | ORAL | Status: DC | PRN
  Administered 2015-05-07 – 2015-05-10 (×5): 10 mg via ORAL
  Filled 2015-05-07 (×5): qty 1

## 2015-05-07 MED ORDER — OXYCODONE HCL 5 MG PO TABS
5.0000 mg | ORAL_TABLET | ORAL | Status: DC | PRN
Start: 2015-05-07 — End: 2015-05-10
  Administered 2015-05-07 – 2015-05-10 (×11): 10 mg via ORAL
  Filled 2015-05-07 (×11): qty 2

## 2015-05-07 MED ORDER — LIDOCAINE HCL (CARDIAC) 20 MG/ML IV SOLN
INTRAVENOUS | Status: AC
  Filled 2015-05-07: qty 5

## 2015-05-07 MED ORDER — ROPINIROLE HCL 1 MG PO TABS
5.0000 mg | ORAL_TABLET | Freq: Three times a day (TID) | ORAL | Status: DC
  Administered 2015-05-07 – 2015-05-10 (×8): 5 mg via ORAL
  Filled 2015-05-07 (×10): qty 5

## 2015-05-07 MED ORDER — PROMETHAZINE HCL 25 MG/ML IJ SOLN: 6.2500 mg | INTRAMUSCULAR | Status: DC | PRN

## 2015-05-07 MED ORDER — FOLIC ACID 1 MG PO TABS
1.0000 mg | ORAL_TABLET | Freq: Every day | ORAL | Status: DC
  Administered 2015-05-07 – 2015-05-09 (×3): 1 mg via ORAL
  Filled 2015-05-07 (×4): qty 1

## 2015-05-07 MED ORDER — FENTANYL CITRATE (PF) 100 MCG/2ML IJ SOLN
INTRAMUSCULAR | Status: DC | PRN
  Administered 2015-05-07: 50 ug via INTRAVENOUS

## 2015-05-07 MED ORDER — SODIUM CHLORIDE 0.9 % IR SOLN
Status: DC | PRN
Start: 2015-05-07 — End: 2015-05-07
  Administered 2015-05-07: 6000 mL

## 2015-05-07 MED ORDER — HYDROMORPHONE HCL 2 MG/ML IJ SOLN
INTRAMUSCULAR | Status: AC
  Filled 2015-05-07: qty 1

## 2015-05-07 MED ORDER — METHOTREXATE (ANTI-RHEUMATIC) 2.5 MG PO TABS
15.0000 mg | ORAL_TABLET | ORAL | Status: DC
  Administered 2015-05-09: 15 mg via ORAL
  Filled 2015-05-07 (×2): qty 6

## 2015-05-07 MED ORDER — METOCLOPRAMIDE HCL 10 MG PO TABS: 5.0000 mg | ORAL_TABLET | Freq: Three times a day (TID) | ORAL | Status: DC | PRN

## 2015-05-07 MED ORDER — ONDANSETRON HCL 4 MG/2ML IJ SOLN: 4.0000 mg | Freq: Four times a day (QID) | INTRAMUSCULAR | Status: DC | PRN

## 2015-05-07 MED ORDER — ROCURONIUM BROMIDE 100 MG/10ML IV SOLN
INTRAVENOUS | Status: AC
  Filled 2015-05-07: qty 1

## 2015-05-07 MED ORDER — SUGAMMADEX SODIUM 200 MG/2ML IV SOLN
INTRAVENOUS | Status: AC
  Filled 2015-05-07: qty 2

## 2015-05-07 MED ORDER — IBUPROFEN 200 MG PO TABS
800.0000 mg | ORAL_TABLET | Freq: Four times a day (QID) | ORAL | Status: DC | PRN
  Administered 2015-05-09 (×2): 800 mg via ORAL
  Filled 2015-05-07 (×2): qty 4

## 2015-05-07 MED ORDER — SUFENTANIL CITRATE 50 MCG/ML IV SOLN
INTRAVENOUS | Status: AC
  Filled 2015-05-07: qty 1

## 2015-05-07 MED ORDER — ONDANSETRON HCL 4 MG/2ML IJ SOLN
INTRAMUSCULAR | Status: AC
  Filled 2015-05-07: qty 2

## 2015-05-07 SURGICAL SUPPLY — 67 items
ANCH SUT 2 5.5 BABSR ASCP (Orthopedic Implant) ×1 IMPLANT
ANCHOR PEEK ZIP 5.5 NDL NO2 (Orthopedic Implant) ×3 IMPLANT
BAG SPEC THK2 15X12 ZIP CLS (MISCELLANEOUS) ×1
BAG ZIPLOCK 12X15 (MISCELLANEOUS) ×3 IMPLANT
BANDAGE ELASTIC 4 VELCRO ST LF (GAUZE/BANDAGES/DRESSINGS) ×1 IMPLANT
BANDAGE ELASTIC 6 VELCRO ST LF (GAUZE/BANDAGES/DRESSINGS) ×3 IMPLANT
BANDAGE ESMARK 6X9 LF (GAUZE/BANDAGES/DRESSINGS) ×1 IMPLANT
BEARIN INSERT TIBIAL SZ5 9 (Orthopedic Implant) ×2 IMPLANT
BEARIN INSERT TIBIAL SZ5 9MM (Orthopedic Implant) ×1 IMPLANT
BEARING INSERT TIBIAL SZ5 9 (Orthopedic Implant) IMPLANT
BNDG CMPR 9X6 STRL LF SNTH (GAUZE/BANDAGES/DRESSINGS) ×1
BNDG ESMARK 6X9 LF (GAUZE/BANDAGES/DRESSINGS) ×3
CLOSURE WOUND 1/2 X4 (GAUZE/BANDAGES/DRESSINGS) ×1
CUFF TOURN SGL QUICK 34 (TOURNIQUET CUFF) ×3
CUFF TRNQT CYL 34X4X40X1 (TOURNIQUET CUFF) ×1 IMPLANT
DECANTER SPIKE VIAL GLASS SM (MISCELLANEOUS) ×1 IMPLANT
DRAPE EXTREMITY T 121X128X90 (DRAPE) ×3 IMPLANT
DRAPE POUCH INSTRU U-SHP 10X18 (DRAPES) ×3 IMPLANT
DRAPE U-SHAPE 47X51 STRL (DRAPES) ×3 IMPLANT
DRSG ADAPTIC 3X8 NADH LF (GAUZE/BANDAGES/DRESSINGS) ×1 IMPLANT
DRSG EMULSION OIL 3X16 NADH (GAUZE/BANDAGES/DRESSINGS) ×2 IMPLANT
DRSG PAD ABDOMINAL 8X10 ST (GAUZE/BANDAGES/DRESSINGS) ×2 IMPLANT
DRSG XEROFORM 1X8 (GAUZE/BANDAGES/DRESSINGS) ×2 IMPLANT
ELECT REM PT RETURN 9FT ADLT (ELECTROSURGICAL) ×3
ELECTRODE REM PT RTRN 9FT ADLT (ELECTROSURGICAL) ×1 IMPLANT
EVACUATOR 1/8 PVC DRAIN (DRAIN) ×3 IMPLANT
EVACUATOR SILICONE 100CC (DRAIN) ×1 IMPLANT
FACESHIELD WRAPAROUND (MASK) ×9 IMPLANT
FACESHIELD WRAPAROUND OR TEAM (MASK) ×5 IMPLANT
FLOSEAL 10ML (HEMOSTASIS) ×3 IMPLANT
GAUZE SPONGE 4X4 12PLY STRL (GAUZE/BANDAGES/DRESSINGS) ×4 IMPLANT
GLOVE BIOGEL PI IND STRL 8 (GLOVE) ×1 IMPLANT
GLOVE BIOGEL PI INDICATOR 8 (GLOVE) ×2
GLOVE ECLIPSE 8.0 STRL XLNG CF (GLOVE) ×3 IMPLANT
GLOVE ORTHO TXT STRL SZ7.5 (GLOVE) ×6 IMPLANT
GLOVE SURG ORTHO 8.0 STRL STRW (GLOVE) ×6 IMPLANT
GOWN STRL REUS W/TWL XL LVL3 (GOWN DISPOSABLE) ×3 IMPLANT
HANDPIECE INTERPULSE COAX TIP (DISPOSABLE) ×3
IMMOBILIZER KNEE 20 (SOFTGOODS) ×3 IMPLANT
IMMOBILIZER KNEE 20 THIGH 36 (SOFTGOODS) IMPLANT
KIT BASIN OR (CUSTOM PROCEDURE TRAY) ×3 IMPLANT
NDL SAFETY ECLIPSE 18X1.5 (NEEDLE) ×1 IMPLANT
NEEDLE HYPO 18GX1.5 SHARP (NEEDLE)
NS IRRIG 1000ML POUR BTL (IV SOLUTION) ×4 IMPLANT
PACK TOTAL JOINT (CUSTOM PROCEDURE TRAY) ×3 IMPLANT
PAD ABD 8X10 STRL (GAUZE/BANDAGES/DRESSINGS) ×4 IMPLANT
PADDING CAST ABS 6INX4YD NS (CAST SUPPLIES) ×4
PADDING CAST ABS COTTON 6X4 NS (CAST SUPPLIES) IMPLANT
PADDING CAST COTTON 6X4 STRL (CAST SUPPLIES) ×2 IMPLANT
POSITIONER SURGICAL ARM (MISCELLANEOUS) ×3 IMPLANT
SET HNDPC FAN SPRY TIP SCT (DISPOSABLE) ×1 IMPLANT
SPONGE LAP 18X18 X RAY DECT (DISPOSABLE) ×3 IMPLANT
STAPLER VISISTAT 35W (STAPLE) ×2 IMPLANT
STRIP CLOSURE SKIN 1/2X4 (GAUZE/BANDAGES/DRESSINGS) ×3 IMPLANT
SUCTION FRAZIER 12FR DISP (SUCTIONS) ×3 IMPLANT
SUT MNCRL AB 4-0 PS2 18 (SUTURE) ×3 IMPLANT
SUT VIC AB 1 CT1 36 (SUTURE) ×9 IMPLANT
SUT VIC AB 2-0 CT1 27 (SUTURE) ×9
SUT VIC AB 2-0 CT1 TAPERPNT 27 (SUTURE) ×3 IMPLANT
SWAB COLLECTION DEVICE MRSA (MISCELLANEOUS) ×3 IMPLANT
SYR 50ML LL SCALE MARK (SYRINGE) ×1 IMPLANT
SYR CONTROL 10ML LL (SYRINGE) ×1 IMPLANT
TOWEL OR 17X26 10 PK STRL BLUE (TOWEL DISPOSABLE) ×5 IMPLANT
TRAY FOLEY W/METER SILVER 14FR (SET/KITS/TRAYS/PACK) ×1 IMPLANT
TRAY FOLEY W/METER SILVER 16FR (SET/KITS/TRAYS/PACK) ×1 IMPLANT
TUBE ANAEROBIC SPECIMEN COL (MISCELLANEOUS) ×3 IMPLANT
WATER STERILE IRR 1500ML POUR (IV SOLUTION) ×1 IMPLANT

## 2015-05-07 NOTE — Op Note (Signed)
NAMELINDA, BIEHN             ACCOUNT NO.:  [id number]  MEDICAL RECORD NO.:  [id number]  LOCATION:  [facility]                         FACILITY:  [facility]  PHYSICIAN:  Vanita Panda. Magnus Ivan, M.D.DATE OF BIRTH:  07-May-1956  DATE OF PROCEDURE:  05/07/2015 DATE OF DISCHARGE:                              OPERATIVE REPORT   PREOPERATIVE DIAGNOSES:  Acute left knee effusion with questionable infection and history of a left total knee arthroplasty.  POSTOPERATIVE DIAGNOSES:  Acute left knee effusion with questionable infection and history of a left total knee arthroplasty.  PROCEDURE: 1. Irrigation and debridement including synovectomy of left total knee     joint/arthroplasty. 2. Poly liner exchange, left knee.  FINDINGS:  Large effusion, left knee joint.  Gram stain and cultures pending.  SURGEON:  Vanita Panda. Magnus Ivan, M.D.  ASSISTANT:  Richardean Canal, PA-C.  ANESTHESIA:  General.  BLOOD LOSS:  Less than 100 mL.  COMPLICATIONS:  None.  TOURNIQUET TIME:  Less than 1 hour.  ANTIBIOTICS:  1 g IV vancomycin after cultures obtained.  INDICATIONS:  Dionisios is a 59 year old gentleman who in May of this year underwent an uncomplicated left total knee arthroplasty.  He does have severe rheumatoid disease and his postoperative course was uneventful. We saw him in the office several times.  Following surgery, he was doing well.  Last week, he developed what it was felt by his primary care physician, to be a hand, foot and mouth disease and was started on antibiotics.  He only took a day or so worth of antibiotics because he was sent to an Ear, Nose, and Throat physician who felt that this was more of a viral process.  He was started on acyclovir.  Monday of this week, he awoke with a left knee effusion and pain.  We saw in the office on Wednesday, aspirated the knee, and found 12,000 white blood cell count with no organism.  There was not a significant left shift either. His sed  rate was 55 and a CRP was 12 and after his labs came back, I felt that he would benefit from at least irrigation, debridement, and synovectomy of his knee and polyethylene exchange with the hope that the cultures remain negative and this could avoid treatment of long-term antibiotics.  He does understand that he may still need 6 weeks of IV antibiotics pending our intraoperative findings.  The risks and benefits were explained him in detail and he did understand the need to proceed with surgery.  DESCRIPTION OF PROCEDURE:  After informed consent was obtained, appropriate left knee was marked.  He was brought to the operating room, placed supine on the operating table.  General anesthesia was then obtained.  A nonsterile tourniquet was placed around his upper left leg. His left leg was prepped and draped with DuraPrep and sterile drapes. Time-out was called to identify correct patient, correct left knee.  I then made an incision directly over the patella through his previous incision, carried this proximally, distally and performed a medial parapatellar arthrotomy and found a large joint effusion.  We obtained cultures from this and then gave him a gram of vancomycin.  It did not appear to be grossly purulent,  but it was definitely an inflammatory process going on his knee.  We removed abundant amount of synovium easily with a rongeur and a knife sharply, again debriding only synovium.  We then removed his previous polyethylene to get behind the knee and removed as much synovium as we could from posterior aspect of the knee.  We irrigated 6 L of normal saline solution using pulsatile lavage throughout the knee itself.  On inverting the patella, the patellar tendon did pull off the bone a little bit, so I felt it would be worthwhile placing a 5.5 mm suture anchor with sutures to just provide some reinforcement with attending closure.  We then changed out his polyethylene liner and placed a  new 9 mm thickness polyethylene liner for a size 5 tibial tray for [employer]ryker knee.  We placed a medium Hemovac in the arthrotomy and closed the arthrotomy with interrupted #1 Vicryl suture, followed by 0 Vicryl in the deep tissues, 2-0 Vicryl in the subcutaneous tissue, and staples on the skin.  Well-padded sterile dressings applied.  He was awakened, extubated, and taken to the recovery room in stable condition.  All final counts were correct.  No complications noted.  He will be admitted now for inpatient IV antibiotics and following his cultures closely to determine what his long-term treatment maybe.  Of note,  Richardean Canal, PA-C, assisted in the entire case.  His assistance was crucial for facilitating all aspects of this case.     Vanita Panda. Magnus Ivan, M.D.     CYB/MEDQ  D:  05/07/2015  T:  05/07/2015  Job:  [id number]

## 2015-05-07 NOTE — Brief Op Note (Signed)
05/07/2015  7:26 PM  PATIENT:  Jaidon Naff  59 y.o. male  PRE-OPERATIVE DIAGNOSIS:  Questionable infected left total knee  POST-OPERATIVE DIAGNOSIS:  Questionable infected left total knee  PROCEDURE:  Procedure(s): IRRIGATION AND DEBRIDEMENT LEFT KNEE WITH POLY EXCHANGE (Left)  SURGEON:  Surgeon(s) and Role:    * Kathryne Hitch, MD - Primary  PHYSICIAN ASSISTANT: Rexene Edison, PA-C  ANESTHESIA:   general  EBL:   100 cc  BLOOD ADMINISTERED:none  DRAINS: (medium) Hemovact drain(s) in the knee with  Suction Open   LOCAL MEDICATIONS USED:  NONE  SPECIMEN:  Aspirate  DISPOSITION OF SPECIMEN:  N/A  COUNTS:  YES  TOURNIQUET:  * Missing tourniquet times found for documented tourniquets in log:  [id number] *  DICTATION: .Other Dictation: Dictation Number [id number]  PLAN OF CARE: Admit to inpatient   PATIENT DISPOSITION:  PACU - hemodynamically stable.   Delay start of Pharmacological VTE agent (>24hrs) due to surgical blood loss or risk of bleeding: no

## 2015-05-07 NOTE — H&P (Signed)
[name] is an 59 y.o. male.   Chief Complaint: painful left knee with effusion HPI:   59 yo male with rheumatoid disease and the history of a successful left total knee replacement on May of this year.  Had been doing well with no issues until about a week ago when he developed what was thought to be Hand, Foot, Mouth disease.  Was started by his PCP on Cleocin and then saw a ENT specialist who stopped the cleocin and started Acyclovir feeling that it was viral instead.  On Monday of this week, awoke with left knee pain and swelling.  He was seen this Wednesday in our office and we aspirated the knee.  Thus far no organisms were seen on gram stain and cultures are pending.  However, the WBC from the knee aspirate showed 12,000 WBCs.  His ESR and CR-P are also elevated, so I feel it is appropriate that we should be aggressive and wash his knee out and change out the poly liner.  I have spoken to him in length and he agrees with this as well given his continued swelling and pain.  Past Medical History  Diagnosis Date  . Depression   . OCD (obsessive compulsive disorder)   . GERD (gastroesophageal reflux disease)   . Anxiety   . History of kidney stones     x1 15 years ago  . Restless leg syndrome   . Cataract     left eye  . Chronic kidney disease     Kidney stone once  . Headache     hx of migraines-none recent  . Rheumatoid arthritis(714.0)     sees Dr Gerilyn Nestle @ [facility] in [facility]    Past Surgical History  Procedure Laterality Date  . Total hip arthroplasty Right 01/2012    done in [facility]  . Hand tendon surgery Right 06/2012    done @ [facility] in [place]  . Rotator cuff repair Right 2008     done in [facility]  . Anterior lumbar fusion  09/13/2012    Procedure: ANTERIOR LUMBAR FUSION 1 LEVEL;  Surgeon: Faythe Ghee, MD;  Location: [place] NEURO ORS;  Service: Neurosurgery;  Laterality: N/A;  lumbar four-five  . Anterior lat lumbar fusion  09/13/2012    Procedure: ANTERIOR  LATERAL LUMBAR FUSION 2 LEVELS;  Surgeon: Faythe Ghee, MD;  Location: [place] NEURO ORS;  Service: Neurosurgery;  Laterality: Right;  lumbar two three,three-four  . Lumbar percutaneous pedicle screw 2 level  09/13/2012    Procedure: LUMBAR PERCUTANEOUS PEDICLE SCREW 2 LEVEL;  Surgeon: Faythe Ghee, MD;  Location: [facility] NEURO ORS;  Service: Neurosurgery;  Laterality: Left;  left pedicle screws two -three,three-four  . Abdominal exposure  09/13/2012    Procedure: ABDOMINAL EXPOSURE;  Surgeon: Rosetta Posner, MD;  Location: [facility] NEURO ORS;  Service: Vascular;  Laterality: N/A;  . Total knee arthroplasty Left 01/08/2015    Procedure: LEFT TOTAL KNEE ARTHROPLASTY;  Surgeon: Mcarthur Rossetti, MD;  Location: [facility] ORS;  Service: Orthopedics;  Laterality: Left;    History reviewed. No pertinent family history. Social History:  reports that he has never smoked. He has never used smokeless tobacco. He reports that he drinks alcohol. He reports that he does not use illicit drugs.  Allergies:  Allergies  Allergen Reactions  . Penicillins Rash    Has patient had a PCN reaction causing immediate rash, facial/tongue/throat swelling, SOB or lightheadedness with hypotension: Yes Has patient had a PCN reaction  causing severe rash involving mucus membranes or skin necrosis: No- around scars.  Has patient had a PCN reaction that required hospitalization No Has patient had a PCN reaction occurring within the last 10 years: No If all of the above answers are "NO", then may proceed with Cephalosporin use.     No prescriptions prior to admission    No results found for this or any previous visit (from the past 48 hour(s)). No results found.  Review of Systems  Musculoskeletal: Positive for joint pain.  All other systems reviewed and are negative.   There were no vitals taken for this visit. Physical Exam   Assessment/Plan Left knee painful acute effusion worrisome for infection post a left total knee  replacement 1)  To the OR today for irrigation and debridement, synovectomy, and poly liner exchange of the left knee followed by admission for IV antibiotics.  Mcarthur Rossetti 05/07/2015, 9:31 AM

## 2015-05-07 NOTE — Transfer of Care (Signed)
Immediate Anesthesia Transfer of Care Note  Patient: [name]  Procedure(s) Performed: Procedure(s): IRRIGATION AND DEBRIDEMENT LEFT KNEE WITH POLY EXCHANGE (Left)  Patient Location: PACU  Anesthesia Type:General  Level of Consciousness:  sedated, patient cooperative and responds to stimulation  Airway & Oxygen Therapy:Patient Spontanous Breathing and Patient connected to face mask oxgen  Post-op Assessment:  Report given to PACU RN and Post -op Vital signs reviewed and stable  Post vital signs:  Reviewed and stable  Last Vitals:  Filed Vitals:   05/07/15 2006  BP: 169/101  Pulse: 95  Temp:   Resp: 18    Complications: No apparent anesthesia complications

## 2015-05-07 NOTE — Progress Notes (Signed)
ANTIBIOTIC CONSULT NOTE - INITIAL  Pharmacy Consult for Vancomycin Indication: Possible infected left total knee arthroplasty  Allergies  Allergen Reactions  . Penicillins Rash    Has patient had a PCN reaction causing immediate rash, facial/tongue/throat swelling, SOB or lightheadedness with hypotension: Yes Has patient had a PCN reaction causing severe rash involving mucus membranes or skin necrosis: No- around scars.  Has patient had a PCN reaction that required hospitalization No Has patient had a PCN reaction occurring within the last 10 years: No If all of the above answers are "NO", then may proceed with Cephalosporin use.     Patient Measurements: Height: _0  (170.2 cm) Weight: 158 lb (71.668 kg) IBW/kg (Calculated) : 66.1 Adjusted Body Weight:   Vital Signs: Temp: 98.3 F (36.8 C) (09/23 2115) Temp Source: Oral (09/23 1503) BP: 149/110 mmHg (09/23 2115) Pulse Rate: 96 (09/23 2115) Intake/Output from previous day:   Intake/Output from this shift: Total I/O In: 700 [I.V.:700] Out: -   Labs:  Recent Labs  05/07/15 1523  WBC 7.8  HGB 12.0*  PLT 269   CrCl cannot be calculated (Patient has no serum creatinine result on file.). No results for input(s): VANCOTROUGH, VANCOPEAK, VANCORANDOM, GENTTROUGH, GENTPEAK, GENTRANDOM, TOBRATROUGH, TOBRAPEAK, TOBRARND, AMIKACINPEAK, AMIKACINTROU, AMIKACIN in the last 72 hours.   Microbiology: No results found for this or any previous visit (from the past 720 hour(s)).  Medical History: Past Medical History  Diagnosis Date  . Depression   . OCD (obsessive compulsive disorder)   . GERD (gastroesophageal reflux disease)   . Anxiety   . History of kidney stones     x1 15 years ago  . Restless leg syndrome   . Cataract     left eye  . Chronic kidney disease     Kidney stone once  . Headache     hx of migraines-none recent  . Rheumatoid arthritis(714.0)     sees Dr Gerilyn Nestle @ [facility] in [facility]   Assessment: 41  yoM with rheumatoid disease and s/p left TKR in May 2016 presents with painful left knee and effusion.  Knee aspiration shows elevated WBCs and ESR/CRP also elevated worrisome for infection. Pt underwent I/D, synovectomy, and poly liner exchange of left knee on 9/23 and pharmacy consulted to start vancomycin post op.  Pt also on acyclovir for possible viral infection. First dose vancomycin given pre-op.   Anti-infectives 9/23 >> Vancomycin  >>  Vitals/Labs WBC: WNL Tm24h: Afebrile Renal: No SCr this admission yet.   Cultures 9/23 Blood cx x1: IP 9/23 Wound cx: IP 9/23 Anaerobic wound cx: IP  Goal of Therapy:  Vancomycin trough level 15-20 mcg/ml  Plan:  CMET STAT.  F/u renal function and re-dose vancomycin as appropriate.   Ralene Bathe, PharmD, BCPS 05/07/2015, 9:55 PM  Pager: [phone]

## 2015-05-07 NOTE — Anesthesia Preprocedure Evaluation (Addendum)
Anesthesia Evaluation  Patient identified by MRN, date of birth, ID band Patient awake    Reviewed: Allergy & Precautions, NPO status , Patient's Chart, lab work & pertinent test results  Airway Mallampati: II  TM Distance: >3 FB Neck ROM: Full    Dental  (+) Teeth Intact   Pulmonary    breath sounds clear to auscultation       Cardiovascular negative cardio ROS   Rhythm:Regular Rate:Normal     Neuro/Psych PSYCHIATRIC DISORDERS Anxiety Depression    GI/Hepatic GERD  ,  Endo/Other  negative endocrine ROS  Renal/GU CRFRenal disease  negative genitourinary   Musculoskeletal  (+) Arthritis , Rheumatoid disorders,    Abdominal   Peds negative pediatric ROS (+)  Hematology negative hematology ROS (+)   Anesthesia Other Findings   Reproductive/Obstetrics                            Lab Results  Component Value Date   WBC 7.8 05/07/2015   HGB 12.0* 05/07/2015   HCT 35.8* 05/07/2015   MCV 89.1 05/07/2015   PLT 269 05/07/2015   Lab Results  Component Value Date   CREATININE 0.67 01/09/2015   BUN 17 01/09/2015   NA 136 01/09/2015   K 3.9 01/09/2015   CL 99* 01/09/2015   CO2 28 01/09/2015   Lab Results  Component Value Date   INR 1.02 12/15/2014     Anesthesia Physical Anesthesia Plan  ASA: II  Anesthesia Plan: General   Post-op Pain Management:    Induction: Intravenous  Airway Management Planned: Oral ETT  Additional Equipment:   Intra-op Plan:   Post-operative Plan: Extubation in OR  Informed Consent: I have reviewed the patients History and Physical, chart, labs and discussed the procedure including the risks, benefits and alternatives for the proposed anesthesia with the patient or authorized representative who has indicated his/her understanding and acceptance.   Dental advisory given  Plan Discussed with: CRNA  Anesthesia Plan Comments:          Anesthesia Quick Evaluation

## 2015-05-07 NOTE — Anesthesia Procedure Notes (Signed)
Procedure Name: Intubation Date/Time: 05/07/2015 6:07 PM Performed by: Leroy Libman L Patient Re-evaluated:Patient Re-evaluated prior to inductionOxygen Delivery Method: Circle system utilized Preoxygenation: Pre-oxygenation with 100% oxygen Intubation Type: IV induction Ventilation: Mask ventilation without difficulty and Oral airway inserted - appropriate to patient size Laryngoscope Size: Miller and 3 Grade View: Grade I Tube type: Oral Tube size: 8.0 mm Number of attempts: 1 Airway Equipment and Method: Stylet Placement Confirmation: ETT inserted through vocal cords under direct vision,  breath sounds checked- equal and bilateral and positive ETCO2 Secured at: 21 cm Tube secured with: Tape Dental Injury: Teeth and Oropharynx as per pre-operative assessment

## 2015-05-07 NOTE — Progress Notes (Signed)
PHARMACY - VANCOMYCIN   Scr resulted and is 0.75 with CrCl ~ 91 ml/min  Plan:  Vancomycin 750mg  IV q8h            F/U cultures  , PharmD 05/07/15 @ 23:25

## 2015-05-08 NOTE — Progress Notes (Signed)
Subjective: 1 Day Post-Op Procedure(s) (LRB): IRRIGATION AND DEBRIDEMENT LEFT KNEE WITH POLY EXCHANGE (Left) Patient reports pain as moderate.  Reports stinging pain anterior aspect of knee otherwise no complaints.   Objective: Vital signs in last 24 hours: Temp:  [97.4 F (36.3 C)-98.9 F (37.2 C)] 97.4 F (36.3 C) (09/24 0600) Pulse Rate:  [79-103] 89 (09/24 0600) Resp:  [12-18] 16 (09/24 0600) BP: (136-169)/(73-126) 153/76 mmHg (09/24 0600) SpO2:  [93 %-100 %] 98 % (09/24 0600) Weight:  [71.668 kg (158 lb)] 71.668 kg (158 lb) (09/23 1503)  Intake/Output from previous day: 09/23 0701 - 09/24 0700 In: 2420 [P.O.:720; I.V.:1700] Out: 1410 [Urine:1275; Drains:135] Intake/Output this shift: Total I/O In: 360 [P.O.:360] Out: 600 [Urine:600]   Recent Labs  05/07/15 1523  HGB 12.0*    Recent Labs  05/07/15 1523  WBC 7.8  RBC 4.02*  HCT 35.8*  PLT 269    Recent Labs  05/07/15 2209  NA 135  K 4.3  CL 103  CO2 26  BUN 22*  CREATININE 0.75  GLUCOSE 160*  CALCIUM 8.5*   No results for input(s): LABPT, INR in the last 72 hours.  Sensation intact distally Intact pulses distally Incision: dressing C/D/I Compartment soft  Assessment/Plan: 1 Day Post-Op Procedure(s) (LRB): IRRIGATION AND DEBRIDEMENT LEFT KNEE WITH POLY EXCHANGE (Left) Up with therapy  Cultures from earlier in week negative for growth. Intra-op cultures pending.  Will leave Hemovac in place today.   Howie Rufus 05/08/2015, 8:21 AM

## 2015-05-08 NOTE — Progress Notes (Signed)
   05/08/15 1500  PT Visit Information  Last PT Received On 05/08/15  Assistance Needed +1  History of Present Illness s/p I and D L knee with poly exchange  PT Time Calculation  PT Start Time (ACUTE ONLY) 1527  PT Stop Time (ACUTE ONLY) 1553  PT Time Calculation (min) (ACUTE ONLY) 26 min  Subjective Data  Patient Stated Goal home  Precautions  Precautions Knee  Restrictions  Other Position/Activity Restrictions WBAT  Pain Assessment  Pain Assessment 0-10  Pain Score 4  Pain Location L knee  Pain Descriptors / Indicators Burning;Discomfort  Pain Intervention(s) Limited activity within patient's tolerance;Monitored during session;RN gave pain meds during session  Cognition  Arousal/Alertness Awake/alert  Behavior During Therapy [facility] for tasks assessed/performed  Overall Cognitive Status Within Functional Limits for tasks assessed  Bed Mobility  Overal bed mobility Needs Assistance  Bed Mobility Supine to Sit;Sit to Supine  Supine to sit Min assist  Sit to supine Min assist  General bed mobility comments assist with LLE  Transfers  Overall transfer level Needs assistance  Equipment used Rolling walker (2 wheeled)  Transfers Sit to/from Stand  Sit to Stand Min guard  General transfer comment cues for hand placement and LE position  Ambulation/Gait  Ambulation/Gait assistance Supervision  Ambulation Distance (Feet) 150 Feet  Assistive device Rolling walker (2 wheeled)  Gait Pattern/deviations Step-to pattern;Antalgic;Step-through pattern  General Gait Details cues for RW position, posture  Total Joint Exercises  Ankle Circles/Pumps AROM;Both;10 reps  Quad Sets AROM;Both;10 reps  Heel Slides AAROM;Left;10 reps (very gentle AAROM)  PT - End of Session  Activity Tolerance Patient tolerated treatment well  Patient left in chair;with call bell/phone within reach  Nurse Communication Mobility status  PT - Assessment/Plan  PT Plan Current plan remains appropriate  PT  Frequency (ACUTE ONLY) Min 6X/week  Follow Up Recommendations Home health PT  PT equipment None recommended by PT  PT Goal Progression  Progress towards PT goals Progressing toward goals  Acute Rehab PT Goals  PT Goal Formulation With patient  Time For Goal Achievement 05/12/15  Potential to Achieve Goals Good  PT General Charges  $$ ACUTE PT VISIT 1 Procedure  PT Treatments  $Gait Training 23-37 mins

## 2015-05-08 NOTE — Evaluation (Signed)
Physical Therapy Evaluation Patient Details Name: [name] MRN: [id number] DOB: 27-Mar-1956 Today's Date: 05/08/2015   History of Present Illness  s/p I and D L knee with poly exchange  Clinical Impression  Pt admitted with above diagnosis. Pt currently with functional limitations due to the deficits listed below (see PT Problem List).  Pt will benefit from skilled PT to increase their independence and safety with mobility to allow discharge to the venue listed below.  Will follow while in acute setting, pt should progress well     Follow Up Recommendations Home health PT    Equipment Recommendations  None recommended by PT    Recommendations for Other Services       Precautions / Restrictions Precautions Precautions: Knee Restrictions Weight Bearing Restrictions: No Other Position/Activity Restrictions: WBAT      Mobility  Bed Mobility Overal bed mobility: Needs Assistance Bed Mobility: Supine to Sit     Supine to sit: Min assist     General bed mobility comments: assist with LLE  Transfers Overall transfer level: Needs assistance Equipment used: Rolling walker (2 wheeled) Transfers: Sit to/from Stand Sit to Stand: Min guard         General transfer comment: cues for hand placement and LE position  Ambulation/Gait Ambulation/Gait assistance: Min guard Ambulation Distance (Feet): 120 Feet Assistive device: Rolling walker (2 wheeled) Gait Pattern/deviations: Step-to pattern;Step-through pattern;Antalgic     General Gait Details: cues for RW position, posture  Stairs            Wheelchair Mobility    Modified Rankin (Stroke Patients Only)       Balance                                             Pertinent Vitals/Pain Pain Assessment: 0-10 Pain Location: 2 Pain Descriptors / Indicators: Burning Pain Intervention(s): Limited activity within patient's tolerance;Monitored during session    Home Living Family/patient  expects to be discharged to:: Private residence Living Arrangements: Spouse/significant other Available Help at Discharge: Available PRN/intermittently Type of Home: House Home Access: Stairs to enter Entrance Stairs-Rails: None   Home Layout: One level Home Equipment: Crutches;Walker - 2 wheels;Bedside commode Additional Comments: wants to see if he can borrow a 3in1 before ordering one    Prior Function Level of Independence: Independent               Hand Dominance        Extremity/Trunk Assessment   Upper Extremity Assessment: Defer to OT evaluation           Lower Extremity Assessment: LLE deficits/detail   LLE Deficits / Details: ankle WLF, knee ROM and strength limited by pain     Communication   Communication: No difficulties  Cognition Arousal/Alertness: Awake/alert Behavior During Therapy: WFL for tasks assessed/performed Overall Cognitive Status: Within Functional Limits for tasks assessed                      General Comments      Exercises Total Joint Exercises Ankle Circles/Pumps: AROM;Both;5 reps Quad Sets: 5 reps;Left      Assessment/Plan    PT Assessment Patient needs continued PT services  PT Diagnosis Difficulty walking   PT Problem List Decreased activity tolerance;Decreased strength;Decreased range of motion;Decreased mobility  PT Treatment Interventions DME instruction;Gait training;Functional mobility training;Therapeutic activities;Patient/family education;Therapeutic exercise  PT Goals (Current goals can be found in the Care Plan section) Acute Rehab PT Goals Patient Stated Goal: home PT Goal Formulation: With patient Time For Goal Achievement: 05/12/15 Potential to Achieve Goals: Good    Frequency Min 6X/week   Barriers to discharge        Co-evaluation               End of Session Equipment Utilized During Treatment: Gait belt Activity Tolerance: Patient tolerated treatment well Patient left: with  call bell/phone within reach;in chair;with family/visitor present Nurse Communication: Mobility status         Time: 1203-1225 PT Time Calculation (min) (ACUTE ONLY): 22 min   Charges:   PT Evaluation $Initial PT Evaluation Tier I: 1 Procedure     PT G CodesDrucilla Chalet 05/26/2015, 2:07 PM

## 2015-05-08 NOTE — Anesthesia Postprocedure Evaluation (Signed)
  Anesthesia Post-op Note  Patient: [name]  Procedure(s) Performed: Procedure(s): IRRIGATION AND DEBRIDEMENT LEFT KNEE WITH POLY EXCHANGE (Left)  Patient Location: PACU  Anesthesia Type:General  Level of Consciousness: awake and alert   Airway and Oxygen Therapy: Patient Spontanous Breathing  Post-op Pain: minimal  Post-op Assessment: Post-op Vital signs reviewed and Patient's Cardiovascular Status Stable LLE Motor Response: Responds to commands LLE Sensation: No numbness, No tingling          Post-op Vital Signs: Reviewed and stable  Last Vitals:  Filed Vitals:   05/07/15 2315  BP: 146/73  Pulse: 96  Temp: 37 C  Resp: 16    Complications: No apparent anesthesia complications

## 2015-05-09 LAB — BASIC METABOLIC PANEL
Anion gap: 7 (ref 5–15)
BUN: 13 mg/dL (ref 6–20)
CALCIUM: 8.3 mg/dL — AB (ref 8.9–10.3)
CHLORIDE: 102 mmol/L (ref 101–111)
CO2: 30 mmol/L (ref 22–32)
CREATININE: 0.75 mg/dL (ref 0.61–1.24)
GFR calc Af Amer: >60 mL/min (ref >60)
GFR calc non Af Amer: >60 mL/min (ref >60)
Glucose, Bld: 116 mg/dL — ABNORMAL HIGH (ref 65–99)
Potassium: 3.1 mmol/L — ABNORMAL LOW (ref 3.5–5.1)
Sodium: 139 mmol/L (ref 135–145)

## 2015-05-09 LAB — CBC
HCT: 35 % — ABNORMAL LOW (ref 39.0–52.0)
Hemoglobin: 11.6 g/dL — ABNORMAL LOW (ref 13.0–17.0)
MCH: 29.7 pg (ref 26.0–34.0)
MCHC: 33.1 g/dL (ref 30.0–36.0)
MCV: 89.7 fL (ref 78.0–100.0)
PLATELETS: 314 10*3/uL (ref 150–400)
RBC: 3.9 MIL/uL — ABNORMAL LOW (ref 4.22–5.81)
RDW: 13.6 % (ref 11.5–15.5)
WBC: 8 10*3/uL (ref 4.0–10.5)

## 2015-05-09 LAB — VANCOMYCIN, TROUGH: VANCOMYCIN TR: 10 ug/mL (ref 10.0–20.0)

## 2015-05-09 MED ORDER — POLYETHYLENE GLYCOL 3350 17 G PO PACK
17.0000 g | PACK | Freq: Every day | ORAL | Status: DC | PRN
  Administered 2015-05-09: 17 g via ORAL

## 2015-05-09 MED ORDER — VANCOMYCIN HCL IN DEXTROSE 1-5 GM/200ML-% IV SOLN
1000.0000 mg | Freq: Three times a day (TID) | INTRAVENOUS | Status: DC
  Administered 2015-05-10 (×2): 1000 mg via INTRAVENOUS
  Filled 2015-05-09 (×2): qty 200

## 2015-05-09 NOTE — Progress Notes (Signed)
   05/09/15 1500  PT Visit Information  Last PT Received On 05/09/15  Assistance Needed +1  History of Present Illness s/p I and D L knee with poly exchange  PT Time Calculation  PT Start Time (ACUTE ONLY) 1509  PT Stop Time (ACUTE ONLY) 1528  PT Time Calculation (min) (ACUTE ONLY) 19 min  Subjective Data  Patient Stated Goal home  Precautions  Precautions Knee  Restrictions  Other Position/Activity Restrictions WBAT  Pain Assessment  Pain Assessment 0-10  Pain Score 3  Pain Location L knee  Pain Descriptors / Indicators Discomfort  Pain Intervention(s) Limited activity within patient's tolerance;Monitored during session;Ice applied  Cognition  Arousal/Alertness Awake/alert  Behavior During Therapy WFL for tasks assessed/performed  Overall Cognitive Status Within Functional Limits for tasks assessed  Bed Mobility  Overal bed mobility Needs Assistance  Bed Mobility Supine to Sit;Sit to Supine  Supine to sit Supervision  Sit to supine Supervision  General bed mobility comments pt self assist with LLE  Transfers  Overall transfer level Needs assistance  Equipment used Rolling Zela Sobieski (2 wheeled)  Transfers Sit to/from Stand  Sit to Stand Supervision  General transfer comment cues for hand placement and LE position  Ambulation/Gait  Ambulation/Gait assistance Supervision  Ambulation Distance (Feet) 120 Feet  Assistive device Rolling Brookelynn Hamor (2 wheeled)  Gait Pattern/deviations Step-to pattern;Step-through pattern  General Gait Details cues for RW position, posture  Total Joint Exercises  Heel Slides AAROM;Left;15 reps (pt instructed in use of sheet to self assist)  PT - End of Session  Activity Tolerance Patient tolerated treatment well  Patient left in chair;with call bell/phone within reach;with family/visitor present  Nurse Communication Mobility status  PT - Assessment/Plan  PT Plan Current plan remains appropriate  PT Frequency (ACUTE ONLY) Min 6X/week  Follow Up  Recommendations Home health PT  PT equipment None recommended by PT  PT Goal Progression  Progress towards PT goals Progressing toward goals  Acute Rehab PT Goals  PT Goal Formulation With patient  Time For Goal Achievement 05/12/15  Potential to Achieve Goals Good  PT General Charges  $$ ACUTE PT VISIT 1 Procedure  PT Treatments  $Gait Training 8-22 mins

## 2015-05-09 NOTE — Progress Notes (Addendum)
ANTIBIOTIC CONSULT NOTE - Follow Up  Pharmacy Consult for Vancomycin Indication: Infected left total knee arthroplasty  Allergies  Allergen Reactions  . Penicillins Rash    Has patient had a PCN reaction causing immediate rash, facial/tongue/throat swelling, SOB or lightheadedness with hypotension: Yes Has patient had a PCN reaction causing severe rash involving mucus membranes or skin necrosis: No- around scars.  Has patient had a PCN reaction that required hospitalization No Has patient had a PCN reaction occurring within the last 10 years: No If all of the above answers are "NO", then may proceed with Cephalosporin use.     Patient Measurements: Height: 5' 7"  (170.2 cm) Weight: 158 lb (71.668 kg) IBW/kg (Calculated) : 66.1  Vital Signs: Temp: 98.2 F (36.8 C) (09/25 0500) Temp Source: Oral (09/25 0500) BP: 143/79 mmHg (09/25 0500) Pulse Rate: 81 (09/25 0500) Intake/Output from previous day: 09/24 0701 - 09/25 0700 In: 1365 [P.O.:840; I.V.:225; IV Piggyback:300] Out: 1710 [Urine:1650; Drains:60] Intake/Output from this shift: Total I/O In: 240 [P.O.:240] Out: 250 [Urine:250]  Labs:  Recent Labs  05/07/15 1523 05/07/15 2209 05/09/15 0500  WBC 7.8  --  8.0  HGB 12.0*  --  11.6*  PLT 269  --  314  CREATININE  --  0.75 0.75   Estimated Creatinine Clearance: 91.8 mL/min (by C-G formula based on Cr of 0.75). No results for input(s): VANCOTROUGH, VANCOPEAK, VANCORANDOM, GENTTROUGH, GENTPEAK, GENTRANDOM, TOBRATROUGH, TOBRAPEAK, TOBRARND, AMIKACINPEAK, AMIKACINTROU, AMIKACIN in the last 72 hours.   Microbiology: Recent Results (from the past 720 hour(s))  Anaerobic culture     Status: None (Preliminary result)   Collection Time: 05/07/15  6:41 PM  Result Value Ref Range Status   Specimen Description SYNOVIAL  Final   Special Requests NONE  Final   Gram Stain PENDING  Incomplete   Culture   Final    NO ANAEROBES ISOLATED; CULTURE IN PROGRESS FOR 5 DAYS Performed  at [facility]    Report Status PENDING  Incomplete  Wound culture     Status: None (Preliminary result)   Collection Time: 05/07/15  7:05 PM  Result Value Ref Range Status   Specimen Description WOUND SYNOVIAL FLUID, LEFT KNEE  Final   Special Requests NONE  Final   Gram Stain PENDING  Incomplete   Culture   Final    NO GROWTH 1 DAY Performed at [facility]    Report Status PENDING  Incomplete  Culture, blood (single)     Status: None (Preliminary result)   Collection Time: 05/07/15  8:25 PM  Result Value Ref Range Status   Specimen Description   Final    BLOOD RIGHT ARM Performed at [place] Requests   Final    BOTTLES DRAWN AEROBIC AND ANAEROBIC 10ML Performed at [place]   Final    NO GROWTH 1 DAY Performed at [facility]    Report Status PENDING  Incomplete   Assessment: 18 yoM with rheumatoid disease and s/p left TKR in May 2016 presents with painful left knee and effusion.  Knee aspiration shows elevated WBCs and ESR/CRP also elevated worrisome for infection. Pt underwent I/D, synovectomy, and poly liner exchange of left knee on 9/23 and pharmacy consulted to start vancomycin post op.  Pt also on acyclovir for possible viral infection. First dose vancomycin 1g given pre-op.   Anti-infectives 9/23 >> Vancomycin  >>  Vitals/Labs WBC: WNL Tm24h: Afebrile Renal: SCr  0.75, CrCl 92 ml/min  Cultures 9/23 Blood cx x1: ngtd 9/23 Wound cx: ngtd 9/23 Anaerobic wound cx: ngtd  Levels/dose changes: 9/25 VT at 17:30 = ____ before 7th total dose  Goal of Therapy:  Vancomycin trough level 15-20 mcg/ml  Eradication of infection  Plan:   Continue vancomycin 740m IV q8h  Check vancomycin trough tonight at 17:30 prior to 7th total dose  EPeggyann Juba PharmD, BCPS  05/09/2015, 1:37 PM  Pager: [phone]  Addendum: Trough = 10 mcg/ml which is < goal  Plan:  Increase  vancomycin to 1g IV q8h  Recheck trough at steady state if continues  EPeggyann Juba PharmD, BCPS 05/09/2015 7:09 PM

## 2015-05-09 NOTE — Progress Notes (Signed)
Subjective: 2 Days Post-Op Procedure(s) (LRB): IRRIGATION AND DEBRIDEMENT LEFT KNEE WITH POLY EXCHANGE (Left) Patient reports pain as moderate.  No complaints otherwise. Objective: Vital signs in last 24 hours: Temp:  [98.1 F (36.7 C)-98.3 F (36.8 C)] 98.2 F (36.8 C) (09/25 0500) Pulse Rate:  [79-81] 81 (09/25 0500) Resp:  [16] 16 (09/25 0500) BP: (143-158)/(77-80) 143/79 mmHg (09/25 0500) SpO2:  [96 %-98 %] 98 % (09/25 0500)  Intake/Output from previous day: 09/24 0701 - 09/25 0700 In: 1365 [P.O.:840; I.V.:225; IV Piggyback:300] Out: 1710 [Urine:1650; Drains:60] Intake/Output this shift:     Recent Labs  05/07/15 1523 05/09/15 0500  HGB 12.0* 11.6*    Recent Labs  05/07/15 1523 05/09/15 0500  WBC 7.8 8.0  RBC 4.02* 3.90*  HCT 35.8* 35.0*  PLT 269 314    Recent Labs  05/07/15 2209 05/09/15 0500  NA 135 139  K 4.3 3.1*  CL 103 102  CO2 26 30  BUN 22* 13  CREATININE 0.75 0.75  GLUCOSE 160* 116*  CALCIUM 8.5* 8.3*   No results for input(s): LABPT, INR in the last 72 hours.  Left leg incision benign Incision: no drainage Compartment soft  Assessment/Plan: 2 Days Post-Op Procedure(s) (LRB): IRRIGATION AND DEBRIDEMENT LEFT KNEE WITH POLY EXCHANGE (Left) Up with therapy  Awaiting cultures  Probable discharge tomorrow.  Hemovac pulled this AM and dressing changed  CLARK, GILBERT 05/09/2015, 9:02 AM

## 2015-05-09 NOTE — Progress Notes (Signed)
Physical Therapy Treatment Patient Details Name: [name] MRN: [id number] DOB: Aug 25, 1955 Today's Date: 05-17-15    History of Present Illness s/p I and D L knee with poly exchange    PT Comments    Pt progressing well; took a shower this am, hemovac drerssing saturated, pt informed RN  but dressing changed by PT; worked on gentle flexion/ROM and pt tolerated well  Follow Up Recommendations  Home health PT     Equipment Recommendations  None recommended by PT    Recommendations for Other Services       Precautions / Restrictions Precautions Precautions: Knee Restrictions Other Position/Activity Restrictions: WBAT    Mobility  Bed Mobility Overal bed mobility: Needs Assistance Bed Mobility: Supine to Sit     Supine to sit: Supervision     General bed mobility comments: pt self assist with LLE  Transfers Overall transfer level: Needs assistance Equipment used: Rolling walker (2 wheeled) Transfers: Sit to/from Stand Sit to Stand: Min guard         General transfer comment: cues for hand placement and LE position  Ambulation/Gait Ambulation/Gait assistance: Supervision Ambulation Distance (Feet): 170 Feet Assistive device: Rolling walker (2 wheeled) Gait Pattern/deviations: Step-to pattern;Step-through pattern     General Gait Details: cues for RW position, posture   Stairs            Wheelchair Mobility    Modified Rankin (Stroke Patients Only)       Balance                                    Cognition Arousal/Alertness: Awake/alert Behavior During Therapy: WFL for tasks assessed/performed Overall Cognitive Status: Within Functional Limits for tasks assessed                      Exercises Total Joint Exercises Ankle Circles/Pumps: AROM;Both;10 reps Quad Sets: AROM;Both;10 reps Heel Slides: AAROM;Left;10 reps (prolonged passive flexion stretch over roll)    General Comments        Pertinent  Vitals/Pain Pain Assessment: 0-10 Pain Score: 1  Pain Location: L knee Pain Descriptors / Indicators: Burning;Sore Pain Intervention(s): Limited activity within patient's tolerance;Monitored during session;Premedicated before session;Repositioned;Ice applied    Home Living                      Prior Function            PT Goals (current goals can now be found in the care plan section) Acute Rehab PT Goals Patient Stated Goal: home PT Goal Formulation: With patient Time For Goal Achievement: 05/12/15 Potential to Achieve Goals: Good Progress towards PT goals: Progressing toward goals    Frequency  Min 6X/week    PT Plan Current plan remains appropriate    Co-evaluation             End of Session Equipment Utilized During Treatment: Gait belt Activity Tolerance: Patient tolerated treatment well Patient left: in chair;with call bell/phone within reach     Time: 1021-1059 PT Time Calculation (min) (ACUTE ONLY): 38 min  Charges:  $Gait Training: 8-22 mins $Therapeutic Exercise: 8-22 mins                    G Codes:      WILLIAMS,TARA 17-May-2015, 10:59 AM

## 2015-05-10 ENCOUNTER — Encounter (HOSPITAL_COMMUNITY): Payer: Self-pay | Admitting: Orthopaedic Surgery

## 2015-05-10 LAB — WOUND CULTURE: Culture: NO GROWTH

## 2015-05-10 MED ORDER — MINOCYCLINE HCL 100 MG PO CAPS: 100.0000 mg | ORAL_CAPSULE | Freq: Two times a day (BID) | ORAL | Status: DC

## 2015-05-10 MED ORDER — DOXYCYCLINE HYCLATE 50 MG PO CAPS: 50.0000 mg | ORAL_CAPSULE | Freq: Two times a day (BID) | ORAL | Status: DC

## 2015-05-10 MED ORDER — OXYCODONE-ACETAMINOPHEN 5-325 MG PO TABS: 1.0000 | ORAL_TABLET | ORAL | Status: DC | PRN

## 2015-05-10 MED ORDER — DOXYCYCLINE HYCLATE 50 MG PO CAPS: 100.0000 mg | ORAL_CAPSULE | Freq: Two times a day (BID) | ORAL | Status: DC

## 2015-05-10 NOTE — Progress Notes (Signed)
Pt to d/c home with [facility]. No DME needs. AVS reviewed and "My Chart" discussed with pt. Pt capable of verbalizing medications,  signs and symptoms of infection, and follow-up appointments. Remains hemodynamically stable. No signs and symptoms of distress. Educated pt to return to ER in the case of SOB, dizziness, or chest pain.

## 2015-05-10 NOTE — Care Management Important Message (Signed)
Important Message  Patient Details  Name: [name] MRN: [id number] Date of Birth: [birth year]   Medicare Important Message Given:  Yes-second notification given    Haskell Flirt 05/10/2015, 12:44 PMImportant Message  Patient Details  Name: [name] MRN: [id number] Date of Birth: 1955/09/24   Medicare Important Message Given:  Yes-second notification given    Haskell Flirt 05/10/2015, 12:43 PM

## 2015-05-10 NOTE — Progress Notes (Signed)
Patient ID: [name], male   DOB: 1956/02/07, 59 y.o.   MRN: [id number] No acute changes.  Awaiting culture results before final disposition.

## 2015-05-10 NOTE — Progress Notes (Signed)
Physical Therapy Treatment Patient Details Name: [name] MRN: [id number] DOB: 11-Oct-1955 Today's Date: 06/07/15    History of Present Illness s/p I and D L knee with poly exchange    PT Comments    Pt continues to have very limited ROM L knee, worked on ROM this session and discussed progression/goals for ROM prior to MD f/u; pt is doing quite well with mobility but limited with ROM; he is frustrated with this process;  Will benefit from HHPT.  Follow Up Recommendations  Home health PT     Equipment Recommendations  None recommended by PT    Recommendations for Other Services       Precautions / Restrictions Precautions Precautions: Knee Restrictions Weight Bearing Restrictions: No Other Position/Activity Restrictions: WBAT    Mobility  Bed Mobility                  Transfers                    Ambulation/Gait                 Stairs            Wheelchair Mobility    Modified Rankin (Stroke Patients Only)       Balance                                    Cognition Arousal/Alertness: Awake/alert Behavior During Therapy: WFL for tasks assessed/performed Overall Cognitive Status: Within Functional Limits for tasks assessed                      Exercises Total Joint Exercises Long Arc Quad: AAROM;Left;5 reps Knee Flexion: AAROM;AROM;Left;10 reps Goniometric ROM: ~  -12 to 60* flexion AAROM in sitting position    General Comments        Pertinent Vitals/Pain Pain Assessment: 0-10 Pain Score: 4  Pain Location: L knee with flexion Pain Descriptors / Indicators: Grimacing;Guarding Pain Intervention(s): Limited activity within patient's tolerance;Monitored during session;Premedicated before session    Home Living                      Prior Function            PT Goals (current goals can now be found in the care plan section) Acute Rehab PT Goals Patient Stated Goal: home PT Goal  Formulation: With patient Time For Goal Achievement: 05/12/15 Potential to Achieve Goals: Good Progress towards PT goals: Progressing toward goals    Frequency  Min 6X/week    PT Plan Current plan remains appropriate    Co-evaluation             End of Session   Activity Tolerance: Patient tolerated treatment well Patient left: in chair;with call bell/phone within reach     Time: 1238-1249 PT Time Calculation (min) (ACUTE ONLY): 11 min  Charges:  $Therapeutic Exercise: 8-22 mins                    G Codes:      WILLIAMS,TARA 06/07/15, 1:17 PM

## 2015-05-10 NOTE — Discharge Summary (Signed)
Patient ID: [name] MRN: [id number] DOB/AGE: 12-12-1955 59 y.o.  Admit date: 05/07/2015 Discharge date: 05/10/2015  Admission Diagnoses:  Principal Problem:   Effusion of left knee joint; questionable prosthetic joint infection Active Problems:   Knee effusion   Discharge Diagnoses:  Same  Past Medical History  Diagnosis Date  . Depression   . OCD (obsessive compulsive disorder)   . GERD (gastroesophageal reflux disease)   . Anxiety   . History of kidney stones     x1 15 years ago  . Restless leg syndrome   . Cataract     left eye  . Chronic kidney disease     Kidney stone once  . Headache     hx of migraines-none recent  . Rheumatoid arthritis(714.0)     sees Dr Sharmon Revere @ [facility] in [place]    Surgeries: Procedure(s): IRRIGATION AND DEBRIDEMENT LEFT KNEE WITH POLY EXCHANGE on 05/07/2015   Consultants:  PT   Discharged Condition: Improved  Hospital Course: Richad Ramsay is an 59 y.o. male who was admitted 05/07/2015 for operative treatment ofEffusion of left knee joint. Patient has severe unremitting pain that affects sleep, daily activities, and work/hobbies. After pre-op clearance the patient was taken to the operating room on 05/07/2015 and underwent  Procedure(s): IRRIGATION AND DEBRIDEMENT LEFT KNEE WITH POLY EXCHANGE.    Patient was given perioperative antibiotics: Anti-infectives    Start     Dose/Rate Route Frequency Ordered Stop   05/10/15 0200  vancomycin (VANCOCIN) IVPB 1000 mg/200 mL premix     1,000 mg 200 mL/hr over 60 Minutes Intravenous Every 8 hours 05/09/15 1910     05/10/15 0000  minocycline (MINOCIN,DYNACIN) 100 MG capsule     100 mg Oral 2 times daily 05/10/15 1122     05/08/15 0300  vancomycin (VANCOCIN) IVPB 750 mg/150 ml premix  Status:  Discontinued     750 mg 150 mL/hr over 60 Minutes Intravenous Every 8 hours 05/07/15 2324 05/09/15 1910   05/07/15 2200  acyclovir (ZOVIRAX) tablet 800 mg     800 mg Oral 3 times daily 05/07/15  2127     05/07/15 1730  vancomycin (VANCOCIN) IVPB 1000 mg/200 mL premix     1,000 mg 200 mL/hr over 60 Minutes Intravenous  Once 05/07/15 1715 05/07/15 1928       Patient was given sequential compression devices, early ambulation, and chemoprophylaxis to prevent DVT.  Patient benefited maximally from hospital stay and there were no complications.    Recent vital signs: Patient Vitals for the past 24 hrs:  BP Temp Temp src Pulse Resp SpO2  05/10/15 0430 128/77 mmHg 97.6 F (36.4 C) Oral 69 16 99 %  05/09/15 2055 110/62 mmHg 98.2 F (36.8 C) Oral 80 16 98 %  05/09/15 1400 (!) 156/129 mmHg 97.9 F (36.6 C) Oral 87 16 -     Recent laboratory studies:  Recent Labs  05/07/15 1523  05/07/15 2209 05/09/15 0500  WBC 7.8  --   --  8.0  HGB 12.0*  --   --  11.6*  HCT 35.8*  --   --  35.0*  PLT 269  --   --  314  NA  --   --  135 139  K  --   --  4.3 3.1*  CL  --   --  103 102  CO2  --   --  26 30  BUN  --   --  22* 13  CREATININE  --   --  0.75 0.75  GLUCOSE  --   --  160* 116*  CALCIUM  --   < > 8.5* 8.3*  < > = values in this interval not displayed.   Discharge Medications:     Medication List    STOP taking these medications        predniSONE 20 MG tablet  Commonly known as:  DELTASONE     rivaroxaban 10 MG Tabs tablet  Commonly known as:  XARELTO     traMADol 50 MG tablet  Commonly known as:  ULTRAM      TAKE these medications        acyclovir 400 MG tablet  Commonly known as:  ZOVIRAX  Take 2 tablets by mouth 3 (three) times daily.     cyclobenzaprine 10 MG tablet  Commonly known as:  FLEXERIL  Take 1 tablet (10 mg total) by mouth 3 (three) times daily as needed for muscle spasms.     DULoxetine 60 MG capsule  Commonly known as:  CYMBALTA  Take 60 mg by mouth every morning.     folic acid 1 MG tablet  Commonly known as:  FOLVITE  Take 1 mg by mouth at bedtime.     ibuprofen 200 MG tablet  Commonly known as:  ADVIL,MOTRIN  Take 800 mg by mouth  every 6 (six) hours as needed for moderate pain.     ketotifen 0.025 % ophthalmic solution  Commonly known as:  ZADITOR  Place 1 drop into both eyes 2 (two) times daily as needed (Allergies).     leflunomide 10 MG tablet  Commonly known as:  ARAVA  Take 10 mg by mouth at bedtime.     methotrexate 2.5 MG tablet  Commonly known as:  RHEUMATREX  Take 15 mg by mouth every 7 (seven) days. Sunday     methylphenidate 20 MG tablet  Commonly known as:  RITALIN  Take 10 mg by mouth 2 (two) times daily as needed.     minocycline 100 MG capsule  Commonly known as:  MINOCIN,DYNACIN  Take 1 capsule (100 mg total) by mouth 2 (two) times daily.     omeprazole 40 MG capsule  Commonly known as:  PRILOSEC  Take 40 mg by mouth 2 (two) times daily.     oxyCODONE-acetaminophen 5-325 MG per tablet  Commonly known as:  ROXICET  Take 1-2 tablets by mouth every 4 (four) hours as needed.     pramipexole 0.25 MG tablet  Commonly known as:  MIRAPEX  Take 0.25 mg by mouth 2 (two) times daily.     ropinirole 5 MG tablet  Commonly known as:  REQUIP  Take 5 mg by mouth 3 (three) times daily.     tamsulosin 0.4 MG Caps capsule  Commonly known as:  FLOMAX  Take 0.4 mg by mouth 2 (two) times daily.        Diagnostic Studies: No results found.  Disposition:Home       Discharge Instructions    Weight bearing as tolerated    Complete by:  As directed   Laterality:  left  Extremity:  Lower           Follow-up Information    Follow up with Kathryne Hitch, MD In 2 weeks.   Specialty:  Orthopedic Surgery   Contact information:   [address] [place] [address] [phone]        Signed: Richardean Canal 05/10/2015, 11:24 AM

## 2015-05-10 NOTE — Progress Notes (Signed)
Patient ID: [name], male   DOB: 09-30-1955, 59 y.o.   MRN: [id number] Cultures from office aspiration left knee without any growth. Will discharge patient to home on doxycycline for one month.

## 2015-05-10 NOTE — Discharge Instructions (Signed)

## 2015-05-13 LAB — CULTURE, BLOOD (SINGLE): CULTURE: NO GROWTH

## 2015-05-13 LAB — ANAEROBIC CULTURE

## 2015-06-21 ENCOUNTER — Other Ambulatory Visit (HOSPITAL_COMMUNITY): Payer: Self-pay | Admitting: Orthopaedic Surgery

## 2015-06-21 ENCOUNTER — Encounter (HOSPITAL_COMMUNITY): Payer: Self-pay | Admitting: *Deleted

## 2015-06-21 NOTE — Progress Notes (Signed)
Spoke with Herbert Seta at [facility] office requesting orders.

## 2015-06-22 ENCOUNTER — Encounter (HOSPITAL_COMMUNITY): Payer: Self-pay | Admitting: General Practice

## 2015-06-22 ENCOUNTER — Encounter (HOSPITAL_COMMUNITY)
Admission: RE | Disposition: A | Discharge status: Home / Self Care | Payer: Self-pay | Source: Ambulatory Visit | Attending: Orthopaedic Surgery

## 2015-06-22 ENCOUNTER — Ambulatory Visit (HOSPITAL_COMMUNITY)
Admission: RE | Admit: 2015-06-22 | Discharge: 2015-06-22 | Disposition: A | Discharge status: Home / Self Care | Payer: Medicare HMO | Source: Ambulatory Visit | Attending: Orthopaedic Surgery | Admitting: Orthopaedic Surgery

## 2015-06-22 ENCOUNTER — Ambulatory Visit (HOSPITAL_COMMUNITY): Payer: Medicare HMO | Admitting: Anesthesiology

## 2015-06-22 DIAGNOSIS — Z96653 Presence of artificial knee joint, bilateral: Secondary | ICD-10-CM | POA: Diagnosis not present

## 2015-06-22 DIAGNOSIS — M65862 Other synovitis and tenosynovitis, left lower leg: Secondary | ICD-10-CM | POA: Insufficient documentation

## 2015-06-22 DIAGNOSIS — F329 Major depressive disorder, single episode, unspecified: Secondary | ICD-10-CM | POA: Diagnosis not present

## 2015-06-22 DIAGNOSIS — Z880 Allergy status to penicillin: Secondary | ICD-10-CM | POA: Diagnosis not present

## 2015-06-22 DIAGNOSIS — Z96641 Presence of right artificial hip joint: Secondary | ICD-10-CM | POA: Insufficient documentation

## 2015-06-22 DIAGNOSIS — K219 Gastro-esophageal reflux disease without esophagitis: Secondary | ICD-10-CM | POA: Insufficient documentation

## 2015-06-22 DIAGNOSIS — Z96652 Presence of left artificial knee joint: Secondary | ICD-10-CM

## 2015-06-22 DIAGNOSIS — M069 Rheumatoid arthritis, unspecified: Secondary | ICD-10-CM | POA: Diagnosis not present

## 2015-06-22 DIAGNOSIS — G2581 Restless legs syndrome: Secondary | ICD-10-CM | POA: Diagnosis not present

## 2015-06-22 DIAGNOSIS — M25462 Effusion, left knee: Secondary | ICD-10-CM | POA: Diagnosis present

## 2015-06-22 HISTORY — PX: KNEE ARTHROSCOPY: SHX127

## 2015-06-22 HISTORY — DX: Other specified behavioral and emotional disorders with onset usually occurring in childhood and adolescence: F98.8

## 2015-06-22 HISTORY — DX: Benign prostatic hyperplasia without lower urinary tract symptoms: N40.0

## 2015-06-22 SURGERY — ARTHROSCOPY, KNEE
Anesthesia: General | Site: Knee | Laterality: Left

## 2015-06-22 MED ORDER — ONDANSETRON HCL 4 MG/2ML IJ SOLN
INTRAMUSCULAR | Status: DC | PRN
  Administered 2015-06-22: 4 mg via INTRAVENOUS

## 2015-06-22 MED ORDER — LIDOCAINE HCL (CARDIAC) 20 MG/ML IV SOLN
INTRAVENOUS | Status: AC
  Filled 2015-06-22: qty 5

## 2015-06-22 MED ORDER — PROPOFOL 10 MG/ML IV BOLUS
INTRAVENOUS | Status: DC | PRN
  Administered 2015-06-22: 200 mg via INTRAVENOUS

## 2015-06-22 MED ORDER — CLINDAMYCIN PHOSPHATE 900 MG/50ML IV SOLN
INTRAVENOUS | Status: AC
  Filled 2015-06-22: qty 50

## 2015-06-22 MED ORDER — LACTATED RINGERS IV SOLN
INTRAVENOUS | Status: DC | PRN
  Administered 2015-06-22: 16:00:00 via INTRAVENOUS

## 2015-06-22 MED ORDER — MORPHINE SULFATE (PF) 4 MG/ML IV SOLN
INTRAVENOUS | Status: AC
  Filled 2015-06-22: qty 1

## 2015-06-22 MED ORDER — PHENYLEPHRINE 40 MCG/ML (10ML) SYRINGE FOR IV PUSH (FOR BLOOD PRESSURE SUPPORT)
PREFILLED_SYRINGE | INTRAVENOUS | Status: AC
  Filled 2015-06-22: qty 10

## 2015-06-22 MED ORDER — SODIUM CHLORIDE 0.9 % IR SOLN
Status: DC | PRN
  Administered 2015-06-22: 12000 mL

## 2015-06-22 MED ORDER — BUPIVACAINE HCL (PF) 0.25 % IJ SOLN
INTRAMUSCULAR | Status: DC | PRN
  Administered 2015-06-22: 30 mL

## 2015-06-22 MED ORDER — MIDAZOLAM HCL 2 MG/2ML IJ SOLN
INTRAMUSCULAR | Status: AC
  Filled 2015-06-22: qty 2

## 2015-06-22 MED ORDER — LIDOCAINE HCL (CARDIAC) 20 MG/ML IV SOLN
INTRAVENOUS | Status: DC | PRN
  Administered 2015-06-22: 50 mg via INTRAVENOUS

## 2015-06-22 MED ORDER — CLINDAMYCIN PHOSPHATE 900 MG/50ML IV SOLN
900.0000 mg | INTRAVENOUS | Status: AC
  Administered 2015-06-22: 900 mg via INTRAVENOUS

## 2015-06-22 MED ORDER — FENTANYL CITRATE (PF) 250 MCG/5ML IJ SOLN
INTRAMUSCULAR | Status: AC
  Filled 2015-06-22: qty 5

## 2015-06-22 MED ORDER — ONDANSETRON HCL 4 MG/2ML IJ SOLN
INTRAMUSCULAR | Status: AC
  Filled 2015-06-22: qty 2

## 2015-06-22 MED ORDER — MIDAZOLAM HCL 5 MG/5ML IJ SOLN
INTRAMUSCULAR | Status: DC | PRN
  Administered 2015-06-22: 1 mg via INTRAVENOUS

## 2015-06-22 MED ORDER — LACTATED RINGERS IV SOLN
INTRAVENOUS | Status: DC
  Administered 2015-06-22: 14:00:00 via INTRAVENOUS

## 2015-06-22 MED ORDER — BUPIVACAINE HCL (PF) 0.25 % IJ SOLN
INTRAMUSCULAR | Status: AC
  Filled 2015-06-22: qty 30

## 2015-06-22 MED ORDER — MORPHINE SULFATE (PF) 4 MG/ML IV SOLN
INTRAVENOUS | Status: DC | PRN
  Administered 2015-06-22: 4 mg via INTRAVENOUS

## 2015-06-22 MED ORDER — FENTANYL CITRATE (PF) 100 MCG/2ML IJ SOLN
INTRAMUSCULAR | Status: DC | PRN
  Administered 2015-06-22 (×2): 25 ug via INTRAVENOUS
  Administered 2015-06-22: 50 ug via INTRAVENOUS

## 2015-06-22 SURGICAL SUPPLY — 36 items
BANDAGE ELASTIC 6 VELCRO ST LF (GAUZE/BANDAGES/DRESSINGS) ×3 IMPLANT
BLADE CUTTER GATOR 3.5 (BLADE) ×3 IMPLANT
BLADE SURG ROTATE 9660 (MISCELLANEOUS) IMPLANT
COVER SURGICAL LIGHT HANDLE (MISCELLANEOUS) ×2 IMPLANT
DRAPE ARTHROSCOPY W/POUCH 114 (DRAPES) ×3 IMPLANT
DRAPE U-SHAPE 47X51 STRL (DRAPES) ×3 IMPLANT
DRSG PAD ABDOMINAL 8X10 ST (GAUZE/BANDAGES/DRESSINGS) IMPLANT
DURAPREP 26ML APPLICATOR (WOUND CARE) ×3 IMPLANT
GAUZE SPONGE 4X4 12PLY STRL (GAUZE/BANDAGES/DRESSINGS) ×3 IMPLANT
GAUZE XEROFORM 1X8 LF (GAUZE/BANDAGES/DRESSINGS) ×3 IMPLANT
GLOVE BIOGEL PI IND STRL 8 (GLOVE) ×2 IMPLANT
GLOVE BIOGEL PI INDICATOR 8 (GLOVE) ×4
GLOVE ORTHO TXT STRL SZ7.5 (GLOVE) ×3 IMPLANT
GLOVE SURG ORTHO 8.0 STRL STRW (GLOVE) ×3 IMPLANT
GOWN STRL REUS W/ TWL LRG LVL3 (GOWN DISPOSABLE) ×2 IMPLANT
GOWN STRL REUS W/ TWL XL LVL3 (GOWN DISPOSABLE) ×4 IMPLANT
GOWN STRL REUS W/TWL LRG LVL3 (GOWN DISPOSABLE)
GOWN STRL REUS W/TWL XL LVL3 (GOWN DISPOSABLE) ×9
IV NS IRRIG 3000ML ARTHROMATIC (IV SOLUTION) ×8 IMPLANT
KIT ROOM TURNOVER OR (KITS) ×3 IMPLANT
MANIFOLD NEPTUNE II (INSTRUMENTS) ×3 IMPLANT
PACK ARTHROSCOPY DSU (CUSTOM PROCEDURE TRAY) ×3 IMPLANT
PAD ABD 8X10 STRL (GAUZE/BANDAGES/DRESSINGS) ×4 IMPLANT
PAD ARMBOARD 7.5X6 YLW CONV (MISCELLANEOUS) ×6 IMPLANT
PADDING CAST COTTON 6X4 STRL (CAST SUPPLIES) ×3 IMPLANT
SET ARTHROSCOPY TUBING (MISCELLANEOUS) ×3
SET ARTHROSCOPY TUBING LN (MISCELLANEOUS) ×1 IMPLANT
SPONGE GAUZE 4X4 12PLY STER LF (GAUZE/BANDAGES/DRESSINGS) ×2 IMPLANT
SPONGE LAP 4X18 X RAY DECT (DISPOSABLE) ×1 IMPLANT
SUT ETHILON 3 0 PS 1 (SUTURE) ×3 IMPLANT
TOWEL OR 17X24 6PK STRL BLUE (TOWEL DISPOSABLE) ×3 IMPLANT
TOWEL OR 17X26 10 PK STRL BLUE (TOWEL DISPOSABLE) ×3 IMPLANT
TUBE CONNECTING 12'X1/4 (SUCTIONS) ×1
TUBE CONNECTING 12X1/4 (SUCTIONS) ×1 IMPLANT
WAND HAND CNTRL MULTIVAC 90 (MISCELLANEOUS) IMPLANT
WATER STERILE IRR 1000ML POUR (IV SOLUTION) ×3 IMPLANT

## 2015-06-22 NOTE — Anesthesia Preprocedure Evaluation (Addendum)
Anesthesia Evaluation  Patient identified by MRN, date of birth, ID band Patient awake    Reviewed: Allergy & Precautions, NPO status , Patient's Chart, lab work & pertinent test results  History of Anesthesia Complications Negative for: history of anesthetic complications  Airway Mallampati: II  TM Distance: >3 FB Neck ROM: Full    Dental no notable dental hx. (+) Dental Advisory Given   Pulmonary neg pulmonary ROS,    Pulmonary exam normal breath sounds clear to auscultation       Cardiovascular negative cardio ROS Normal cardiovascular exam Rhythm:Regular Rate:Normal     Neuro/Psych  Headaches, PSYCHIATRIC DISORDERS Anxiety Depression    GI/Hepatic Neg liver ROS, GERD  Medicated and Controlled,  Endo/Other  negative endocrine ROS  Renal/GU negative Renal ROS  negative genitourinary   Musculoskeletal  (+) Arthritis ,   Abdominal   Peds negative pediatric ROS (+)  Hematology negative hematology ROS (+)   Anesthesia Other Findings L eye reddness prior  Reproductive/Obstetrics negative OB ROS                            Anesthesia Physical Anesthesia Plan  ASA: II  Anesthesia Plan: General   Post-op Pain Management:    Induction: Intravenous  Airway Management Planned: LMA  Additional Equipment:   Intra-op Plan:   Post-operative Plan: Extubation in OR  Informed Consent: I have reviewed the patients History and Physical, chart, labs and discussed the procedure including the risks, benefits and alternatives for the proposed anesthesia with the patient or authorized representative who has indicated his/her understanding and acceptance.   Dental advisory given  Plan Discussed with: CRNA  Anesthesia Plan Comments:         Anesthesia Quick Evaluation

## 2015-06-22 NOTE — Transfer of Care (Signed)
Immediate Anesthesia Transfer of Care Note  Patient: [name]  Procedure(s) Performed: Procedure(s): LEFT KNEE ARTHROSCOPY WITH DEBRIDEMENT, SYNOVECTOMY (Left)  Patient Location: PACU  Anesthesia Type:General  Level of Consciousness: awake and alert   Airway & Oxygen Therapy: Patient Spontanous Breathing and Patient connected to nasal cannula oxygen  Post-op Assessment: Report given to RN and Post -op Vital signs reviewed and stable  Post vital signs: Reviewed and stable  Last Vitals:  Filed Vitals:   06/22/15 1709  BP:   Pulse:   Temp: 36.6 C  Resp:     Complications: No apparent anesthesia complications

## 2015-06-22 NOTE — Brief Op Note (Signed)
06/22/2015  4:59 PM  PATIENT:  [name]  59 y.o. male  PRE-OPERATIVE DIAGNOSIS:  Recurrent synovitis left knee  POST-OPERATIVE DIAGNOSIS:  Recurrent synovitis left knee  PROCEDURE:  Procedure(s): LEFT KNEE ARTHROSCOPY WITH DEBRIDEMENT, SYNOVECTOMY (Left)  SURGEON:  Surgeon(s) and Role:    * Kathryne Hitch, MD - Primary  PHYSICIAN ASSISTANT: Rexene Edison, PA-C  ANESTHESIA:   local and general  EBL:   minimal  BLOOD ADMINISTERED:none  DRAINS: none   LOCAL MEDICATIONS USED:  MARCAINE     SPECIMEN:  No Specimen  DISPOSITION OF SPECIMEN:  N/A  COUNTS:  YES  TOURNIQUET:    DICTATION: .Other Dictation: Dictation Number [id number]  PLAN OF CARE: Discharge to home after PACU  PATIENT DISPOSITION:  PACU - hemodynamically stable.   Delay start of Pharmacological VTE agent (>24hrs) due to surgical blood loss or risk of bleeding: no

## 2015-06-22 NOTE — Discharge Instructions (Signed)
Ice and elevation of your left knee for swelling. You can put full weight on your left knee and increase your activities as comfort allows. You can remove your dressings and get your incisions wet daily in the shower in 24 hours. Place band-aides daily over your incisions after you shower.

## 2015-06-22 NOTE — H&P (Signed)
[name] is an 59 y.o. male.   Chief Complaint:   Painful recurrent effusion left knee HPI:   59 yo male with the history of a left total knee replacement in May of this year.  He does have rheumatoid disease and is on immunosupressive medications.  He was doing well until September of this year when he developed acute swelling in this knee after a viral infection of his mouth.  A left knee aspiration showed no organisms and the gram stain and culture where negative.  He was taken to the OR due to a cell count of 12,000 in that knee.  Cultures from the OR were also negative and an open synovectomy and poly liner exchange was performed.  He has developed a recurrent effusion once again in that knee and multiple cultures were sent once again.  The gram stain showed no organisms, but one culture grew out rare coagulase negative, sensitive staph.  He presents today for an arthroscopic assessment of his left knee.  Past Medical History  Diagnosis Date  . OCD (obsessive compulsive disorder)   . Anxiety   . History of kidney stones     x1 15 years ago  . Restless leg syndrome     takes requip daily  . Cataract     left eye  . Headache     hx of migraines-none recent  . Rheumatoid arthritis(714.0)     sees Dr Sharmon Revere @ [facility] in [place]  . Depression     takes Cymbalta daily  . ADD (attention deficit disorder)     takes Ritalin daily  . GERD (gastroesophageal reflux disease)     takes Omeprazole daily  . Enlarged prostate     takes Flomax daily     Past Surgical History  Procedure Laterality Date  . Total hip arthroplasty Right 01/2012    done in [facility]  . Hand tendon surgery Right 06/2012    done @ [facility] in [place]  . Rotator cuff repair Right 2008     done in [facility]  . Anterior lumbar fusion  09/13/2012    Procedure: ANTERIOR LUMBAR FUSION 1 LEVEL;  Surgeon: Reinaldo Meeker, MD;  Location: [facility];  Service: Neurosurgery;  Laterality: N/A;  lumbar four-five  .  Anterior lat lumbar fusion  09/13/2012    Procedure: ANTERIOR LATERAL LUMBAR FUSION 2 LEVELS;  Surgeon: Reinaldo Meeker, MD;  Location: [facility];  Service: Neurosurgery;  Laterality: Right;  lumbar two three,three-four  . Lumbar percutaneous pedicle screw 2 level  09/13/2012    Procedure: LUMBAR PERCUTANEOUS PEDICLE SCREW 2 LEVEL;  Surgeon: Reinaldo Meeker, MD;  Location: [facility];  Service: Neurosurgery;  Laterality: Left;  left pedicle screws two -three,three-four  . Abdominal exposure  09/13/2012    Procedure: ABDOMINAL EXPOSURE;  Surgeon: Larina Earthly, MD;  Location: [facility];  Service: Vascular;  Laterality: N/A;  . Total knee arthroplasty Left 01/08/2015    Procedure: LEFT TOTAL KNEE ARTHROPLASTY;  Surgeon: Kathryne Hitch, MD;  Location: [facility];  Service: Orthopedics;  Laterality: Left;  . I&d knee with poly exchange Left 05/07/2015    Procedure: IRRIGATION AND DEBRIDEMENT LEFT KNEE WITH POLY EXCHANGE;  Surgeon: Kathryne Hitch, MD;  Location: [facility];  Service: Orthopedics;  Laterality: Left;    History reviewed. No pertinent family history. Social History:  reports that he has never smoked. He has never used smokeless tobacco. He reports that he drinks alcohol.  He reports that he does not use illicit drugs.  Allergies:  Allergies  Allergen Reactions  . Penicillins Rash    Has patient had a PCN reaction causing immediate rash, facial/tongue/throat swelling, SOB or lightheadedness with hypotension: Yes Has patient had a PCN reaction causing severe rash involving mucus membranes or skin necrosis: No- around scars.  Has patient had a PCN reaction that required hospitalization No Has patient had a PCN reaction occurring within the last 10 years: No If all of the above answers are "NO", then may proceed with Cephalosporin use.     No prescriptions prior to admission    No results found for this or any previous visit (from the past 48 hour(s)). No results  found.  Review of Systems  All other systems reviewed and are negative.   Height 5\' 7"  (1.702 m), weight 71.668 kg (158 lb). Physical Exam  Constitutional: He is oriented to person, place, and time. He appears well-developed and well-nourished.  HENT:  Head: Normocephalic and atraumatic.  Eyes: EOM are normal. Pupils are equal, round, and reactive to light.  Neck: Normal range of motion. Neck supple.  Cardiovascular: Normal rate and regular rhythm.   Respiratory: Effort normal and breath sounds normal.  GI: Soft. Bowel sounds are normal.  Musculoskeletal:       Left knee: He exhibits decreased range of motion, swelling and effusion. Tenderness found.  Neurological: He is alert and oriented to person, place, and time.  Skin: Skin is warm and dry.  Psychiatric: He has a normal mood and affect.     Assessment/Plan Recurrent painful effusion with synovitis in a rheumatoid patient post a left total knee replacement 1)  To the OR today for a left knee arthroscopy with synovectomy  Eliska Hamil Y 06/22/2015, 12:20 PM

## 2015-06-22 NOTE — Anesthesia Postprocedure Evaluation (Signed)
Anesthesia Post Note  Patient: [name]  Procedure(s) Performed: Procedure(s) (LRB): LEFT KNEE ARTHROSCOPY WITH DEBRIDEMENT, SYNOVECTOMY (Left)  Anesthesia type: General  Patient location: PACU  Post pain: Pain level controlled and Adequate analgesia  Post assessment: Post-op Vital signs reviewed, Patient's Cardiovascular Status Stable, Respiratory Function Stable, Patent Airway and Pain level controlled  Last Vitals:  Filed Vitals:   06/22/15 1724  BP: 128/80  Pulse: 90  Temp:   Resp: 14    Post vital signs: Reviewed and stable  Level of consciousness: awake, alert  and oriented  Complications: No apparent anesthesia complications

## 2015-06-23 ENCOUNTER — Encounter (HOSPITAL_COMMUNITY): Payer: Self-pay | Admitting: Orthopaedic Surgery

## 2015-06-23 NOTE — Op Note (Signed)
NAMESAXON, BARICH             ACCOUNT NO.:  [id number]  MEDICAL RECORD NO.:  [id number]  LOCATION:  [facility]                         FACILITY:  [facility]  PHYSICIAN:  Vanita Panda. Magnus Ivan, M.D.DATE OF BIRTH:  [birth year]  DATE OF PROCEDURE:  06/22/2015 DATE OF DISCHARGE:  06/22/2015                              OPERATIVE REPORT   PREOPERATIVE DIAGNOSIS:  Recurrent synovitis and effusion, status post left total knee arthroplasty worrisome for infection.  POSTOPERATIVE DIAGNOSIS:  Recurrent synovitis and effusion, status post left total knee arthroplasty worrisome for infection.  PROCEDURE:  Left knee arthroscopy with synovectomy and extensive debridement.  SURGEON:  Vanita Panda. Magnus Ivan, M.D.  ASSISTANT:  Richardean Canal, PA-C.  ANESTHESIA:  General.  BLOOD LOSS:  Minimal.  COMPLICATIONS:  None.  INDICATIONS:  [name] is a 59 year old gentleman well known to me.  He has severe rheumatoid disease and underwent a successful right total knee arthroplasty in May of this year.  He had no problems postoperatively, but in September, developed acute left knee swelling of the total knee, a week after, having some type of what we felt that as a viral infection involving his mouth.  We saw him in the office and withdrew fluid off the knee and was concerning for an infection and had 12,000 white cells, but grew no organisms.  We still decided to take him to the operating room for an open arthrotomy, we got more cultures then, which were negative and never grew out anything.  We performed a complete open synovectomy then and a polyliner exchange.  He had then been doing well postoperatively, but developed a swelling in his knee again, withdrew fluid off that again in the office and it showed no organisms on first cultures.  One culture did show a rare Coagulase-negative staph infection that was sensitive and we were not sure if this was contaminant or not.  He is presenting now for an  arthroscopic irrigation and debridement of his knee with synovectomy.  He wishes to have this done as opposed to any other significant operation to see if we can clear whatever inflammatory process is going on in his knee versus again an infectious process.  PROCEDURE DESCRIPTION:  After informed consent was obtained, appropriate left knee was marked.  He was brought to the operating room and placed supine on the operating table.  General anesthesia was then obtained. His left knee was prepped and draped with DuraPrep and sterile drapes including a sterile stockinette with the bed raised and a lateral leg post utilized.  The left operative knee was flexed off the side of the table.  Time-out was called to identify the correct patient and correct left knee.  I then made an anterolateral arthroscopy portal, inserted a cannula in the knee and drained significant fluid from his knee that had a significant amount of sediment and just like the fluid withdrew of his knee in September, it appeared to be infectious in nature.  We then made anterior and medial arthroscopy portal as well as superolateral portal. Through all 3 portals, we placed an arthroscopic shaver to perform a debridement of the both medial and lateral gutters and the notch area of the knee where the  polyethylene is and then all throughout the suprapatellar pouch.  We also used a soft tissue ablation wand to ablate any of the soft tissue all around the lining of the knee.  Once we had done this, we had ran several liters of normal saline solution through the knee under high pressure to thoroughly irrigate out the knee.  We then closed the each portal site with interrupted nylon suture. Xeroform and well-padded sterile dressing were applied.  He was awakened, extubated and taken to the recovery room in stable condition. All final counts were correct.  There were no complications noted.     Vanita Panda. Magnus Ivan,  M.D.     CYB/MEDQ  D:  06/22/2015  T:  06/23/2015  Job:  [id number]

## 2015-08-04 ENCOUNTER — Encounter (HOSPITAL_COMMUNITY)
Admission: RE | Admit: 2015-08-04 | Discharge: 2015-08-04 | Disposition: A | Discharge status: Home / Self Care | Payer: Medicare HMO | Source: Ambulatory Visit | Attending: Orthopaedic Surgery | Admitting: Orthopaedic Surgery

## 2015-08-04 ENCOUNTER — Other Ambulatory Visit (HOSPITAL_COMMUNITY): Payer: Self-pay | Admitting: Orthopaedic Surgery

## 2015-08-04 ENCOUNTER — Encounter (HOSPITAL_COMMUNITY): Payer: Self-pay

## 2015-08-04 HISTORY — DX: Presence of spectacles and contact lenses: Z97.3

## 2015-08-04 HISTORY — DX: Anesthesia of skin: R20.2

## 2015-08-04 HISTORY — DX: Anesthesia of skin: R20.0

## 2015-08-04 HISTORY — DX: Personal history of other medical treatment: Z92.89

## 2015-08-04 LAB — BASIC METABOLIC PANEL
ANION GAP: 7 (ref 5–15)
BUN: 26 mg/dL — ABNORMAL HIGH (ref 6–20)
CALCIUM: 8.9 mg/dL (ref 8.9–10.3)
CO2: 28 mmol/L (ref 22–32)
Chloride: 105 mmol/L (ref 101–111)
Creatinine, Ser: 0.7 mg/dL (ref 0.61–1.24)
GLUCOSE: 107 mg/dL — AB (ref 65–99)
POTASSIUM: 4.2 mmol/L (ref 3.5–5.1)
Sodium: 140 mmol/L (ref 135–145)

## 2015-08-04 LAB — CBC
HEMATOCRIT: 35.2 % — AB (ref 39.0–52.0)
HEMOGLOBIN: 11.6 g/dL — AB (ref 13.0–17.0)
MCH: 29.9 pg (ref 26.0–34.0)
MCHC: 33 g/dL (ref 30.0–36.0)
MCV: 90.7 fL (ref 78.0–100.0)
Platelets: 261 10*3/uL (ref 150–400)
RBC: 3.88 MIL/uL — AB (ref 4.22–5.81)
RDW: 14.5 % (ref 11.5–15.5)
WBC: 6.2 10*3/uL (ref 4.0–10.5)

## 2015-08-04 NOTE — Progress Notes (Signed)
BMP results in epic per PAT visit 08/04/2015 sent to Dr Maureen Ralphs

## 2015-08-04 NOTE — Patient Instructions (Signed)
Saliou Barnier  08/04/2015   Your procedure is scheduled on: Friday August 06, 2015   Report to [facility] Main  Entrance take [place]  elevators to 3rd floor to  Short Stay Center at 3:00 PM.  Call this number if you have problems the morning of surgery [phone]   Remember: ONLY 1 PERSON MAY GO WITH YOU TO SHORT STAY TO GET  READY MORNING OF YOUR SURGERY.  Do not eat food After Midnight but may take clear liquids till 11:00 am day of surgery then nothing by mouth.      Take these medicines the morning of surgery with A SIP OF WATER: Mirapex; Duloxetine  (Cymbalta); Omeprazole (Prilosec); Tamsulosin (Flomax) Oxycodone if needed                                You may not have any metal on your body including hair pins and              piercings  Do not wear jewelry,  lotions, powders or colognes, deodorant                           Men may shave face and neck.   Do not bring valuables to the hospital. [facility] IS NOT             RESPONSIBLE   FOR VALUABLES.  Contacts, dentures or bridgework may not be worn into surgery.  Leave suitcase in the car. After surgery it may be brought to your room. _____________________________________________________________________             [facility] - Preparing for Surgery Before surgery, you can play an important role.  Because skin is not sterile, your skin needs to be as free of germs as possible.  You can reduce the number of germs on your skin by washing with CHG (chlorahexidine gluconate) soap before surgery.  CHG is an antiseptic cleaner which kills germs and bonds with the skin to continue killing germs even after washing. Please DO NOT use if you have an allergy to CHG or antibacterial soaps.  If your skin becomes reddened/irritated stop using the CHG and inform your nurse when you arrive at Short Stay. Do not shave (including legs and underarms) for at least 48 hours prior to the first CHG shower.  You may  shave your face/neck. Please follow these instructions carefully:  1.  Shower with CHG Soap the night before surgery and the  morning of Surgery.  2.  If you choose to wash your hair, wash your hair first as usual with your  normal  shampoo.  3.  After you shampoo, rinse your hair and body thoroughly to remove the  shampoo.                           4.  Use CHG as you would any other liquid soap.  You can apply chg directly  to the skin and wash                       Gently with a scrungie or clean washcloth.  5.  Apply the CHG Soap to your body ONLY FROM THE NECK DOWN.   Do not use on face/  open                           Wound or open sores. Avoid contact with eyes, ears mouth and genitals (private parts).                       Wash face,  Genitals (private parts) with your normal soap.             6.  Wash thoroughly, paying special attention to the area where your surgery  will be performed.  7.  Thoroughly rinse your body with warm water from the neck down.  8.  DO NOT shower/wash with your normal soap after using and rinsing off  the CHG Soap.                9.  Pat yourself dry with a clean towel.            10.  Wear clean pajamas.            11.  Place clean sheets on your bed the night of your first shower and do not  sleep with pets. Day of Surgery : Do not apply any lotions/deodorants the morning of surgery.  Please wear clean clothes to the hospital/surgery center.  FAILURE TO FOLLOW THESE INSTRUCTIONS MAY RESULT IN THE CANCELLATION OF YOUR SURGERY PATIENT SIGNATURE_________________________________  NURSE SIGNATURE__________________________________  ________________________________________________________________________    CLEAR LIQUID DIET   Foods Allowed                                                                     Foods Excluded  Coffee and tea, regular and decaf                             liquids that you cannot  Plain Jell-O in any flavor                                              see through such as: Fruit ices (not with fruit pulp)                                     milk, soups, orange juice  Iced Popsicles                                    All solid food Carbonated beverages, regular and diet                                    Cranberry, grape and apple juices Sports drinks like Gatorade Lightly seasoned clear broth or consume(fat free) Sugar, honey syrup  Sample Menu Breakfast  Lunch                                     Supper Cranberry juice                    Beef broth                            Chicken broth Jell-O                                     Grape juice                           Apple juice Coffee or tea                        Jell-O                                      Popsicle                                                Coffee or tea                        Coffee or tea  _____________________________________________________________________

## 2015-08-04 NOTE — Progress Notes (Signed)
EKG per chart per 04/23/2015

## 2015-08-04 NOTE — Progress Notes (Signed)
No orders in epic for surgical procedure. Please place orders in. Thanks.

## 2015-08-06 ENCOUNTER — Encounter (HOSPITAL_COMMUNITY): Payer: Self-pay | Admitting: *Deleted

## 2015-08-06 ENCOUNTER — Inpatient Hospital Stay (HOSPITAL_COMMUNITY): Payer: Medicare HMO | Admitting: Anesthesiology

## 2015-08-06 ENCOUNTER — Inpatient Hospital Stay (HOSPITAL_COMMUNITY)
Admission: RE | Admit: 2015-08-06 | Discharge: 2015-08-09 | DRG: 488 | Disposition: A | Discharge status: Home Health | Payer: Medicare HMO | Source: Ambulatory Visit | Attending: Orthopaedic Surgery | Admitting: Orthopaedic Surgery

## 2015-08-06 ENCOUNTER — Encounter (HOSPITAL_COMMUNITY)
Admission: RE | Disposition: A | Discharge status: Home Health | Payer: Self-pay | Source: Ambulatory Visit | Attending: Orthopaedic Surgery

## 2015-08-06 DIAGNOSIS — M25462 Effusion, left knee: Secondary | ICD-10-CM | POA: Diagnosis present

## 2015-08-06 DIAGNOSIS — T8454XA Infection and inflammatory reaction due to internal left knee prosthesis, initial encounter: Secondary | ICD-10-CM

## 2015-08-06 DIAGNOSIS — Z01812 Encounter for preprocedural laboratory examination: Secondary | ICD-10-CM | POA: Diagnosis not present

## 2015-08-06 DIAGNOSIS — Z79899 Other long term (current) drug therapy: Secondary | ICD-10-CM | POA: Diagnosis not present

## 2015-08-06 DIAGNOSIS — G2581 Restless legs syndrome: Secondary | ICD-10-CM | POA: Diagnosis present

## 2015-08-06 DIAGNOSIS — F329 Major depressive disorder, single episode, unspecified: Secondary | ICD-10-CM | POA: Diagnosis present

## 2015-08-06 DIAGNOSIS — D62 Acute posthemorrhagic anemia: Secondary | ICD-10-CM | POA: Diagnosis not present

## 2015-08-06 DIAGNOSIS — K219 Gastro-esophageal reflux disease without esophagitis: Secondary | ICD-10-CM | POA: Diagnosis present

## 2015-08-06 DIAGNOSIS — T8454XD Infection and inflammatory reaction due to internal left knee prosthesis, subsequent encounter: Secondary | ICD-10-CM

## 2015-08-06 DIAGNOSIS — T8484XA Pain due to internal orthopedic prosthetic devices, implants and grafts, initial encounter: Secondary | ICD-10-CM | POA: Diagnosis present

## 2015-08-06 DIAGNOSIS — M659 Synovitis and tenosynovitis, unspecified: Secondary | ICD-10-CM | POA: Diagnosis present

## 2015-08-06 DIAGNOSIS — M069 Rheumatoid arthritis, unspecified: Secondary | ICD-10-CM | POA: Diagnosis present

## 2015-08-06 DIAGNOSIS — N400 Enlarged prostate without lower urinary tract symptoms: Secondary | ICD-10-CM | POA: Diagnosis present

## 2015-08-06 HISTORY — PX: EXCISIONAL TOTAL KNEE ARTHROPLASTY WITH ANTIBIOTIC SPACERS: SHX5827

## 2015-08-06 SURGERY — REMOVAL, TOTAL ARTHROPLASTY HARDWARE, KNEE, WITH ANTIBIOTIC SPACER INSERTION
Anesthesia: General | Site: Knee | Laterality: Left

## 2015-08-06 MED ORDER — DULOXETINE HCL 60 MG PO CPEP
60.0000 mg | ORAL_CAPSULE | Freq: Every morning | ORAL | Status: DC
  Administered 2015-08-07 – 2015-08-09 (×3): 60 mg via ORAL
  Filled 2015-08-06 (×3): qty 1

## 2015-08-06 MED ORDER — HYDROMORPHONE HCL 1 MG/ML IJ SOLN
0.2500 mg | INTRAMUSCULAR | Status: DC | PRN
  Administered 2015-08-06 (×4): 0.5 mg via INTRAVENOUS

## 2015-08-06 MED ORDER — NEOSTIGMINE METHYLSULFATE 10 MG/10ML IV SOLN
INTRAVENOUS | Status: AC
  Filled 2015-08-06: qty 1

## 2015-08-06 MED ORDER — ROCURONIUM BROMIDE 100 MG/10ML IV SOLN
INTRAVENOUS | Status: AC
  Filled 2015-08-06: qty 2

## 2015-08-06 MED ORDER — EPHEDRINE SULFATE 50 MG/ML IJ SOLN
INTRAMUSCULAR | Status: AC
  Filled 2015-08-06: qty 2

## 2015-08-06 MED ORDER — ONDANSETRON HCL 4 MG PO TABS: 4.0000 mg | ORAL_TABLET | Freq: Four times a day (QID) | ORAL | Status: DC | PRN

## 2015-08-06 MED ORDER — VANCOMYCIN HCL 1000 MG IV SOLR
INTRAVENOUS | Status: AC
  Filled 2015-08-06: qty 4000

## 2015-08-06 MED ORDER — ONDANSETRON HCL 4 MG/2ML IJ SOLN
INTRAMUSCULAR | Status: AC
  Filled 2015-08-06: qty 2

## 2015-08-06 MED ORDER — FENTANYL CITRATE (PF) 100 MCG/2ML IJ SOLN
INTRAMUSCULAR | Status: AC
  Filled 2015-08-06: qty 2

## 2015-08-06 MED ORDER — FOLIC ACID 1 MG PO TABS
1.0000 mg | ORAL_TABLET | Freq: Every day | ORAL | Status: DC
  Administered 2015-08-07 – 2015-08-08 (×2): 1 mg via ORAL
  Filled 2015-08-06 (×3): qty 1

## 2015-08-06 MED ORDER — RIVAROXABAN 10 MG PO TABS
10.0000 mg | ORAL_TABLET | Freq: Every day | ORAL | Status: DC
  Administered 2015-08-07 – 2015-08-09 (×3): 10 mg via ORAL
  Filled 2015-08-06 (×5): qty 1

## 2015-08-06 MED ORDER — HYDROMORPHONE HCL 2 MG/ML IJ SOLN
INTRAMUSCULAR | Status: AC
  Filled 2015-08-06: qty 1

## 2015-08-06 MED ORDER — PHENOL 1.4 % MT LIQD: 1.0000 | OROMUCOSAL | Status: DC | PRN

## 2015-08-06 MED ORDER — HYDROMORPHONE HCL 1 MG/ML IJ SOLN
0.2500 mg | INTRAMUSCULAR | Status: DC | PRN
  Administered 2015-08-06 (×2): 0.5 mg via INTRAVENOUS

## 2015-08-06 MED ORDER — PROPOFOL 10 MG/ML IV BOLUS
INTRAVENOUS | Status: DC | PRN
  Administered 2015-08-06: 200 mg via INTRAVENOUS

## 2015-08-06 MED ORDER — METOCLOPRAMIDE HCL 5 MG/ML IJ SOLN: 5.0000 mg | Freq: Three times a day (TID) | INTRAMUSCULAR | Status: DC | PRN

## 2015-08-06 MED ORDER — SODIUM CHLORIDE 0.9 % IR SOLN
Status: DC | PRN
  Administered 2015-08-06: 7000 mL

## 2015-08-06 MED ORDER — DOCUSATE SODIUM 100 MG PO CAPS
100.0000 mg | ORAL_CAPSULE | Freq: Two times a day (BID) | ORAL | Status: DC
  Administered 2015-08-07 – 2015-08-09 (×5): 100 mg via ORAL

## 2015-08-06 MED ORDER — DIPHENHYDRAMINE HCL 12.5 MG/5ML PO ELIX: 12.5000 mg | ORAL_SOLUTION | ORAL | Status: DC | PRN

## 2015-08-06 MED ORDER — TOBRAMYCIN SULFATE 1.2 G IJ SOLR
INTRAMUSCULAR | Status: AC
  Filled 2015-08-06: qty 4.8

## 2015-08-06 MED ORDER — ACETAMINOPHEN 325 MG PO TABS
650.0000 mg | ORAL_TABLET | Freq: Four times a day (QID) | ORAL | Status: DC | PRN
  Administered 2015-08-09 (×2): 650 mg via ORAL
  Filled 2015-08-06 (×2): qty 2

## 2015-08-06 MED ORDER — PHENYLEPHRINE 40 MCG/ML (10ML) SYRINGE FOR IV PUSH (FOR BLOOD PRESSURE SUPPORT)
PREFILLED_SYRINGE | INTRAVENOUS | Status: AC
  Filled 2015-08-06: qty 10

## 2015-08-06 MED ORDER — ACETAMINOPHEN 650 MG RE SUPP: 650.0000 mg | Freq: Four times a day (QID) | RECTAL | Status: DC | PRN

## 2015-08-06 MED ORDER — OXYCODONE HCL 5 MG PO TABS
5.0000 mg | ORAL_TABLET | ORAL | Status: DC | PRN
  Administered 2015-08-06: 5 mg via ORAL
  Administered 2015-08-07 – 2015-08-09 (×14): 15 mg via ORAL
  Administered 2015-08-09: 10 mg via ORAL
  Administered 2015-08-09: 5 mg via ORAL
  Filled 2015-08-06 (×4): qty 3
  Filled 2015-08-06: qty 1
  Filled 2015-08-06: qty 3
  Filled 2015-08-06: qty 1
  Filled 2015-08-06 (×2): qty 3
  Filled 2015-08-06: qty 2
  Filled 2015-08-06 (×7): qty 3

## 2015-08-06 MED ORDER — VANCOMYCIN HCL 1000 MG IV SOLR
INTRAVENOUS | Status: DC | PRN
  Administered 2015-08-06: 4 g

## 2015-08-06 MED ORDER — FENTANYL CITRATE (PF) 100 MCG/2ML IJ SOLN
INTRAMUSCULAR | Status: DC | PRN
  Administered 2015-08-06: 50 ug via INTRAVENOUS
  Administered 2015-08-06 (×2): 100 ug via INTRAVENOUS

## 2015-08-06 MED ORDER — FENTANYL CITRATE (PF) 100 MCG/2ML IJ SOLN
25.0000 ug | INTRAMUSCULAR | Status: AC | PRN
  Administered 2015-08-06 (×6): 25 ug via INTRAVENOUS

## 2015-08-06 MED ORDER — SODIUM CHLORIDE 0.9 % IV SOLN
INTRAVENOUS | Status: DC
  Administered 2015-08-07: 01:00:00 via INTRAVENOUS

## 2015-08-06 MED ORDER — GLYCOPYRROLATE 0.2 MG/ML IJ SOLN
INTRAMUSCULAR | Status: AC
  Filled 2015-08-06: qty 3

## 2015-08-06 MED ORDER — ZOLPIDEM TARTRATE 5 MG PO TABS: 5.0000 mg | ORAL_TABLET | Freq: Every evening | ORAL | Status: DC | PRN

## 2015-08-06 MED ORDER — METHOCARBAMOL 1000 MG/10ML IJ SOLN
500.0000 mg | Freq: Four times a day (QID) | INTRAVENOUS | Status: DC | PRN
  Administered 2015-08-06: 500 mg via INTRAVENOUS
  Filled 2015-08-06 (×2): qty 5

## 2015-08-06 MED ORDER — GLYCOPYRROLATE 0.2 MG/ML IJ SOLN
INTRAMUSCULAR | Status: DC | PRN
  Administered 2015-08-06: .6 mg via INTRAVENOUS

## 2015-08-06 MED ORDER — LACTATED RINGERS IV SOLN: INTRAVENOUS | Status: DC

## 2015-08-06 MED ORDER — LIDOCAINE HCL (CARDIAC) 20 MG/ML IV SOLN
INTRAVENOUS | Status: DC | PRN
  Administered 2015-08-06: 50 mg via INTRAVENOUS

## 2015-08-06 MED ORDER — POLYETHYLENE GLYCOL 3350 17 G PO PACK: 17.0000 g | PACK | Freq: Every day | ORAL | Status: DC | PRN

## 2015-08-06 MED ORDER — HYDROMORPHONE HCL 1 MG/ML IJ SOLN
INTRAMUSCULAR | Status: DC | PRN
  Administered 2015-08-06 (×3): 0.5 mg via INTRAVENOUS
  Administered 2015-08-06 (×2): 1 mg via INTRAVENOUS
  Administered 2015-08-06: 0.5 mg via INTRAVENOUS

## 2015-08-06 MED ORDER — PROMETHAZINE HCL 25 MG/ML IJ SOLN: 6.2500 mg | INTRAMUSCULAR | Status: DC | PRN

## 2015-08-06 MED ORDER — VANCOMYCIN HCL IN DEXTROSE 1-5 GM/200ML-% IV SOLN
INTRAVENOUS | Status: AC
  Filled 2015-08-06: qty 200

## 2015-08-06 MED ORDER — DEXAMETHASONE SODIUM PHOSPHATE 10 MG/ML IJ SOLN
INTRAMUSCULAR | Status: AC
  Filled 2015-08-06: qty 1

## 2015-08-06 MED ORDER — TOBRAMYCIN SULFATE 1.2 G IJ SOLR: INTRAMUSCULAR | Status: DC | PRN

## 2015-08-06 MED ORDER — PHENYLEPHRINE HCL 10 MG/ML IJ SOLN
INTRAMUSCULAR | Status: DC | PRN
  Administered 2015-08-06 (×2): 80 ug via INTRAVENOUS

## 2015-08-06 MED ORDER — MIDAZOLAM HCL 5 MG/5ML IJ SOLN
INTRAMUSCULAR | Status: DC | PRN
  Administered 2015-08-06: 2 mg via INTRAVENOUS

## 2015-08-06 MED ORDER — MIDAZOLAM HCL 2 MG/2ML IJ SOLN
INTRAMUSCULAR | Status: AC
  Filled 2015-08-06: qty 2

## 2015-08-06 MED ORDER — LEFLUNOMIDE 20 MG PO TABS
10.0000 mg | ORAL_TABLET | Freq: Every day | ORAL | Status: DC
  Administered 2015-08-07 – 2015-08-08 (×2): 10 mg via ORAL
  Filled 2015-08-06 (×3): qty 1

## 2015-08-06 MED ORDER — FENTANYL CITRATE (PF) 250 MCG/5ML IJ SOLN
INTRAMUSCULAR | Status: AC
  Filled 2015-08-06: qty 5

## 2015-08-06 MED ORDER — NEOSTIGMINE METHYLSULFATE 10 MG/10ML IV SOLN
INTRAVENOUS | Status: DC | PRN
  Administered 2015-08-06: 4 mg via INTRAVENOUS

## 2015-08-06 MED ORDER — VANCOMYCIN HCL IN DEXTROSE 1-5 GM/200ML-% IV SOLN
1000.0000 mg | Freq: Two times a day (BID) | INTRAVENOUS | Status: DC
  Administered 2015-08-07 – 2015-08-08 (×4): 1000 mg via INTRAVENOUS
  Filled 2015-08-06 (×5): qty 200

## 2015-08-06 MED ORDER — LACTATED RINGERS IV SOLN
INTRAVENOUS | Status: DC | PRN
  Administered 2015-08-06: 16:00:00 via INTRAVENOUS

## 2015-08-06 MED ORDER — PANTOPRAZOLE SODIUM 40 MG PO TBEC
40.0000 mg | DELAYED_RELEASE_TABLET | Freq: Every day | ORAL | Status: DC
  Administered 2015-08-07 – 2015-08-09 (×3): 40 mg via ORAL
  Filled 2015-08-06 (×3): qty 1

## 2015-08-06 MED ORDER — ONDANSETRON HCL 4 MG/2ML IJ SOLN: 4.0000 mg | Freq: Four times a day (QID) | INTRAMUSCULAR | Status: DC | PRN

## 2015-08-06 MED ORDER — METHOCARBAMOL 500 MG PO TABS
500.0000 mg | ORAL_TABLET | Freq: Four times a day (QID) | ORAL | Status: DC | PRN
  Administered 2015-08-07 – 2015-08-09 (×7): 500 mg via ORAL
  Filled 2015-08-06 (×7): qty 1

## 2015-08-06 MED ORDER — PRAMIPEXOLE DIHYDROCHLORIDE 0.25 MG PO TABS
0.2500 mg | ORAL_TABLET | Freq: Two times a day (BID) | ORAL | Status: DC
  Administered 2015-08-06 – 2015-08-09 (×6): 0.25 mg via ORAL
  Filled 2015-08-06 (×7): qty 1

## 2015-08-06 MED ORDER — ACETAMINOPHEN 10 MG/ML IV SOLN
INTRAVENOUS | Status: AC
  Filled 2015-08-06: qty 100

## 2015-08-06 MED ORDER — SODIUM CHLORIDE 0.9 % IJ SOLN
INTRAMUSCULAR | Status: AC
  Filled 2015-08-06: qty 20

## 2015-08-06 MED ORDER — PROPOFOL 10 MG/ML IV BOLUS
INTRAVENOUS | Status: AC
  Filled 2015-08-06: qty 40

## 2015-08-06 MED ORDER — HYDROMORPHONE HCL 1 MG/ML IJ SOLN
1.0000 mg | INTRAMUSCULAR | Status: DC | PRN
  Administered 2015-08-06 – 2015-08-08 (×5): 1 mg via INTRAVENOUS
  Filled 2015-08-06 (×5): qty 1

## 2015-08-06 MED ORDER — ONDANSETRON HCL 4 MG/2ML IJ SOLN
INTRAMUSCULAR | Status: DC | PRN
  Administered 2015-08-06: 4 mg via INTRAVENOUS

## 2015-08-06 MED ORDER — ACETAMINOPHEN 10 MG/ML IV SOLN
1000.0000 mg | Freq: Once | INTRAVENOUS | Status: AC
  Administered 2015-08-06: 1000 mg via INTRAVENOUS

## 2015-08-06 MED ORDER — TAMSULOSIN HCL 0.4 MG PO CAPS
0.4000 mg | ORAL_CAPSULE | Freq: Every day | ORAL | Status: DC
Start: 2015-08-07 — End: 2015-08-09
  Administered 2015-08-07 – 2015-08-09 (×3): 0.4 mg via ORAL
  Filled 2015-08-06 (×4): qty 1

## 2015-08-06 MED ORDER — LIDOCAINE HCL (CARDIAC) 20 MG/ML IV SOLN
INTRAVENOUS | Status: AC
  Filled 2015-08-06: qty 10

## 2015-08-06 MED ORDER — FENTANYL CITRATE (PF) 100 MCG/2ML IJ SOLN
25.0000 ug | INTRAMUSCULAR | Status: DC | PRN
  Administered 2015-08-06: 50 ug via INTRAVENOUS

## 2015-08-06 MED ORDER — VANCOMYCIN HCL 1000 MG IV SOLR
1000.0000 mg | INTRAVENOUS | Status: DC | PRN
  Administered 2015-08-06: 1000 mg via INTRAVENOUS

## 2015-08-06 MED ORDER — HYDROMORPHONE HCL 1 MG/ML IJ SOLN
INTRAMUSCULAR | Status: AC
Start: 2015-08-06 — End: 2015-08-07
  Filled 2015-08-06: qty 1

## 2015-08-06 MED ORDER — ADULT MULTIVITAMIN W/MINERALS CH
1.0000 | ORAL_TABLET | Freq: Every day | ORAL | Status: DC
  Administered 2015-08-08 – 2015-08-09 (×2): 1 via ORAL
  Filled 2015-08-06 (×4): qty 1

## 2015-08-06 MED ORDER — HYDROMORPHONE HCL 1 MG/ML IJ SOLN
INTRAMUSCULAR | Status: AC
  Filled 2015-08-06: qty 1

## 2015-08-06 MED ORDER — METHYLPHENIDATE HCL 10 MG PO TABS: 10.0000 mg | ORAL_TABLET | Freq: Two times a day (BID) | ORAL | Status: DC

## 2015-08-06 MED ORDER — PHENYLEPHRINE 40 MCG/ML (10ML) SYRINGE FOR IV PUSH (FOR BLOOD PRESSURE SUPPORT)
PREFILLED_SYRINGE | INTRAVENOUS | Status: AC
  Filled 2015-08-06: qty 20

## 2015-08-06 MED ORDER — METOCLOPRAMIDE HCL 10 MG PO TABS: 5.0000 mg | ORAL_TABLET | Freq: Three times a day (TID) | ORAL | Status: DC | PRN

## 2015-08-06 MED ORDER — SODIUM CHLORIDE 0.9 % IR SOLN
Status: DC | PRN
  Administered 2015-08-06: 1000 mL

## 2015-08-06 MED ORDER — ROCURONIUM BROMIDE 100 MG/10ML IV SOLN
INTRAVENOUS | Status: DC | PRN
  Administered 2015-08-06: 50 mg via INTRAVENOUS

## 2015-08-06 MED ORDER — MENTHOL 3 MG MT LOZG: 1.0000 | LOZENGE | OROMUCOSAL | Status: DC | PRN

## 2015-08-06 MED ORDER — DEXAMETHASONE SODIUM PHOSPHATE 10 MG/ML IJ SOLN
INTRAMUSCULAR | Status: DC | PRN
  Administered 2015-08-06: 10 mg via INTRAVENOUS

## 2015-08-06 SURGICAL SUPPLY — 48 items
BAG SPEC THK2 15X12 ZIP CLS (MISCELLANEOUS) ×1
BAG ZIPLOCK 12X15 (MISCELLANEOUS) ×2 IMPLANT
BANDAGE ACE 6X5 VEL STRL LF (GAUZE/BANDAGES/DRESSINGS) ×3 IMPLANT
BLADE 11 SAFETY STRL DISP (BLADE) ×2 IMPLANT
BLADE SAG 18X100X1.27 (BLADE) ×3 IMPLANT
BNDG COHESIVE 4X5 TAN STRL (GAUZE/BANDAGES/DRESSINGS) ×2 IMPLANT
BONE CEMENT GENTAMICIN (Cement) ×12 IMPLANT
BOWL SMART MIX CTS (DISPOSABLE) ×4 IMPLANT
CEMENT BONE GENTAMICIN 40 (Cement) IMPLANT
CLOTH BEACON ORANGE TIMEOUT ST (SAFETY) ×3 IMPLANT
CUFF TOURN SGL QUICK 34 (TOURNIQUET CUFF) ×3
CUFF TRNQT CYL 34X4X40X1 (TOURNIQUET CUFF) ×1 IMPLANT
DRAPE U-SHAPE 47X51 STRL (DRAPES) ×3 IMPLANT
DRSG PAD ABDOMINAL 8X10 ST (GAUZE/BANDAGES/DRESSINGS) ×3 IMPLANT
DURAPREP 26ML APPLICATOR (WOUND CARE) ×3 IMPLANT
ELECT REM PT RETURN 9FT ADLT (ELECTROSURGICAL) ×3
ELECTRODE REM PT RTRN 9FT ADLT (ELECTROSURGICAL) ×1 IMPLANT
EVACUATOR 1/8 PVC DRAIN (DRAIN) ×3 IMPLANT
GAUZE SPONGE 4X4 12PLY STRL (GAUZE/BANDAGES/DRESSINGS) ×3 IMPLANT
GAUZE XEROFORM 5X9 LF (GAUZE/BANDAGES/DRESSINGS) ×2 IMPLANT
GLOVE BIO SURGEON STRL SZ7.5 (GLOVE) ×7 IMPLANT
GLOVE BIOGEL PI IND STRL 8 (GLOVE) ×2 IMPLANT
GLOVE BIOGEL PI INDICATOR 8 (GLOVE) ×4
GLOVE ECLIPSE 8.0 STRL XLNG CF (GLOVE) ×3 IMPLANT
GLOVE SURG SS PI 8.5 STRL IVOR (GLOVE) ×4
GLOVE SURG SS PI 8.5 STRL STRW (GLOVE) IMPLANT
GOWN STRL REUS W/TWL XL LVL3 (GOWN DISPOSABLE) ×10 IMPLANT
HANDPIECE INTERPULSE COAX TIP (DISPOSABLE) ×3
IMMOBILIZER KNEE 16 (SOFTGOODS) ×2 IMPLANT
MOLD SPACER TIB KNEE 52A/PX81M (Spacer) IMPLANT
PACK TOTAL KNEE CUSTOM (KITS) ×3 IMPLANT
PADDING CAST COTTON 6X4 STRL (CAST SUPPLIES) ×4 IMPLANT
POSITIONER SURGICAL ARM (MISCELLANEOUS) ×3 IMPLANT
SET HNDPC FAN SPRY TIP SCT (DISPOSABLE) ×1 IMPLANT
SET PAD KNEE POSITIONER (MISCELLANEOUS) ×3 IMPLANT
SPACER KASM MOLD44APX67ML KNEE (Spacer) ×2 IMPLANT
SPACERMOLD TIB KNEE 52A/PX81M (Spacer) ×3 IMPLANT
SPONGE LAP 18X18 X RAY DECT (DISPOSABLE) ×2 IMPLANT
STAPLER VISISTAT 35W (STAPLE) ×2 IMPLANT
SUT ETHIBOND NAB CT1 #1 30IN (SUTURE) ×6 IMPLANT
SUT VIC AB 0 CT1 36 (SUTURE) ×5 IMPLANT
SUT VIC AB 1 CT1 36 (SUTURE) ×6 IMPLANT
SUT VIC AB 2-0 CT1 27 (SUTURE) ×6
SUT VIC AB 2-0 CT1 TAPERPNT 27 (SUTURE) ×2 IMPLANT
SWAB COLLECTION DEVICE MRSA (MISCELLANEOUS) ×2 IMPLANT
SWAB CULTURE ESWAB REG 1ML (MISCELLANEOUS) ×2 IMPLANT
WATER STERILE IRR 1500ML POUR (IV SOLUTION) ×3 IMPLANT
YANKAUER SUCT BULB TIP NO VENT (SUCTIONS) ×3 IMPLANT

## 2015-08-06 NOTE — Anesthesia Postprocedure Evaluation (Signed)
Anesthesia Post Note  Patient: [name]  Procedure(s) Performed: Procedure(s) (LRB): EXCISION OF LEFT TOTAL KNEE ARTHROPLASTY WITH PLACEMENT OF ANTIBIOTIC SPACERS (Left)  Patient location during evaluation: PACU Anesthesia Type: General Level of consciousness: awake and alert Pain management: pain level controlled Vital Signs Assessment: post-procedure vital signs reviewed and stable Respiratory status: spontaneous breathing, nonlabored ventilation, respiratory function stable and patient connected to nasal cannula oxygen Cardiovascular status: blood pressure returned to baseline and stable Postop Assessment: no signs of nausea or vomiting Anesthetic complications: no Comments: Increased pain requiring increased levels of narcotics. IV acetaminophen also given. Now able to fall asleep.    Last Vitals:  Filed Vitals:   08/06/15 2045 08/06/15 2100  BP: 167/89 154/82  Pulse: 96 84  Temp:  36.7 C  Resp: 21 12    Last Pain:  Filed Vitals:   08/06/15 2110  PainSc: 8                  Oluwatomiwa Kinyon J

## 2015-08-06 NOTE — H&P (Signed)
[name] is an 59 y.o. male.   Chief Complaint:   Left knee recurrent effusions HPI:   59 yo male with rheumatoid disease who underwent a left primary total knee arthroplasty in May of this year.  Did very well until he developed a questionable "hand/foot/mouth"  Infection.  A week after that he developed acute knee swelling and I aspirated his knee in my office and found no organisms, but 15,000 WBC in the synovial fluid.  He was taken to the OR that week for an open synovectomy, wash out and poly liner exchange.  Cultures then were negative.  He however continued to develop recurrent effusions with negative cultures and I even took him back to the OR for an arthroscopic wash out.  Still cultures have remained negative, but he was on oral doxycycline for a while due to a skin rash.  He now continues to have recurrent effusions and he has been off of oral antibiotics for 3 weeks now.  An aspiration in my office still showed no organisms, but his WBC from inside the knee is now 31,000.  I have recommended excision of his components at this point with repeat cultures followed by placement of an antibiotic spacer and 6 weeks of IV antibiotics in an attempt to hopefully clear whatever infection I highly suspect he has.  Past Medical History  Diagnosis Date  . OCD (obsessive compulsive disorder)   . Anxiety   . History of kidney stones     x1 15 years ago  . Restless leg syndrome     takes requip daily  . Cataract     left eye  . Headache     hx of migraines-none recent  . Rheumatoid arthritis(714.0)     sees Dr Sharmon Revere @ [facility] in [place]  . Depression     takes Cymbalta daily  . ADD (attention deficit disorder)     takes Ritalin daily  . GERD (gastroesophageal reflux disease)     takes Omeprazole daily  . Enlarged prostate     takes Flomax daily   . Numbness and tingling     fingers bilat comes and goes   . Wears glasses   . History of blood transfusion     Past Surgical  History  Procedure Laterality Date  . Total hip arthroplasty Right 01/2012    done in [facility]  . Hand tendon surgery Right 06/2012    done @ [facility] in [place]  . Rotator cuff repair Right 2008     done in [facility]  . Anterior lumbar fusion  09/13/2012    Procedure: ANTERIOR LUMBAR FUSION 1 LEVEL;  Surgeon: Reinaldo Meeker, MD;  Location: [facility];  Service: Neurosurgery;  Laterality: N/A;  lumbar four-five  . Anterior lat lumbar fusion  09/13/2012    Procedure: ANTERIOR LATERAL LUMBAR FUSION 2 LEVELS;  Surgeon: Reinaldo Meeker, MD;  Location: [facility];  Service: Neurosurgery;  Laterality: Right;  lumbar two three,three-four  . Lumbar percutaneous pedicle screw 2 level  09/13/2012    Procedure: LUMBAR PERCUTANEOUS PEDICLE SCREW 2 LEVEL;  Surgeon: Reinaldo Meeker, MD;  Location: [facility] OR[facility];  Service: Neurosurgery;  Laterality: Left;  left pedicle screws two -three,three-four  . Abdominal exposure  09/13/2012    Procedure: ABDOMINAL EXPOSURE;  Surgeon: Larina Earthly, MD;  Location: [facility];  Service: Vascular;  Laterality: N/A;  . Total knee arthroplasty Left 01/08/2015    Procedure: LEFT TOTAL KNEE ARTHROPLASTY;  Surgeon: Kathryne Hitch, MD;  Location: [facility] ORS;  Service: Orthopedics;  Laterality: Left;  . I&d knee with poly exchange Left 05/07/2015    Procedure: IRRIGATION AND DEBRIDEMENT LEFT KNEE WITH POLY EXCHANGE;  Surgeon: Kathryne Hitch, MD;  Location: [facility] ORS;  Service: Orthopedics;  Laterality: Left;  . Knee arthroscopy Left 06/22/2015    Procedure: LEFT KNEE ARTHROSCOPY WITH DEBRIDEMENT, SYNOVECTOMY;  Surgeon: Kathryne Hitch, MD;  Location: [facility] OR;  Service: Orthopedics;  Laterality: Left;    History reviewed. No pertinent family history. Social History:  reports that he has never smoked. He has never used smokeless tobacco. He reports that he drinks alcohol. He reports that he does not use illicit drugs.  Allergies:  Allergies  Allergen Reactions   . Penicillins Rash    Has patient had a PCN reaction causing immediate rash, facial/tongue/throat swelling, SOB or lightheadedness with hypotension: No Has patient had a PCN reaction causing severe rash involving mucus membranes or skin necrosis: No- around scars.  Has patient had a PCN reaction that required hospitalization No Has patient had a PCN reaction occurring within the last 10 years: No If all of the above answers are "NO", then may proceed with Cephalosporin use.     Medications Prior to Admission  Medication Sig Dispense Refill  . DULoxetine (CYMBALTA) 60 MG capsule Take 60 mg by mouth every morning.    . folic acid (FOLVITE) 1 MG tablet Take 1 mg by mouth at bedtime.     Marland Kitchen ibuprofen (ADVIL,MOTRIN) 200 MG tablet Take 800 mg by mouth every 6 (six) hours as needed for moderate pain.    Marland Kitchen leflunomide (ARAVA) 10 MG tablet Take 10 mg by mouth at bedtime.    . methotrexate (RHEUMATREX) 2.5 MG tablet Take 12.5 mg by mouth every 7 (seven) days. Sunday    . methylphenidate (RITALIN) 20 MG tablet Take 10 mg by mouth 2 (two) times daily with breakfast and lunch.     . Multiple Vitamin (MULTIVITAMIN WITH MINERALS) TABS tablet Take 1 tablet by mouth daily.    Marland Kitchen omeprazole (PRILOSEC) 20 MG capsule Take 20 mg by mouth 2 (two) times daily.    Marland Kitchen oxyCODONE (OXY IR/ROXICODONE) 5 MG immediate release tablet Take 5-10 mg by mouth every 6 (six) hours as needed.  0  . pramipexole (MIRAPEX) 0.25 MG tablet Take 0.25 mg by mouth 2 (two) times daily.  3  . tamsulosin (FLOMAX) 0.4 MG CAPS capsule Take 0.4 mg by mouth daily after breakfast.       No results found for this or any previous visit (from the past 48 hour(s)). No results found.  Review of Systems  Musculoskeletal: Positive for joint pain.  All other systems reviewed and are negative.   Blood pressure 144/83, pulse 101, temperature 98.1 F (36.7 C), temperature source Oral, resp. rate 18, height 5\' 7"  (1.702 m), weight 75.297 kg (166 lb),  SpO2 99 %. Physical Exam  Constitutional: He is oriented to person, place, and time. He appears well-developed and well-nourished.  HENT:  Head: Normocephalic and atraumatic.  Eyes: EOM are normal. Pupils are equal, round, and reactive to light.  Neck: Normal range of motion. Neck supple.  Cardiovascular: Normal rate and regular rhythm.   Respiratory: Effort normal and breath sounds normal.  GI: Soft. Bowel sounds are normal.  Musculoskeletal:       Left knee: He exhibits swelling and effusion. Tenderness found.  Neurological: He is alert and oriented to person, place, and  time.  Skin: Skin is warm and dry.  Psychiatric: He has a normal mood and affect.     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  Admission for PICC line placement for 6 weeks of IV antibiotics followed by a hopeful revision arthroplasty once a sterile environment is made in his left knee.  Kathryne Hitch 08/06/2015, 4:46 PM

## 2015-08-06 NOTE — Anesthesia Procedure Notes (Signed)
Procedure Name: Intubation Date/Time: 08/06/2015 5:22 PM Performed by: Findley Vi, Nuala Alpha Pre-anesthesia Checklist: Patient identified, Emergency Drugs available, Suction available, Patient being monitored and Timeout performed Patient Re-evaluated:Patient Re-evaluated prior to inductionOxygen Delivery Method: Circle system utilized Preoxygenation: Pre-oxygenation with 100% oxygen Intubation Type: IV induction Ventilation: Mask ventilation without difficulty Laryngoscope Size: Mac and 4 Grade View: Grade II Tube type: Oral Tube size: 7.5 mm Number of attempts: 1 Airway Equipment and Method: Stylet Placement Confirmation: ETT inserted through vocal cords under direct vision,  positive ETCO2,  CO2 detector and breath sounds checked- equal and bilateral Secured at: 23 cm Tube secured with: Tape Dental Injury: Teeth and Oropharynx as per pre-operative assessment

## 2015-08-06 NOTE — Anesthesia Preprocedure Evaluation (Addendum)
Anesthesia Evaluation  Patient identified by MRN, date of birth, ID band Patient awake    Reviewed: Allergy & Precautions, NPO status , Patient's Chart, lab work & pertinent test results  Airway Mallampati: II  TM Distance: >3 FB Neck ROM: Full    Dental no notable dental hx.    Pulmonary neg pulmonary ROS,    Pulmonary exam normal breath sounds clear to auscultation       Cardiovascular Exercise Tolerance: Good negative cardio ROS Normal cardiovascular exam Rhythm:Regular Rate:Normal     Neuro/Psych  Headaches, PSYCHIATRIC DISORDERS Anxiety Depression Lumbar spine XRAY 09-13-12: Findings: C-arm films document L4-5 anterior lumbar interbody fusion, with L2-3 and L3-4 XLIF. Right-sided pedicle screws have been placed from L2-L4 and connected by a single rod. The degree of asymmetric loss of interspace height at L2-3 on the left is improved compared with preoperative study    GI/Hepatic Neg liver ROS, GERD  Medicated,  Endo/Other  negative endocrine ROS  Renal/GU negative Renal ROS  negative genitourinary   Musculoskeletal  (+) Arthritis ,   Abdominal   Peds negative pediatric ROS (+)  Hematology negative hematology ROS (+)   Anesthesia Other Findings   Reproductive/Obstetrics negative OB ROS                           Anesthesia Physical Anesthesia Plan  ASA: II  Anesthesia Plan: General   Post-op Pain Management:    Induction: Intravenous  Airway Management Planned:   Additional Equipment:   Intra-op Plan:   Post-operative Plan: Extubation in OR  Informed Consent: I have reviewed the patients History and Physical, chart, labs and discussed the procedure including the risks, benefits and alternatives for the proposed anesthesia with the patient or authorized representative who has indicated his/her understanding and acceptance.   Dental advisory given  Plan Discussed  with: CRNA  Anesthesia Plan Comments:         Anesthesia Quick Evaluation

## 2015-08-06 NOTE — Op Note (Signed)
NAMEZACHARI, [name]             ACCOUNT NO.:  [id number]  MEDICAL RECORD NO.:  [id number]  LOCATION:  [facility]                         FACILITY:  [facility]  PHYSICIAN:  Vanita Panda. 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  PROCEDURE:  Excision arthroplasty of entire left total knee arthroplasty.  COMPONENTS: 1. Irrigation and debridement and synovectomy, left knee. 2. Placement of antibiotic-laden cement spacer, left knee.  FINDINGS:  Synovitis, left knee with Gram stain and cultures pending.  SURGEON:  Vanita Panda. Magnus Ivan, MD  ASSISTANT:  Richardean Canal, PA-C  ANESTHESIA:  General.  ANTIBIOTICS:  1 g IV vancomycin after cultures obtained.  BLOOD LOSS:  200 to 300 mL.  COMPLICATIONS:  None.  INDICATIONS:  [name] is a 59 year old gentleman well known to me.  He has rheumatoid disease and in May of this year underwent a primary left total knee arthroplasty to treat painful arthritic left knee.  He had actually did very well for several months and then awoke 1 day with what his primary care physician felt was hand, foot, and mouth disease.  He was started on antibiotics, but then went to an Ear, Nose, and Throat specialist, who said this is viral, and stopped the antibiotics. However the next day, he woke up with a swollen left knee, which was knee with recent total knee arthroplasty.  I saw him in the office that day and aspirated dark fluid from the knee, we sent this off for Gram stain, culture, and cell count and it showed no organisms and 15,000 white cells.  I decided at that point to take him to the operating room, which we did and performed an open synovectomy, irrigation  and debridement and polyethylene exchange.  Cultures from our office were negative and then cultures intraoperative from the day of surgery were negative for organism.  He then was started on doxycycline for rash that he had over his body, but he had had these rashes before even prior to surgery.  He did well for about another month, but then developed recurrent effusion in the knee again, so we took him to the operating room for arthroscopic synovectomy of the knee and washout.  Again cultures remained negative.  At this point, I have stopped all antibiotics and he continued to have swelling from his knee.  I drained fluid from the knee recently and it grew out 31,000 white cells and no organisms.  At this point, we continued effusions.  We felt that he may have some type of deep-seated late infection and that he has been on and off antibiotics, now that has created a potential sterile environment, but not enough to treat the infection.  At this point, we recommended excision arthroplasty, removed all the components, placed antibiotic spacer and get him on 6 weeks of IV antibiotics for a 2-staged procedure.  The risks and benefits of surgery were explained to him in detail and he does wish to proceed given what he  has been dealing with.  PROCEDURE DESCRIPTION:  After informed consent was obtained, appropriate left knee was marked.  He was brought to the operating room, he was placed supine on the operating table.  General anesthesia was then obtained.  Nonsterile tourniquet was placed around his upper left leg. The left leg was prepped and draped with DuraPrep and sterile drapes including sterile stockinette.  A time-out was called and he was identified as correct patient, correct left knee.  We then made incision directly over the patella and carried this proximally and distally.  We dissected down the knee joint and performed a medial parapatellar arthrotomy findings, moderate  effusion of the knee which we sent off for Gram stain and culture.  We then gave him antibiotics.  We then opened up the knee and performed a complete synovectomy as much as we could. We were able to remove both the tibial component and femoral component, but neither appeared loose.  We removed all cement debris from the knee as well as removing the patellar component.  We then irrigated the knee with 6 L of normal saline solution using pulsatile lavage.  We then mixed cement with gentamicin and vancomycin and placed these in molds for an articulating cement spacer for the femur and the tibia.  We placed this once things got hardened, before they hardened to keep the space within his knee and he was slightly loosened and could hyperextend some.  We then irrigated the knee again with another L of normal saline solution with the tourniquet down.  He bled quite readily from all of the scar tissue in his knee.  We placed a medium Hemovac in the arthrotomy and closed the arthrotomy with interrupted #1 Ethibond suture followed by 0 Vicryl in the deep tissue, 2-0 Vicryl in subcutaneous tissue, staples on the skin, and a well-padded dressing was applied. His knee was placed in knee immobilizer.  He was awakened, extubated, and taken to the recovery room in stable condition.  All final counts were correct.  There were no complications noted.  Postoperatively, we will be admitting him for placement of a PICC line and initiating antibiotics.     Vanita Panda. Magnus Ivan, M.D.     CYB/MEDQ  D:  08/06/2015  T:  08/06/2015  Job:  [id number]

## 2015-08-06 NOTE — Brief Op Note (Signed)
08/06/2015  7:32 PM  PATIENT:  [name]  59 y.o. male  PRE-OPERATIVE DIAGNOSIS: Questionable Left total knee infection  POST-OPERATIVE DIAGNOSIS:  same  PROCEDURE:  Procedure(s): EXCISION OF LEFT TOTAL KNEE ARTHROPLASTY WITH PLACEMENT OF ANTIBIOTIC SPACERS (Left)  SURGEON:  Surgeon(s) and Role:    * Kathryne Hitch, MD - Primary  PHYSICIAN ASSISTANT: Rexene Edison, PA-C  ANESTHESIA:   general  EBL:  Total I/O In: -  Out: 200 [Blood:200]  BLOOD ADMINISTERED:none  DRAINS: (medium) Hemovact drain(s) in the knee joint with  Suction Clamped   LOCAL MEDICATIONS USED:  NONE  SPECIMEN:  No Specimen  DISPOSITION OF SPECIMEN:  N/A  COUNTS:  YES  TOURNIQUET:   Total Tourniquet Time Documented: Thigh (Left) - 99 minutes Total: Thigh (Left) - 99 minutes   DICTATION: .Other Dictation: Dictation Number [id number]  PLAN OF CARE: Admit to inpatient   PATIENT DISPOSITION:  PACU - hemodynamically stable.   Delay start of Pharmacological VTE agent (>24hrs) due to surgical blood loss or risk of bleeding: no

## 2015-08-06 NOTE — Transfer of Care (Signed)
Immediate Anesthesia Transfer of Care Note  Patient: [name]  Procedure(s) Performed: Procedure(s): EXCISION OF LEFT TOTAL KNEE ARTHROPLASTY WITH PLACEMENT OF ANTIBIOTIC SPACERS (Left)  Patient Location: PACU  Anesthesia Type:General  Level of Consciousness:  sedated, patient cooperative and responds to stimulation  Airway & Oxygen Therapy:Patient Spontanous Breathing and Patient connected to face mask oxgen  Post-op Assessment:  Report given to PACU RN and Post -op Vital signs reviewed and stable  Post vital signs:  Reviewed and stable  Last Vitals:  Filed Vitals:   08/06/15 1500  BP: 144/83  Pulse: 101  Temp: 36.7 C  Resp: 18    Complications: No apparent anesthesia complications

## 2015-08-06 NOTE — Progress Notes (Signed)
ANTIBIOTIC CONSULT NOTE - INITIAL  Pharmacy Consult for vancomycin Indication: infected total knee replacement  Allergies  Allergen Reactions  . Penicillins Rash    Has patient had a PCN reaction causing immediate rash, facial/tongue/throat swelling, SOB or lightheadedness with hypotension: No Has patient had a PCN reaction causing severe rash involving mucus membranes or skin necrosis: No- around scars.  Has patient had a PCN reaction that required hospitalization No Has patient had a PCN reaction occurring within the last 10 years: No If all of the above answers are "NO", then may proceed with Cephalosporin use.     Patient Measurements: Height: 5\' 7"  (170.2 cm) Weight: 166 lb (75.297 kg) (as per PST on 08/04/15) IBW/kg (Calculated) : 66.1   Vital Signs: Temp: 98.1 F (36.7 C) (12/23 2125) Temp Source: Oral (12/23 1500) BP: 151/92 mmHg (12/23 2125) Pulse Rate: 98 (12/23 2125) Intake/Output from previous day:   Intake/Output from this shift: Total I/O In: 750 [I.V.:500; IV Piggyback:250] Out: 290 [Drains:90; Blood:200]  Labs:  Recent Labs  08/04/15 1130  WBC 6.2  HGB 11.6*  PLT 261  CREATININE 0.70   Estimated Creatinine Clearance: 91.8 mL/min (by C-G formula based on Cr of 0.7). No results for input(s): VANCOTROUGH, VANCOPEAK, VANCORANDOM, GENTTROUGH, GENTPEAK, GENTRANDOM, TOBRATROUGH, TOBRAPEAK, TOBRARND, AMIKACINPEAK, AMIKACINTROU, AMIKACIN in the last 72 hours.   Microbiology: No results found for this or any previous visit (from the past 720 hour(s)).  Medical History: Past Medical History  Diagnosis Date  . OCD (obsessive compulsive disorder)   . Anxiety   . History of kidney stones     x1 15 years ago  . Restless leg syndrome     takes requip daily  . Cataract     left eye  . Headache     hx of migraines-none recent  . Rheumatoid arthritis(714.0)     sees Dr 08/06/15 @ [facility] in [place]  . Depression     takes Cymbalta daily  . ADD  (attention deficit disorder)     takes Ritalin daily  . GERD (gastroesophageal reflux disease)     takes Omeprazole daily  . Enlarged prostate     takes Flomax daily   . Numbness and tingling     fingers bilat comes and goes   . Wears glasses   . History of blood transfusion    Assessment: 59 yo male with rheumatoid disease who underwent a left primary total knee arthroplasty in May of this year. HE has continued to develp recurrent effusions with negative cultures.  Today pt had excision of his components  with repeat cultures followed by placement of an antibiotic spacer and 6 weeks of IV antibiotics.  Pt. Received vanc 1gm x 1 in OR Goal of Therapy:  Vancomycin trough level 15-20 mcg/ml  Plan:  Vancomycin 1gm IV q12h Follow cultures and renal function vanc trough at steady state  June RPh 08/06/2015, 9:58 PM Pager [phone]

## 2015-08-07 LAB — CBC
HCT: 27.5 % — ABNORMAL LOW (ref 39.0–52.0)
HEMOGLOBIN: 9.2 g/dL — AB (ref 13.0–17.0)
MCH: 29.8 pg (ref 26.0–34.0)
MCHC: 33.5 g/dL (ref 30.0–36.0)
MCV: 89 fL (ref 78.0–100.0)
Platelets: 246 10*3/uL (ref 150–400)
RBC: 3.09 MIL/uL — AB (ref 4.22–5.81)
RDW: 14.2 % (ref 11.5–15.5)
WBC: 7.2 10*3/uL (ref 4.0–10.5)

## 2015-08-07 LAB — BASIC METABOLIC PANEL
ANION GAP: 8 (ref 5–15)
BUN: 17 mg/dL (ref 6–20)
CALCIUM: 8.6 mg/dL — AB (ref 8.9–10.3)
CHLORIDE: 102 mmol/L (ref 101–111)
CO2: 27 mmol/L (ref 22–32)
CREATININE: 0.69 mg/dL (ref 0.61–1.24)
Glucose, Bld: 161 mg/dL — ABNORMAL HIGH (ref 65–99)
POTASSIUM: 4.5 mmol/L (ref 3.5–5.1)
SODIUM: 137 mmol/L (ref 135–145)

## 2015-08-07 MED ORDER — SODIUM CHLORIDE 0.9 % IJ SOLN: 10.0000 mL | Freq: Two times a day (BID) | INTRAMUSCULAR | Status: DC

## 2015-08-07 MED ORDER — SODIUM CHLORIDE 0.9 % IJ SOLN
10.0000 mL | INTRAMUSCULAR | Status: DC | PRN
Start: 2015-08-07 — End: 2015-08-09
  Administered 2015-08-09 (×2): 10 mL
  Filled 2015-08-07 (×2): qty 40

## 2015-08-07 NOTE — Progress Notes (Signed)
OT Cancellation Note  Patient Details Name: Thoms Barthelemy MRN: [id number] DOB: 12-02-1955    Cancelled Treatment:    Reason Eval/Treat Not Completed: OT screened, no needs identified, will sign off  Pt has needed DME from prior surgery.  No OT needs.   Alba Cory 08/07/2015, 2:01 PM

## 2015-08-07 NOTE — Progress Notes (Signed)
Subjective: 1 Day Post-Op Procedure(s) (LRB): EXCISION OF LEFT TOTAL KNEE ARTHROPLASTY WITH PLACEMENT OF ANTIBIOTIC SPACERS (Left) Patient reports pain as moderate.  Acute blood loss anemia as expected from this type of surgery; tolerating well.  Objective: Vital signs in last 24 hours: Temp:  [97.9 F (36.6 C)-98.4 F (36.9 C)] 98.4 F (36.9 C) (12/24 0618) Pulse Rate:  [81-111] 81 (12/24 0618) Resp:  [10-21] 16 (12/24 0618) BP: (139-193)/(70-99) 139/70 mmHg (12/24 0618) SpO2:  [96 %-100 %] 99 % (12/24 0618) Weight:  [75.297 kg (166 lb)] 75.297 kg (166 lb) (12/23 1500)  Intake/Output from previous day: 12/23 0701 - 12/24 0700 In: 2710 [P.O.:720; I.V.:1540; IV Piggyback:450] Out: 1265 [Urine:650; Drains:415; Blood:200] Intake/Output this shift:     Recent Labs  08/04/15 1130 08/07/15 0515  HGB 11.6* 9.2*    Recent Labs  08/04/15 1130 08/07/15 0515  WBC 6.2 7.2  RBC 3.88* 3.09*  HCT 35.2* 27.5*  PLT 261 246    Recent Labs  08/04/15 1130 08/07/15 0515  NA 140 137  K 4.2 4.5  CL 105 102  CO2 28 27  BUN 26* 17  CREATININE 0.70 0.69  GLUCOSE 107* 161*  CALCIUM 8.9 8.6*   No results for input(s): LABPT, INR in the last 72 hours.  Sensation intact distally Intact pulses distally Dorsiflexion/Plantar flexion intact Incision: dressing C/D/I  Assessment/Plan: 1 Day Post-Op Procedure(s) (LRB): EXCISION OF LEFT TOTAL KNEE ARTHROPLASTY WITH PLACEMENT OF ANTIBIOTIC SPACERS (Left) Up with therapy - 50% WB left leg in knee immobilizer; can have genti ROM of left knee. Will need PICC line for 6 weeks of IV antibiotics.  [name] 08/07/2015, 8:13 AM

## 2015-08-07 NOTE — Progress Notes (Signed)
Physical Therapy Treatment Patient Details Name: [name] MRN: [id number] DOB: 10-30-1955 Today's Date: 08/07/2015    History of Present Illness s/p L knee resection with spacer placement    PT Comments    Pt progressing well with mobility and demonstrating good ability to monitor and maintain PWB  Follow Up Recommendations  No PT follow up     Equipment Recommendations  None recommended by PT    Recommendations for Other Services       Precautions / Restrictions Precautions Precautions: Fall Precaution Comments: Dr Magnus Ivan reports placing articulating spacer to allow some ROM Required Braces or Orthoses: Knee Immobilizer - Left Knee Immobilizer - Left: On when out of bed or walking Restrictions Weight Bearing Restrictions: Yes LLE Weight Bearing: Partial weight bearing LLE Partial Weight Bearing Percentage or Pounds: 50%    Mobility  Bed Mobility Overal bed mobility: Needs Assistance Bed Mobility: Supine to Sit;Sit to Supine     Supine to sit: Min assist Sit to supine: Min assist   General bed mobility comments: cues and assist for L LE management  Transfers Overall transfer level: Needs assistance Equipment used: Rolling walker (2 wheeled) Transfers: Sit to/from Stand Sit to Stand: Min guard         General transfer comment: cues for LE management and use of UEs   Ambulation/Gait Ambulation/Gait assistance: Min guard Ambulation Distance (Feet): 100 Feet (twice) Assistive device: Rolling walker (2 wheeled) Gait Pattern/deviations: Step-to pattern;Step-through pattern;Decreased step length - right;Decreased step length - left;Shuffle;Trunk flexed     General Gait Details: min cues for sequence, posture and position from [employer]            Wheelchair Mobility    Modified Rankin (Stroke Patients Only)       Balance                                    Cognition Arousal/Alertness: Awake/alert Behavior During  Therapy: WFL for tasks assessed/performed Overall Cognitive Status: Within Functional Limits for tasks assessed                      Exercises      General Comments        Pertinent Vitals/Pain Pain Assessment: 0-10 Pain Score: 4  Pain Location: L knee Pain Descriptors / Indicators: Aching;Sore Pain Intervention(s): Limited activity within patient's tolerance;Monitored during session;Premedicated before session    Home Living                      Prior Function            PT Goals (current goals can now be found in the care plan section) Acute Rehab PT Goals Patient Stated Goal: Return for reimplantation of L knee PT Goal Formulation: With patient Time For Goal Achievement: 08/12/15 Potential to Achieve Goals: Good Progress towards PT goals: Progressing toward goals    Frequency  7X/week    PT Plan Current plan remains appropriate    Co-evaluation             End of Session Equipment Utilized During Treatment: Left knee immobilizer Activity Tolerance: Patient tolerated treatment well Patient left: in bed;with call bell/phone within reach;with family/visitor present     Time: 5885-0277 PT Time Calculation (min) (ACUTE ONLY): 20 min  Charges:  $Gait Training: 8-22 mins  G Codes:      Siyah Mault 2015-09-01, 3:55 PM

## 2015-08-07 NOTE — Evaluation (Signed)
Physical Therapy Evaluation Patient Details Name: [name] MRN: [id number] DOB: August 04, 1956 Today's Date: 08/07/2015   History of Present Illness  s/p L knee resection with spacer placement  Clinical Impression  Pt s/p L knee resection presents with decreased L LE strength/ROM and post op pain limiting functional mobility.  Pt should progress well to dc home with family assist.    Follow Up Recommendations No PT follow up    Equipment Recommendations  None recommended by PT    Recommendations for Other Services       Precautions / Restrictions Precautions Precautions: Fall Precaution Comments: Dr Magnus Ivan reports placing articulating spacer to allow some ROM Required Braces or Orthoses: Knee Immobilizer - Left Knee Immobilizer - Left: On when out of bed or walking Restrictions Weight Bearing Restrictions: Yes LLE Weight Bearing: Partial weight bearing LLE Partial Weight Bearing Percentage or Pounds: 50%      Mobility  Bed Mobility Overal bed mobility: Needs Assistance Bed Mobility: Supine to Sit     Supine to sit: Min assist     General bed mobility comments: cues and assist for L LE management  Transfers Overall transfer level: Needs assistance Equipment used: Rolling walker (2 wheeled) Transfers: Sit to/from Stand Sit to Stand: Min assist         General transfer comment: cues for LE management and use of UEs   Ambulation/Gait Ambulation/Gait assistance: Min assist;Min guard Ambulation Distance (Feet): 58 Feet Assistive device: Rolling walker (2 wheeled) Gait Pattern/deviations: Step-to pattern;Decreased step length - right;Decreased step length - left;Shuffle;Trunk flexed Gait velocity: dwecr Gait velocity interpretation: Below normal speed for age/gender General Gait Details: cues for sequence, posture and position from [employer]            Wheelchair Mobility    Modified Rankin (Stroke Patients Only)       Balance                                              Pertinent Vitals/Pain Pain Assessment: 0-10 Pain Score: 5  Pain Location: L knee Pain Descriptors / Indicators: Aching;Sore Pain Intervention(s): Limited activity within patient's tolerance;Monitored during session;Premedicated before session;Ice applied    Home Living Family/patient expects to be discharged to:: Private residence Living Arrangements: Spouse/significant other Available Help at Discharge: Available PRN/intermittently Type of Home: House Home Access: Stairs to enter Entrance Stairs-Rails: None Entrance Stairs-Number of Steps: 3 Home Layout: Two level Home Equipment: Crutches;Walker - 2 wheels;Bedside commode      Prior Function Level of Independence: Independent               Hand Dominance   Dominant Hand: Right    Extremity/Trunk Assessment   Upper Extremity Assessment: Overall WFL for tasks assessed           Lower Extremity Assessment: LLE deficits/detail   LLE Deficits / Details: KI in place  Cervical / Trunk Assessment: Normal  Communication   Communication: No difficulties  Cognition Arousal/Alertness: Awake/alert Behavior During Therapy: WFL for tasks assessed/performed Overall Cognitive Status: Within Functional Limits for tasks assessed                      General Comments      Exercises Total Joint Exercises Ankle Circles/Pumps: AROM;Both;15 reps;Supine      Assessment/Plan    PT Assessment Patient needs continued PT  services  PT Diagnosis Difficulty walking   PT Problem List Decreased strength;Decreased range of motion;Decreased activity tolerance;Decreased mobility;Decreased knowledge of use of DME;Pain  PT Treatment Interventions DME instruction;Gait training;Stair training;Functional mobility training;Therapeutic activities;Therapeutic exercise;Patient/family education   PT Goals (Current goals can be found in the Care Plan section) Acute Rehab PT  Goals Patient Stated Goal: Return for reimplantation of L knee PT Goal Formulation: With patient Time For Goal Achievement: 08/12/15 Potential to Achieve Goals: Good    Frequency 7X/week   Barriers to discharge        Co-evaluation               End of Session Equipment Utilized During Treatment: Gait belt;Left knee immobilizer Activity Tolerance: Patient tolerated treatment well Patient left: in chair;with call bell/phone within reach Nurse Communication: Mobility status         Time: 1037-1101 PT Time Calculation (min) (ACUTE ONLY): 24 min   Charges:   PT Evaluation $Initial PT Evaluation Tier I: 1 Procedure PT Treatments $Gait Training: 8-22 mins   PT G Codes:        [name] 29-Aug-2015, 12:58 PM

## 2015-08-07 NOTE — Discharge Instructions (Addendum)
Information on my medicine - XARELTO (Rivaroxaban)  Why was Xarelto prescribed for you? Xarelto was prescribed for you to reduce the risk of blood clots forming after orthopedic surgery. The medical term for these abnormal blood clots is venous thromboembolism (VTE).  What do you need to know about xarelto ? Take your Xarelto ONCE DAILY at the same time every day. You may take it either with or without food.  If you have difficulty swallowing the tablet whole, you may crush it and mix in applesauce just prior to taking your dose.  Take Xarelto exactly as prescribed by your doctor and DO NOT stop taking Xarelto without talking to the doctor who prescribed the medication.  Stopping without other VTE prevention medication to take the place of Xarelto may increase your risk of developing a clot.  After discharge, you should have regular check-up appointments with your healthcare provider that is prescribing your Xarelto.    What do you do if you miss a dose? If you miss a dose, take it as soon as you remember on the same day then continue your regularly scheduled once daily regimen the next day. Do not take two doses of Xarelto on the same day.   Important Safety Information A possible side effect of Xarelto is bleeding. You should call your healthcare provider right away if you experience any of the following: ? Bleeding from an injury or your nose that does not stop. ? Unusual colored urine (red or dark brown) or unusual colored stools (red or black). ? Unusual bruising for unknown reasons. ? A serious fall or if you hit your head (even if there is no bleeding).  Some medicines may interact with Xarelto and might increase your risk of bleeding while on Xarelto. To help avoid this, consult your healthcare provider or pharmacist prior to using any new prescription or non-prescription medications, including herbals, vitamins, non-steroidal anti-inflammatory drugs (NSAIDs) and  supplements.  This website has more information on Xarelto: VisitDestination.com.br.  YOU CAN BEND YOUR LEFT KNEE AS COMFORT ALLOWS. YOU CAN PUT HALF OF YOUR WEIGHT ON YOUR LEFT LEG AND OCCASIONALLY YOUR FULL WEIGHT. YOU CAN GET YOUR DRESSING WET DAILY IN THE SHOWER.

## 2015-08-07 NOTE — Progress Notes (Signed)
Peripherally Inserted Central Catheter/Midline Placement  The IV Nurse has discussed with the patient and/or persons authorized to consent for the patient, the purpose of this procedure and the potential benefits and risks involved with this procedure.  The benefits include less needle sticks, lab draws from the catheter and patient may be discharged home with the catheter.  Risks include, but not limited to, infection, bleeding, blood clot (thrombus formation), and puncture of an artery; nerve damage and irregular heat beat.  Alternatives to this procedure were also discussed.  PICC/Midline Placement Documentation  PICC Single Lumen 08/07/15 PICC Right Basilic 41 cm 0 cm (Active)  Indication for Insertion or Continuance of Line Home intravenous therapies (PICC only) 08/07/2015 10:00 AM  Exposed Catheter (cm) 0 cm 08/07/2015 10:00 AM  Site Assessment Clean;Dry;Intact 08/07/2015 10:00 AM  Line Status Flushed;Saline locked;Blood return noted 08/07/2015 10:00 AM  Dressing Type Transparent 08/07/2015 10:00 AM  Dressing Status Clean;Dry;Intact 08/07/2015 10:00 AM  Dressing Change Due 08/14/15 08/07/2015 10:00 AM       [name] 08/07/2015, 10:04 AM

## 2015-08-08 LAB — CBC
HEMATOCRIT: 22.6 % — AB (ref 39.0–52.0)
HEMOGLOBIN: 7.6 g/dL — AB (ref 13.0–17.0)
MCH: 30.3 pg (ref 26.0–34.0)
MCHC: 33.6 g/dL (ref 30.0–36.0)
MCV: 90 fL (ref 78.0–100.0)
Platelets: 186 10*3/uL (ref 150–400)
RBC: 2.51 MIL/uL — ABNORMAL LOW (ref 4.22–5.81)
RDW: 14.6 % (ref 11.5–15.5)
WBC: 6.7 10*3/uL (ref 4.0–10.5)

## 2015-08-08 LAB — VANCOMYCIN, TROUGH: Vancomycin Tr: 7 ug/mL — ABNORMAL LOW (ref 10.0–20.0)

## 2015-08-08 MED ORDER — VANCOMYCIN HCL 10 G IV SOLR
1500.0000 mg | Freq: Two times a day (BID) | INTRAVENOUS | Status: DC
  Administered 2015-08-09: 1500 mg via INTRAVENOUS
  Filled 2015-08-08: qty 1500

## 2015-08-08 MED ORDER — SODIUM CHLORIDE 0.9 % IV SOLN
500.0000 mg | INTRAVENOUS | Status: AC
  Administered 2015-08-08: 500 mg via INTRAVENOUS
  Filled 2015-08-08: qty 500

## 2015-08-08 NOTE — Progress Notes (Signed)
Physical Therapy Treatment Patient Details Name: [name] MRN: [id number] DOB: Nov 07, 1955 Today's Date: 08/08/2015    History of Present Illness s/p L knee resection with spacer placement    PT Comments    Pt progressing well with mobility.  Reviewed stairs.  Follow Up Recommendations  No PT follow up     Equipment Recommendations  None recommended by PT    Recommendations for Other Services       Precautions / Restrictions Precautions Precautions: Fall Precaution Comments: Dr Magnus Ivan reports placing articulating spacer to allow some ROM Required Braces or Orthoses: Knee Immobilizer - Left Knee Immobilizer - Left: On when out of bed or walking Restrictions Weight Bearing Restrictions: Yes LLE Weight Bearing: Partial weight bearing LLE Partial Weight Bearing Percentage or Pounds: 50%    Mobility  Bed Mobility Overal bed mobility: Needs Assistance Bed Mobility: Supine to Sit;Sit to Supine     Supine to sit: Min guard Sit to supine: Min guard   General bed mobility comments: Pt self assisting L LE with R LE  Transfers Overall transfer level: Needs assistance Equipment used: Rolling walker (2 wheeled) Transfers: Sit to/from Stand Sit to Stand: Min guard            Ambulation/Gait Ambulation/Gait assistance: Min guard Ambulation Distance (Feet): 100 Feet Assistive device: Rolling walker (2 wheeled) Gait Pattern/deviations: Step-to pattern;Decreased step length - right;Decreased step length - left;Shuffle Gait velocity: decr   General Gait Details: min cues for posture and position from RW   Stairs Stairs: Yes Stairs assistance: Min assist Stair Management: No rails;One rail Left;Step to pattern;Forwards;With crutches Number of Stairs: 8 General stair comments: 4 stairs with crutch/rail and 4 stairs with bil crutches  Wheelchair Mobility    Modified Rankin (Stroke Patients Only)       Balance                                     Cognition Arousal/Alertness: Awake/alert Behavior During Therapy: WFL for tasks assessed/performed Overall Cognitive Status: Within Functional Limits for tasks assessed                      Exercises      General Comments        Pertinent Vitals/Pain Pain Assessment: 0-10 Pain Score: 7  Pain Location: L knee Pain Descriptors / Indicators: Aching;Sore Pain Intervention(s): Limited activity within patient's tolerance;Monitored during session;Premedicated before session;Ice applied;Patient requesting pain meds-RN notified    Home Living                      Prior Function            PT Goals (current goals can now be found in the care plan section) Acute Rehab PT Goals Patient Stated Goal: Return for reimplantation of L knee PT Goal Formulation: With patient Time For Goal Achievement: 08/12/15 Potential to Achieve Goals: Good Progress towards PT goals: Progressing toward goals    Frequency  7X/week    PT Plan Current plan remains appropriate    Co-evaluation             End of Session Equipment Utilized During Treatment: Left knee immobilizer Activity Tolerance: Patient tolerated treatment well Patient left: in bed;with call bell/phone within reach;with family/visitor present     Time: 1941-7408 PT Time Calculation (min) (ACUTE ONLY): 35 min  Charges:  $Gait Training: 8-22  mins $Therapeutic Activity: 8-22 mins                    G Codes:      Daysi Boggan 08/13/2015, 9:46 AM

## 2015-08-08 NOTE — Progress Notes (Signed)
Subjective: 2 Days Post-Op Procedure(s) (LRB): EXCISION OF LEFT TOTAL KNEE ARTHROPLASTY WITH PLACEMENT OF ANTIBIOTIC SPACERS (Left) Patient reports pain as moderate.  Cultures still negative.  Acute blood loss anemia, but tolerating well.  Objective: Vital signs in last 24 hours: Temp:  [98.3 F (36.8 C)-99 F (37.2 C)] 98.6 F (37 C) (12/25 0510) Pulse Rate:  [83-95] 88 (12/25 0510) Resp:  [13-18] 18 (12/25 0510) BP: (125-137)/(60-66) 125/65 mmHg (12/25 0510) SpO2:  [96 %-99 %] 97 % (12/25 0510)  Intake/Output from previous day: 12/24 0701 - 12/25 0700 In: 840 [P.O.:840] Out: 895 [Urine:825; Drains:70] Intake/Output this shift: Total I/O In: -  Out: 20 [Drains:20]   Recent Labs  08/07/15 0515 08/08/15 0437  HGB 9.2* 7.6*    Recent Labs  08/07/15 0515 08/08/15 0437  WBC 7.2 6.7  RBC 3.09* 2.51*  HCT 27.5* 22.6*  PLT 246 186    Recent Labs  08/07/15 0515  NA 137  K 4.5  CL 102  CO2 27  BUN 17  CREATININE 0.69  GLUCOSE 161*  CALCIUM 8.6*   No results for input(s): LABPT, INR in the last 72 hours.  Sensation intact distally Intact pulses distally Dorsiflexion/Plantar flexion intact Incision: scant drainage No cellulitis present Compartment soft  Assessment/Plan: 2 Days Post-Op Procedure(s) (LRB): EXCISION OF LEFT TOTAL KNEE ARTHROPLASTY WITH PLACEMENT OF ANTIBIOTIC SPACERS (Left) Up with therapy Continue ABX therapy due to Culture taken of surgical site during joint revision  Discharge home tomorrow with home health set up for PICC line care and long-term antibiotics. Monitor H/H closely.  Uchechukwu Dhawan Y 08/08/2015, 11:06 AM

## 2015-08-08 NOTE — Progress Notes (Signed)
Pharmacy Antibiotic Follow-up Note  [name] is a 59 y.o. year-old male admitted on 08/06/2015.  The patient is currently on day 3 of Vancomycin for infected knee replacement.  Assessment/Plan:  Give additional 500mg  dose x1 tonight (Vanc 1g dose already given)  Increase to Vancomycin 1500 mg IV q12h.   Recheck SCr and Vanc trough at steady state.  Plan to discharge home on Monday with 6 weeks of IV antibiotics  Temp (24hrs), Avg:98.9 F (37.2 C), Min:98.6 F (37 C), Max:99.1 F (37.3 C)   Recent Labs Lab 08/04/15 1130 08/07/15 0515 08/08/15 0437  WBC 6.2 7.2 6.7    Recent Labs Lab 08/04/15 1130 08/07/15 0515  CREATININE 0.70 0.69   Estimated Creatinine Clearance: 91.8 mL/min (by C-G formula based on Cr of 0.69).    Levels/dose changes this admission: 12/25 VT = 7 on 1gm IV q12h   Thank you for allowing pharmacy to be a part of this patient's care.  1/26 PharmD, BCPS Pager [phone] 08/08/2015 4:58 PM

## 2015-08-08 NOTE — Progress Notes (Signed)
ANTIBIOTIC CONSULT NOTE  Pharmacy Consult for vancomycin Indication: infected total knee replacement  Allergies  Allergen Reactions  . Penicillins Rash    Has patient had a PCN reaction causing immediate rash, facial/tongue/throat swelling, SOB or lightheadedness with hypotension: No Has patient had a PCN reaction causing severe rash involving mucus membranes or skin necrosis: No- around scars.  Has patient had a PCN reaction that required hospitalization No Has patient had a PCN reaction occurring within the last 10 years: No If all of the above answers are "NO", then may proceed with Cephalosporin use.     Patient Measurements: Height: 5\' 7"  (170.2 cm) Weight: 166 lb (75.297 kg) (as per PST on 08/04/15) IBW/kg (Calculated) : 66.1   Vital Signs: Temp: 98.6 F (37 C) (12/25 0510) Temp Source: Oral (12/25 0510) BP: 125/65 mmHg (12/25 0510) Pulse Rate: 88 (12/25 0510) Intake/Output from previous day: 12/24 0701 - 12/25 0700 In: 840 [P.O.:840] Out: 895 [Urine:825; Drains:70] Intake/Output from this shift:    Labs:  Recent Labs  08/07/15 0515 08/08/15 0437  WBC 7.2 6.7  HGB 9.2* 7.6*  PLT 246 186  CREATININE 0.69  --    Estimated Creatinine Clearance: 91.8 mL/min (by C-G formula based on Cr of 0.69). No results for input(s): VANCOTROUGH, VANCOPEAK, VANCORANDOM, GENTTROUGH, GENTPEAK, GENTRANDOM, TOBRATROUGH, TOBRAPEAK, TOBRARND, AMIKACINPEAK, AMIKACINTROU, AMIKACIN in the last 72 hours.   Microbiology: Recent Results (from the past 720 hour(s))  Anaerobic culture     Status: None (Preliminary result)   Collection Time: 08/06/15  5:40 PM  Result Value Ref Range Status   Specimen Description WOUND  Final   Special Requests NONE  Final   Gram Stain   Final    FEW WBC PRESENT,BOTH PMN AND MONONUCLEAR NO SQUAMOUS EPITHELIAL CELLS SEEN NO ORGANISMS SEEN Performed at 08/08/15    Culture PENDING  Incomplete   Report Status PENDING  Incomplete  Wound  culture     Status: None (Preliminary result)   Collection Time: 08/06/15  5:40 PM  Result Value Ref Range Status   Specimen Description WOUND  Final   Special Requests NONE  Final   Gram Stain   Final    FEW WBC PRESENT,BOTH PMN AND MONONUCLEAR NO SQUAMOUS EPITHELIAL CELLS SEEN NO ORGANISMS SEEN Performed at 08/08/15    Culture   Final    NO GROWTH 1 DAY Performed at [facility]    Report Status PENDING  Incomplete    Medical History: Past Medical History  Diagnosis Date  . OCD (obsessive compulsive disorder)   . Anxiety   . History of kidney stones     x1 15 years ago  . Restless leg syndrome     takes requip daily  . Cataract     left eye  . Headache     hx of migraines-none recent  . Rheumatoid arthritis(714.0)     sees Dr [facility] @ [facility] in [place]  . Depression     takes Cymbalta daily  . ADD (attention deficit disorder)     takes Ritalin daily  . GERD (gastroesophageal reflux disease)     takes Omeprazole daily  . Enlarged prostate     takes Flomax daily   . Numbness and tingling     fingers bilat comes and goes   . Wears glasses   . History of blood transfusion    Assessment: 59 yo male with rheumatoid disease who underwent a left primary total knee arthroplasty in May  of this year. He has continued to develp recurrent effusions with negative cultures.  On 12/23, patient had excision of his components  with repeat cultures followed by placement of an antibiotic spacer. He was started on Vancomycin 1gm IV q12h.  Renal function has remained stable at patient's baseline.  Cx data remains negative.   Plan to discharge home on Monday with 6 weeks of IV antibiotics.  PICC line has been placed.   Antimicrobials this admission: 12/23 Vanc >>   Levels/dose changes this admission: 12/25 @ 1630:  VT_______ on 1gm IV q12h  Microbiology Results: 12/23 wound: NGTD  Goal of Therapy:  Vancomycin trough level 15-20 mcg/ml  Plan:  Continue  Vancomycin 1gm IV q12h Follow cultures and renal function Check Vancomycin trough level prior to tonight's dose Patient will need weekly Vancomycin trough and Scr while on Vancomycin.   Junita Push, PharmD, BCPS Pager: [phone] 08/08/2015, 9:35 AM

## 2015-08-09 LAB — BASIC METABOLIC PANEL
Anion gap: 7 (ref 5–15)
BUN: 13 mg/dL (ref 6–20)
CHLORIDE: 100 mmol/L — AB (ref 101–111)
CO2: 32 mmol/L (ref 22–32)
CREATININE: 0.75 mg/dL (ref 0.61–1.24)
Calcium: 8.1 mg/dL — ABNORMAL LOW (ref 8.9–10.3)
Glucose, Bld: 138 mg/dL — ABNORMAL HIGH (ref 65–99)
Potassium: 3.6 mmol/L (ref 3.5–5.1)
SODIUM: 139 mmol/L (ref 135–145)

## 2015-08-09 LAB — CBC
HCT: 22 % — ABNORMAL LOW (ref 39.0–52.0)
HEMOGLOBIN: 7.6 g/dL — AB (ref 13.0–17.0)
MCH: 31.1 pg (ref 26.0–34.0)
MCHC: 34.5 g/dL (ref 30.0–36.0)
MCV: 90.2 fL (ref 78.0–100.0)
PLATELETS: 187 10*3/uL (ref 150–400)
RBC: 2.44 MIL/uL — AB (ref 4.22–5.81)
RDW: 14.7 % (ref 11.5–15.5)
WBC: 5.9 10*3/uL (ref 4.0–10.5)

## 2015-08-09 LAB — WOUND CULTURE: Culture: NO GROWTH

## 2015-08-09 MED ORDER — RIVAROXABAN 10 MG PO TABS: 10.0000 mg | ORAL_TABLET | Freq: Every day | ORAL | Status: DC

## 2015-08-09 MED ORDER — HEPARIN SOD (PORK) LOCK FLUSH 100 UNIT/ML IV SOLN
250.0000 [IU] | INTRAVENOUS | Status: AC | PRN
  Administered 2015-08-09: 250 [IU]

## 2015-08-09 MED ORDER — OXYCODONE HCL 5 MG PO TABS: 5.0000 mg | ORAL_TABLET | Freq: Four times a day (QID) | ORAL | Status: DC | PRN

## 2015-08-09 MED ORDER — SODIUM CHLORIDE 0.9 % IV SOLN: 1500.0000 mg | Freq: Two times a day (BID) | INTRAVENOUS | Status: DC

## 2015-08-09 NOTE — Progress Notes (Signed)
Patient ID: [name], male   DOB: [birth year], 59 y.o.   MRN: [id number] Continues to improve.  Vitals stable.  H/H stable and asymptomatic from acute blood loss anemia.  Can be out or the knee immobilizer now and work on knee motion as well.  Will discharge to home today.

## 2015-08-09 NOTE — Discharge Summary (Signed)
Patient ID: [name] MRN: [id number] DOB/AGE: [birth year] 59 y.o.  Admit date: 08/06/2015 Discharge date: 08/09/2015  Admission Diagnoses:  Principal Problem:   Effusion of left knee joint; questionable prosthetic joint infection Active Problems:   Infection of total left knee replacement [facility])   Discharge Diagnoses:  Same  Past Medical History  Diagnosis Date  . OCD (obsessive compulsive disorder)   . Anxiety   . History of kidney stones     x1 15 years ago  . Restless leg syndrome     takes requip daily  . Cataract     left eye  . Headache     hx of migraines-none recent  . Rheumatoid arthritis(714.0)     sees Dr Sharmon Revere @ [facility] in [place]  . Depression     takes Cymbalta daily  . ADD (attention deficit disorder)     takes Ritalin daily  . GERD (gastroesophageal reflux disease)     takes Omeprazole daily  . Enlarged prostate     takes Flomax daily   . Numbness and tingling     fingers bilat comes and goes   . Wears glasses   . History of blood transfusion     Surgeries: Procedure(s): EXCISION OF LEFT TOTAL KNEE ARTHROPLASTY WITH PLACEMENT OF ANTIBIOTIC SPACERS on 08/06/2015   Consultants:    Discharged Condition: Improved  Hospital Course: Fuller Makin is an 59 y.o. male who was admitted 08/06/2015 for operative treatment ofEffusion of left knee joint. Patient has severe unremitting pain that affects sleep, daily activities, and work/hobbies. After pre-op clearance the patient was taken to the operating room on 08/06/2015 and underwent  Procedure(s): EXCISION OF LEFT TOTAL KNEE ARTHROPLASTY WITH PLACEMENT OF ANTIBIOTIC SPACERS.    Patient was given perioperative antibiotics: Anti-infectives    Start     Dose/Rate Route Frequency Ordered Stop   08/09/15 0500  vancomycin (VANCOCIN) 1,500 mg in sodium chloride 0.9 % 500 mL IVPB     1,500 mg 250 mL/hr over 120 Minutes Intravenous Every 12 hours 08/08/15 1845     08/09/15 0000  vancomycin 1,500 mg  in sodium chloride 0.9 % 500 mL     1,500 mg 250 mL/hr over 120 Minutes Intravenous Every 12 hours 08/09/15 0925     08/08/15 1900  vancomycin (VANCOCIN) 500 mg in sodium chloride 0.9 % 100 mL IVPB     500 mg 100 mL/hr over 60 Minutes Intravenous STAT 08/08/15 1845 08/08/15 2131   08/07/15 0500  vancomycin (VANCOCIN) IVPB 1000 mg/200 mL premix  Status:  Discontinued     1,000 mg 200 mL/hr over 60 Minutes Intravenous Every 12 hours 08/06/15 2158 08/08/15 1845   08/06/15 1750  tobramycin (NEBCIN) powder  Status:  Discontinued       As needed 08/06/15 1750 08/06/15 1939   08/06/15 1748  vancomycin (VANCOCIN) powder  Status:  Discontinued       As needed 08/06/15 1748 08/06/15 1939       Patient was given sequential compression devices, early ambulation, and chemoprophylaxis to prevent DVT.  Patient benefited maximally from hospital stay and there were no complications.   Cultures remained negative throughout hospital stay.  Recent vital signs: Patient Vitals for the past 24 hrs:  BP Temp Temp src Pulse Resp SpO2  08/09/15 0627 112/60 mmHg 98.6 F (37 C) Oral 87 18 99 %  08/08/15 2224 118/65 mmHg 98.7 F (37.1 C) Oral 97 18 98 %  08/08/15 1400 (!) 119/53 mmHg 99.1 F (37.3 C)  Oral 99 18 99 %     Recent laboratory studies:  Recent Labs  08/07/15 0515 08/08/15 0437 08/09/15 0444  WBC 7.2 6.7 5.9  HGB 9.2* 7.6* 7.6*  HCT 27.5* 22.6* 22.0*  PLT 246 186 187  NA 137  --  139  K 4.5  --  3.6  CL 102  --  100*  CO2 27  --  32  BUN 17  --  13  CREATININE 0.69  --  0.75  GLUCOSE 161*  --  138*  CALCIUM 8.6*  --  8.1*     Discharge Medications:     Medication List    STOP taking these medications        ibuprofen 200 MG tablet  Commonly known as:  ADVIL,MOTRIN      TAKE these medications        DULoxetine 60 MG capsule  Commonly known as:  CYMBALTA  Take 60 mg by mouth every morning.     folic acid 1 MG tablet  Commonly known as:  FOLVITE  Take 1 mg by mouth at  bedtime.     leflunomide 10 MG tablet  Commonly known as:  ARAVA  Take 10 mg by mouth at bedtime.     methotrexate 2.5 MG tablet  Commonly known as:  RHEUMATREX  Take 12.5 mg by mouth every 7 (seven) days. Sunday     methylphenidate 20 MG tablet  Commonly known as:  RITALIN  Take 10 mg by mouth 2 (two) times daily with breakfast and lunch.     multivitamin with minerals Tabs tablet  Take 1 tablet by mouth daily.     omeprazole 20 MG capsule  Commonly known as:  PRILOSEC  Take 20 mg by mouth 2 (two) times daily.     oxyCODONE 5 MG immediate release tablet  Commonly known as:  Oxy IR/ROXICODONE  Take 1-2 tablets (5-10 mg total) by mouth every 6 (six) hours as needed for severe pain.     pramipexole 0.25 MG tablet  Commonly known as:  MIRAPEX  Take 0.25 mg by mouth 2 (two) times daily.     rivaroxaban 10 MG Tabs tablet  Commonly known as:  XARELTO  Take 1 tablet (10 mg total) by mouth daily with breakfast.     tamsulosin 0.4 MG Caps capsule  Commonly known as:  FLOMAX  Take 0.4 mg by mouth daily after breakfast.     vancomycin 1,500 mg in sodium chloride 0.9 % 500 mL  Inject 1,500 mg into the vein every 12 (twelve) hours.        Diagnostic Studies: No results found.  Disposition: 01-Home or Self Care      Discharge Instructions    Discharge patient    Complete by:  As directed            Follow-up Information    Follow up with Kathryne Hitch, MD. Schedule an appointment as soon as possible for a visit in 2 weeks.   Specialty:  Orthopedic Surgery   Contact information:   [address] [place] [address] [phone]        Signed: Kathryne Hitch 08/09/2015, 9:25 AM

## 2015-08-09 NOTE — Progress Notes (Signed)
Physical Therapy Treatment Patient Details Name: [name] MRN: [id number] DOB: [birth year] Today's Date: 08/09/2015    History of Present Illness s/p L knee resection with spacer placement    PT Comments    POD # 3 Pt able to get self OOB using opposite LE.  amb in hallway and performed some L LE TE's.  Pt allowed some/gentle knee ROM.  Pt ready for D/C to home.  Follow Up Recommendations  No PT follow up     Equipment Recommendations  None recommended by PT    Recommendations for Other Services       Precautions / Restrictions Precautions Precautions: Fall Precaution Comments: Dr Magnus Ivan reports placing articulating spacer to allow some ROM.  no longer needs to wear KI.  Dr Magnus Ivan stated, "he's a young, fit guy" does not need to wear KI. Restrictions Weight Bearing Restrictions: Yes LLE Weight Bearing: Partial weight bearing LLE Partial Weight Bearing Percentage or Pounds: 50%    Mobility  Bed Mobility Overal bed mobility: Modified Independent             General bed mobility comments: Pt self assisting L LE with R LE  Transfers Overall transfer level: Modified independent Equipment used: Rolling walker (2 wheeled) Transfers: Sit to/from Stand Sit to Stand: Modified independent (Device/Increase time)         General transfer comment: good safety cognition and use of hands  Ambulation/Gait Ambulation/Gait assistance: Supervision;Modified independent (Device/Increase time) Ambulation Distance (Feet): 125 Feet Assistive device: Rolling walker (2 wheeled) Gait Pattern/deviations: Step-to pattern;Decreased stance time - left Gait velocity: decr   General Gait Details: min cues for posture and position from [employer]         General stair comments: pt stated he practiced stairs yesterday, and did not feel need to repeat again today  Wheelchair Mobility    Modified Rankin (Stroke Patients Only)       Balance                                     Cognition Arousal/Alertness: Awake/alert Behavior During Therapy: WFL for tasks assessed/performed Overall Cognitive Status: Within Functional Limits for tasks assessed                      Exercises      General Comments        Pertinent Vitals/Pain Pain Assessment: 0-10 Pain Score: 8  Pain Location: L knee Pain Descriptors / Indicators: Sore;Discomfort;Grimacing Pain Intervention(s): Monitored during session;Repositioned;Ice applied    Home Living                      Prior Function            PT Goals (current goals can now be found in the care plan section) Progress towards PT goals: Progressing toward goals    Frequency  7X/week    PT Plan Current plan remains appropriate    Co-evaluation             End of Session Equipment Utilized During Treatment: Gait belt Activity Tolerance: Patient tolerated treatment well Patient left: in chair;with call bell/phone within reach     Time: 0950-1025 PT Time Calculation (min) (ACUTE ONLY): 35 min  Charges:  $Gait Training: 8-22 mins $Therapeutic Exercise: 8-22 mins  G Codes:      Rica Koyanagi  PTA [facility]  Acute  Rehab Pager      [phone]

## 2015-08-09 NOTE — Progress Notes (Signed)
Utilization review completed.  

## 2015-08-09 NOTE — Progress Notes (Signed)
Discharge instructions given to patients along with prescriptions.  Questions answered.  [facility] to give PICC abx instructions.

## 2015-08-09 NOTE — Care Management Note (Signed)
Case Management Note  Patient Details  Name: [name] MRN: [id number] Date of Birth: [birth year]  Subjective/Objective:   S/p Excision arthroplasty of entire left total knee Arthroplasty with placement of antibiotic spacer                 Action/Plan: Discharge planning, spoke with patient at bedside. Have chosen [place] for [facility] services. Contacted [facility] for referral, patient is out of network so they are unable to take. Contacted [facility], they are able to provide infusion therapy. Patient agreeable. Awaiting final orders, anticipate d/c today.   Expected Discharge Date:                  Expected Discharge Plan:  Home w Home Health Services  In-House Referral:  NA  Discharge planning Services  CM Consult  Post Acute Care Choice:  Home Health Choice offered to:  Patient  DME Arranged:  IV pump/equipment DME Agency:  [facility]  HH Arranged:  RN, Disease Management HH Agency:  [facility]  Status of Service:  Completed, signed off  Medicare Important Message Given:    Date Medicare IM Given:    Medicare IM give by:    Date Additional Medicare IM Given:    Additional Medicare Important Message give by:     If discussed at Long Length of Stay Meetings, dates discussed:    Additional Comments:  Alexis Goodell, RN 08/09/2015, 10:54 AM

## 2015-08-10 ENCOUNTER — Encounter (HOSPITAL_COMMUNITY): Payer: Self-pay | Admitting: Orthopaedic Surgery

## 2015-08-11 LAB — ANAEROBIC CULTURE

## 2015-09-17 ENCOUNTER — Other Ambulatory Visit (HOSPITAL_COMMUNITY): Payer: Self-pay | Admitting: Orthopaedic Surgery

## 2015-09-17 ENCOUNTER — Encounter (HOSPITAL_COMMUNITY): Payer: Self-pay | Admitting: *Deleted

## 2015-09-17 NOTE — Progress Notes (Signed)
Pt denies cardiac history, chest pain or sob. 

## 2015-09-19 MED ORDER — CLINDAMYCIN PHOSPHATE 900 MG/50ML IV SOLN
900.0000 mg | INTRAVENOUS | Status: AC
  Administered 2015-09-20: 900 mg via INTRAVENOUS
  Filled 2015-09-19: qty 50

## 2015-09-20 ENCOUNTER — Encounter (HOSPITAL_COMMUNITY): Payer: Self-pay | Admitting: *Deleted

## 2015-09-20 ENCOUNTER — Inpatient Hospital Stay (HOSPITAL_COMMUNITY): Payer: Medicare HMO | Admitting: Anesthesiology

## 2015-09-20 ENCOUNTER — Inpatient Hospital Stay (HOSPITAL_COMMUNITY)
Admission: RE | Admit: 2015-09-20 | Discharge: 2015-09-23 | DRG: 467 | Disposition: A | Discharge status: Home Health | Payer: Medicare HMO | Source: Ambulatory Visit | Attending: Orthopaedic Surgery | Admitting: Orthopaedic Surgery

## 2015-09-20 ENCOUNTER — Encounter (HOSPITAL_COMMUNITY)
Admission: RE | Disposition: A | Discharge status: Home Health | Payer: Self-pay | Source: Ambulatory Visit | Attending: Orthopaedic Surgery

## 2015-09-20 ENCOUNTER — Inpatient Hospital Stay (HOSPITAL_COMMUNITY): Payer: Medicare HMO

## 2015-09-20 DIAGNOSIS — Z7901 Long term (current) use of anticoagulants: Secondary | ICD-10-CM | POA: Diagnosis not present

## 2015-09-20 DIAGNOSIS — Z89529 Acquired absence of unspecified knee: Secondary | ICD-10-CM

## 2015-09-20 DIAGNOSIS — F429 Obsessive-compulsive disorder, unspecified: Secondary | ICD-10-CM | POA: Diagnosis present

## 2015-09-20 DIAGNOSIS — Z981 Arthrodesis status: Secondary | ICD-10-CM | POA: Diagnosis not present

## 2015-09-20 DIAGNOSIS — K219 Gastro-esophageal reflux disease without esophagitis: Secondary | ICD-10-CM | POA: Diagnosis present

## 2015-09-20 DIAGNOSIS — Z791 Long term (current) use of non-steroidal anti-inflammatories (NSAID): Secondary | ICD-10-CM | POA: Diagnosis not present

## 2015-09-20 DIAGNOSIS — F419 Anxiety disorder, unspecified: Secondary | ICD-10-CM | POA: Diagnosis present

## 2015-09-20 DIAGNOSIS — Z79899 Other long term (current) drug therapy: Secondary | ICD-10-CM | POA: Diagnosis not present

## 2015-09-20 DIAGNOSIS — G2581 Restless legs syndrome: Secondary | ICD-10-CM | POA: Diagnosis present

## 2015-09-20 DIAGNOSIS — D62 Acute posthemorrhagic anemia: Secondary | ICD-10-CM | POA: Diagnosis not present

## 2015-09-20 DIAGNOSIS — F988 Other specified behavioral and emotional disorders with onset usually occurring in childhood and adolescence: Secondary | ICD-10-CM | POA: Diagnosis present

## 2015-09-20 DIAGNOSIS — N400 Enlarged prostate without lower urinary tract symptoms: Secondary | ICD-10-CM | POA: Diagnosis present

## 2015-09-20 DIAGNOSIS — F329 Major depressive disorder, single episode, unspecified: Secondary | ICD-10-CM | POA: Diagnosis present

## 2015-09-20 DIAGNOSIS — M21862 Other specified acquired deformities of left lower leg: Secondary | ICD-10-CM | POA: Diagnosis present

## 2015-09-20 DIAGNOSIS — M069 Rheumatoid arthritis, unspecified: Secondary | ICD-10-CM | POA: Diagnosis present

## 2015-09-20 DIAGNOSIS — Z96641 Presence of right artificial hip joint: Secondary | ICD-10-CM | POA: Diagnosis present

## 2015-09-20 DIAGNOSIS — Z96652 Presence of left artificial knee joint: Secondary | ICD-10-CM

## 2015-09-20 DIAGNOSIS — M25462 Effusion, left knee: Secondary | ICD-10-CM | POA: Diagnosis present

## 2015-09-20 HISTORY — PX: TOTAL KNEE REVISION: SHX996

## 2015-09-20 HISTORY — DX: Anemia, unspecified: D64.9

## 2015-09-20 LAB — PROTIME-INR
INR: 1.13 (ref 0.00–1.49)
Prothrombin Time: 14.6 seconds (ref 11.6–15.2)

## 2015-09-20 SURGERY — TOTAL KNEE REVISION
Anesthesia: General | Site: Knee | Laterality: Left

## 2015-09-20 MED ORDER — FENTANYL CITRATE (PF) 250 MCG/5ML IJ SOLN
INTRAMUSCULAR | Status: AC
  Filled 2015-09-20: qty 5

## 2015-09-20 MED ORDER — METOCLOPRAMIDE HCL 5 MG/ML IJ SOLN: 5.0000 mg | Freq: Three times a day (TID) | INTRAMUSCULAR | Status: DC | PRN

## 2015-09-20 MED ORDER — HYDROMORPHONE HCL 1 MG/ML IJ SOLN
INTRAMUSCULAR | Status: AC
  Filled 2015-09-20: qty 1

## 2015-09-20 MED ORDER — SODIUM CHLORIDE 0.9 % IV SOLN
INTRAVENOUS | Status: DC
  Administered 2015-09-20: 22:00:00 via INTRAVENOUS

## 2015-09-20 MED ORDER — HYDROMORPHONE HCL 1 MG/ML IJ SOLN
INTRAMUSCULAR | Status: AC
  Administered 2015-09-20: 0.5 mg via INTRAVENOUS
  Filled 2015-09-20: qty 1

## 2015-09-20 MED ORDER — MIDAZOLAM HCL 2 MG/2ML IJ SOLN
INTRAMUSCULAR | Status: AC
  Filled 2015-09-20: qty 2

## 2015-09-20 MED ORDER — CLINDAMYCIN PHOSPHATE 600 MG/50ML IV SOLN
600.0000 mg | Freq: Four times a day (QID) | INTRAVENOUS | Status: AC
  Administered 2015-09-20 – 2015-09-21 (×2): 600 mg via INTRAVENOUS
  Filled 2015-09-20 (×2): qty 50

## 2015-09-20 MED ORDER — OXYCODONE HCL 5 MG PO TABS
5.0000 mg | ORAL_TABLET | ORAL | Status: DC | PRN
  Administered 2015-09-20 – 2015-09-23 (×9): 15 mg via ORAL
  Filled 2015-09-20 (×10): qty 3

## 2015-09-20 MED ORDER — DULOXETINE HCL 60 MG PO CPEP
60.0000 mg | ORAL_CAPSULE | Freq: Every morning | ORAL | Status: DC
  Administered 2015-09-21 – 2015-09-23 (×3): 60 mg via ORAL
  Filled 2015-09-20: qty 2
  Filled 2015-09-20 (×2): qty 1

## 2015-09-20 MED ORDER — NEOSTIGMINE METHYLSULFATE 10 MG/10ML IV SOLN
INTRAVENOUS | Status: DC | PRN
  Administered 2015-09-20: 4 mg via INTRAVENOUS

## 2015-09-20 MED ORDER — FOLIC ACID 1 MG PO TABS
1.0000 mg | ORAL_TABLET | Freq: Every day | ORAL | Status: DC
  Administered 2015-09-20 – 2015-09-22 (×3): 1 mg via ORAL
  Filled 2015-09-20 (×3): qty 1

## 2015-09-20 MED ORDER — METHYLPHENIDATE HCL 5 MG PO TABS
10.0000 mg | ORAL_TABLET | Freq: Two times a day (BID) | ORAL | Status: DC
  Administered 2015-09-21 – 2015-09-22 (×3): 10 mg via ORAL
  Filled 2015-09-20 (×3): qty 2

## 2015-09-20 MED ORDER — LEFLUNOMIDE 20 MG PO TABS
10.0000 mg | ORAL_TABLET | Freq: Every day | ORAL | Status: DC
  Administered 2015-09-20 – 2015-09-22 (×3): 10 mg via ORAL
  Filled 2015-09-20 (×5): qty 1

## 2015-09-20 MED ORDER — PHENOL 1.4 % MT LIQD: 1.0000 | OROMUCOSAL | Status: DC | PRN

## 2015-09-20 MED ORDER — DIPHENHYDRAMINE HCL 12.5 MG/5ML PO ELIX: 12.5000 mg | ORAL_SOLUTION | ORAL | Status: DC | PRN

## 2015-09-20 MED ORDER — HYDROMORPHONE HCL 1 MG/ML IJ SOLN
INTRAMUSCULAR | Status: DC | PRN
  Administered 2015-09-20: .2 mg via INTRAVENOUS
  Administered 2015-09-20: .4 mg via INTRAVENOUS
  Administered 2015-09-20 (×2): .2 mg via INTRAVENOUS

## 2015-09-20 MED ORDER — FENTANYL CITRATE (PF) 100 MCG/2ML IJ SOLN
INTRAMUSCULAR | Status: DC | PRN
  Administered 2015-09-20 (×2): 50 ug via INTRAVENOUS
  Administered 2015-09-20: 100 ug via INTRAVENOUS
  Administered 2015-09-20: 50 ug via INTRAVENOUS
  Administered 2015-09-20: 100 ug via INTRAVENOUS
  Administered 2015-09-20 (×3): 50 ug via INTRAVENOUS

## 2015-09-20 MED ORDER — LIDOCAINE HCL (CARDIAC) 20 MG/ML IV SOLN
INTRAVENOUS | Status: AC
  Filled 2015-09-20: qty 5

## 2015-09-20 MED ORDER — ONDANSETRON HCL 4 MG/2ML IJ SOLN
INTRAMUSCULAR | Status: AC
  Filled 2015-09-20: qty 2

## 2015-09-20 MED ORDER — METHOCARBAMOL 500 MG PO TABS
500.0000 mg | ORAL_TABLET | Freq: Four times a day (QID) | ORAL | Status: DC | PRN
  Administered 2015-09-21 – 2015-09-22 (×3): 500 mg via ORAL
  Filled 2015-09-20 (×4): qty 1

## 2015-09-20 MED ORDER — MIDAZOLAM HCL 2 MG/2ML IJ SOLN
INTRAMUSCULAR | Status: AC
  Administered 2015-09-20: 2 mg
  Filled 2015-09-20: qty 2

## 2015-09-20 MED ORDER — MENTHOL 3 MG MT LOZG: 1.0000 | LOZENGE | OROMUCOSAL | Status: DC | PRN

## 2015-09-20 MED ORDER — ROCURONIUM BROMIDE 50 MG/5ML IV SOLN
INTRAVENOUS | Status: AC
  Filled 2015-09-20: qty 1

## 2015-09-20 MED ORDER — OXYCODONE HCL 5 MG PO TABS
10.0000 mg | ORAL_TABLET | Freq: Once | ORAL | Status: AC | PRN
  Administered 2015-09-20: 10 mg via ORAL

## 2015-09-20 MED ORDER — FENTANYL CITRATE (PF) 100 MCG/2ML IJ SOLN
INTRAMUSCULAR | Status: AC
  Administered 2015-09-20: 50 ug
  Filled 2015-09-20: qty 2

## 2015-09-20 MED ORDER — LACTATED RINGERS IV SOLN
INTRAVENOUS | Status: DC | PRN
  Administered 2015-09-20 (×2): via INTRAVENOUS

## 2015-09-20 MED ORDER — PROPOFOL 10 MG/ML IV BOLUS
INTRAVENOUS | Status: DC | PRN
  Administered 2015-09-20: 150 mg via INTRAVENOUS

## 2015-09-20 MED ORDER — PHENYLEPHRINE HCL 10 MG/ML IJ SOLN
10.0000 mg | INTRAVENOUS | Status: DC | PRN
  Administered 2015-09-20: 20 ug/min via INTRAVENOUS

## 2015-09-20 MED ORDER — LIDOCAINE HCL (CARDIAC) 20 MG/ML IV SOLN
INTRAVENOUS | Status: DC | PRN
  Administered 2015-09-20: 80 mg via INTRAVENOUS

## 2015-09-20 MED ORDER — ZOLPIDEM TARTRATE 5 MG PO TABS: 5.0000 mg | ORAL_TABLET | Freq: Every evening | ORAL | Status: DC | PRN

## 2015-09-20 MED ORDER — TAMSULOSIN HCL 0.4 MG PO CAPS
0.4000 mg | ORAL_CAPSULE | Freq: Every day | ORAL | Status: DC
  Administered 2015-09-21 – 2015-09-23 (×3): 0.4 mg via ORAL
  Filled 2015-09-20 (×4): qty 1

## 2015-09-20 MED ORDER — METOCLOPRAMIDE HCL 5 MG PO TABS
5.0000 mg | ORAL_TABLET | Freq: Three times a day (TID) | ORAL | Status: DC | PRN
Start: 2015-09-20 — End: 2015-09-23

## 2015-09-20 MED ORDER — PANTOPRAZOLE SODIUM 40 MG PO TBEC
80.0000 mg | DELAYED_RELEASE_TABLET | Freq: Every day | ORAL | Status: DC
  Administered 2015-09-21 – 2015-09-23 (×3): 80 mg via ORAL
  Filled 2015-09-20 (×3): qty 2

## 2015-09-20 MED ORDER — ONDANSETRON HCL 4 MG/2ML IJ SOLN
INTRAMUSCULAR | Status: DC | PRN
  Administered 2015-09-20: 4 mg via INTRAVENOUS

## 2015-09-20 MED ORDER — HYDROMORPHONE HCL 1 MG/ML IJ SOLN
0.2500 mg | INTRAMUSCULAR | Status: DC | PRN
  Administered 2015-09-20 (×4): 0.5 mg via INTRAVENOUS

## 2015-09-20 MED ORDER — PROMETHAZINE HCL 25 MG/ML IJ SOLN: 6.2500 mg | INTRAMUSCULAR | Status: DC | PRN

## 2015-09-20 MED ORDER — ROCURONIUM BROMIDE 100 MG/10ML IV SOLN
INTRAVENOUS | Status: DC | PRN
  Administered 2015-09-20: 50 mg via INTRAVENOUS

## 2015-09-20 MED ORDER — ALUM & MAG HYDROXIDE-SIMETH 200-200-20 MG/5ML PO SUSP: 30.0000 mL | ORAL | Status: DC | PRN

## 2015-09-20 MED ORDER — ONDANSETRON HCL 4 MG/2ML IJ SOLN: 4.0000 mg | Freq: Four times a day (QID) | INTRAMUSCULAR | Status: DC | PRN

## 2015-09-20 MED ORDER — SODIUM CHLORIDE 0.9 % IR SOLN
Status: DC | PRN
  Administered 2015-09-20: 1

## 2015-09-20 MED ORDER — METHOCARBAMOL 1000 MG/10ML IJ SOLN
500.0000 mg | Freq: Four times a day (QID) | INTRAVENOUS | Status: DC | PRN
  Filled 2015-09-20: qty 5

## 2015-09-20 MED ORDER — HYDROMORPHONE HCL 1 MG/ML IJ SOLN
1.0000 mg | INTRAMUSCULAR | Status: DC | PRN
  Administered 2015-09-20 – 2015-09-22 (×10): 1 mg via INTRAVENOUS
  Filled 2015-09-20 (×10): qty 1

## 2015-09-20 MED ORDER — PHENYLEPHRINE HCL 10 MG/ML IJ SOLN
INTRAMUSCULAR | Status: AC
  Filled 2015-09-20: qty 1

## 2015-09-20 MED ORDER — PRAMIPEXOLE DIHYDROCHLORIDE 1 MG PO TABS
1.0000 mg | ORAL_TABLET | Freq: Three times a day (TID) | ORAL | Status: DC
  Administered 2015-09-20 – 2015-09-23 (×9): 1 mg via ORAL
  Filled 2015-09-20 (×13): qty 1

## 2015-09-20 MED ORDER — ROPIVACAINE HCL 5 MG/ML IJ SOLN
INTRAMUSCULAR | Status: DC | PRN
  Administered 2015-09-20: 30 mL via PERINEURAL

## 2015-09-20 MED ORDER — RIVAROXABAN 10 MG PO TABS
10.0000 mg | ORAL_TABLET | Freq: Every day | ORAL | Status: DC
  Administered 2015-09-21 – 2015-09-22 (×2): 10 mg via ORAL
  Filled 2015-09-20 (×2): qty 1

## 2015-09-20 MED ORDER — PROPOFOL 10 MG/ML IV BOLUS
INTRAVENOUS | Status: AC
  Filled 2015-09-20: qty 20

## 2015-09-20 MED ORDER — ONDANSETRON HCL 4 MG PO TABS: 4.0000 mg | ORAL_TABLET | Freq: Four times a day (QID) | ORAL | Status: DC | PRN

## 2015-09-20 MED ORDER — ACETAMINOPHEN 650 MG RE SUPP: 650.0000 mg | Freq: Four times a day (QID) | RECTAL | Status: DC | PRN

## 2015-09-20 MED ORDER — ACETAMINOPHEN 325 MG PO TABS
650.0000 mg | ORAL_TABLET | Freq: Four times a day (QID) | ORAL | Status: DC | PRN
Start: 2015-09-20 — End: 2015-09-23

## 2015-09-20 MED ORDER — DOCUSATE SODIUM 100 MG PO CAPS
100.0000 mg | ORAL_CAPSULE | Freq: Two times a day (BID) | ORAL | Status: DC
  Administered 2015-09-20 – 2015-09-23 (×6): 100 mg via ORAL
  Filled 2015-09-20 (×6): qty 1

## 2015-09-20 MED ORDER — GLYCOPYRROLATE 0.2 MG/ML IJ SOLN
INTRAMUSCULAR | Status: DC | PRN
  Administered 2015-09-20: .6 mg via INTRAVENOUS

## 2015-09-20 MED ORDER — MIDAZOLAM HCL 5 MG/5ML IJ SOLN
INTRAMUSCULAR | Status: DC | PRN
  Administered 2015-09-20: 1 mg via INTRAVENOUS

## 2015-09-20 MED ORDER — FERROUS SULFATE 325 (65 FE) MG PO TABS
325.0000 mg | ORAL_TABLET | Freq: Every day | ORAL | Status: DC
  Administered 2015-09-21: 325 mg via ORAL
  Filled 2015-09-20: qty 1

## 2015-09-20 MED ORDER — PHENYLEPHRINE HCL 10 MG/ML IJ SOLN
INTRAMUSCULAR | Status: DC | PRN
  Administered 2015-09-20: 40 ug via INTRAVENOUS

## 2015-09-20 MED ORDER — OXYCODONE HCL 5 MG PO TABS
ORAL_TABLET | ORAL | Status: AC
  Filled 2015-09-20: qty 2

## 2015-09-20 MED ORDER — OXYCODONE HCL 5 MG/5ML PO SOLN: 5.0000 mg | Freq: Once | ORAL | Status: AC | PRN

## 2015-09-20 SURGICAL SUPPLY — 88 items
ANCH SUT 2 CRKSRW FT 14X4.5 (Anchor) ×1 IMPLANT
ANCH SUT PUSHLCK 24X4.5 STRL (Orthopedic Implant) ×1 IMPLANT
ANCHOR PEEK CORKSCREW 4.5 (Anchor) ×1 IMPLANT
ANCHOR PEEK CORKSCREW 4.5MM (Anchor) ×1 IMPLANT
AUGMENT TIBIAL SZ 5 LEFT (Knees) IMPLANT
BANDAGE ELASTIC 6 VELCRO ST LF (GAUZE/BANDAGES/DRESSINGS) ×1 IMPLANT
BANDAGE ESMARK 6X9 LF (GAUZE/BANDAGES/DRESSINGS) ×1 IMPLANT
BLADE SAG 18X100X1.27 (BLADE) ×3 IMPLANT
BLADE SAGITTAL 25.0X1.27X90 (BLADE) ×2 IMPLANT
BLADE SAGITTAL 25.0X1.27X90MM (BLADE) ×1
BLADE SURG ROTATE 9660 (MISCELLANEOUS) ×2 IMPLANT
BNDG CMPR 9X6 STRL LF SNTH (GAUZE/BANDAGES/DRESSINGS)
BNDG COHESIVE 6X5 TAN STRL LF (GAUZE/BANDAGES/DRESSINGS) ×4 IMPLANT
BNDG ESMARK 6X9 LF (GAUZE/BANDAGES/DRESSINGS)
BOWL SMART MIX CTS (DISPOSABLE) ×2 IMPLANT
BSPLAT TIB 5 CMNT REV F TPR KN (Knees) ×1 IMPLANT
CEMENT BONE SIMPLEX SPEEDSET (Cement) ×6 IMPLANT
COMP FEMORAL CONST SZ5 LT KNEE (Knees) ×3 IMPLANT
COMPONENT FEMRL CONST SZ5LT KN (Knees) IMPLANT
COVER SURGICAL LIGHT HANDLE (MISCELLANEOUS) ×3 IMPLANT
CUFF TOURNIQUET SINGLE 34IN LL (TOURNIQUET CUFF) ×2 IMPLANT
CUFF TOURNIQUET SINGLE 44IN (TOURNIQUET CUFF) IMPLANT
DRAPE IMP U-DRAPE 54X76 (DRAPES) ×3 IMPLANT
DRAPE ORTHO SPLIT 77X108 STRL (DRAPES) ×6
DRAPE SURG ORHT 6 SPLT 77X108 (DRAPES) ×2 IMPLANT
DRAPE U-SHAPE 47X51 STRL (DRAPES) ×3 IMPLANT
DRSG PAD ABDOMINAL 8X10 ST (GAUZE/BANDAGES/DRESSINGS) ×4 IMPLANT
DURAPREP 26ML APPLICATOR (WOUND CARE) ×3 IMPLANT
ELECT REM PT RETURN 9FT ADLT (ELECTROSURGICAL) ×3
ELECTRODE REM PT RTRN 9FT ADLT (ELECTROSURGICAL) ×1 IMPLANT
EVACUATOR 1/8 PVC DRAIN (DRAIN) IMPLANT
FACESHIELD STD STERILE (MASK) ×8 IMPLANT
GAUZE SPONGE 4X4 12PLY STRL (GAUZE/BANDAGES/DRESSINGS) ×3 IMPLANT
GAUZE XEROFORM 1X8 LF (GAUZE/BANDAGES/DRESSINGS) IMPLANT
GAUZE XEROFORM 5X9 LF (GAUZE/BANDAGES/DRESSINGS) ×2 IMPLANT
GLOVE BIO SURGEON STRL SZ8 (GLOVE) ×3 IMPLANT
GLOVE BIOGEL PI IND STRL 8 (GLOVE) ×2 IMPLANT
GLOVE BIOGEL PI INDICATOR 8 (GLOVE) ×6
GLOVE ORTHO TXT STRL SZ7.5 (GLOVE) ×3 IMPLANT
GLOVE SURG SS PI 8.0 STRL IVOR (GLOVE) ×3 IMPLANT
GOWN STRL REUS W/ TWL LRG LVL3 (GOWN DISPOSABLE) ×3 IMPLANT
GOWN STRL REUS W/ TWL XL LVL3 (GOWN DISPOSABLE) ×2 IMPLANT
GOWN STRL REUS W/TWL LRG LVL3 (GOWN DISPOSABLE) ×3
GOWN STRL REUS W/TWL XL LVL3 (GOWN DISPOSABLE) ×9
HANDPIECE INTERPULSE COAX TIP (DISPOSABLE) ×3
IMMOBILIZER KNEE 22 UNIV (SOFTGOODS) ×1 IMPLANT
INSERT GENESIS CONS ART15MM5-6 (Insert) ×2 IMPLANT
KIT BASIN OR (CUSTOM PROCEDURE TRAY) ×3 IMPLANT
KIT ROOM TURNOVER OR (KITS) ×3 IMPLANT
MANIFOLD NEPTUNE II (INSTRUMENTS) ×3 IMPLANT
NDL SUT 6 .5 CRC .975X.05 MAYO (NEEDLE) IMPLANT
NEEDLE MAYO TAPER (NEEDLE) ×3
NS IRRIG 1000ML POUR BTL (IV SOLUTION) ×3 IMPLANT
PACK TOTAL JOINT (CUSTOM PROCEDURE TRAY) ×3 IMPLANT
PACK UNIVERSAL I (CUSTOM PROCEDURE TRAY) ×3 IMPLANT
PAD ARMBOARD 7.5X6 YLW CONV (MISCELLANEOUS) ×4 IMPLANT
PADDING CAST ABS 6INX4YD NS (CAST SUPPLIES) ×2
PADDING CAST ABS COTTON 6X4 NS (CAST SUPPLIES) ×1 IMPLANT
PADDING CAST COTTON 6X4 STRL (CAST SUPPLIES) IMPLANT
PATELLA RESURF 35MM (Knees) ×2 IMPLANT
PIN TROCAR 3 INCH (PIN) ×2 IMPLANT
PIN TROCAR 5 (PIN) ×3 IMPLANT
PUSHLOCK PEEK 4.5X24 (Orthopedic Implant) ×2 IMPLANT
RASP HELIOCORDIAL MED (MISCELLANEOUS) IMPLANT
SET HNDPC FAN SPRY TIP SCT (DISPOSABLE) IMPLANT
SET PAD KNEE POSITIONER (MISCELLANEOUS) ×6 IMPLANT
SPONGE GAUZE 4X4 12PLY STER LF (GAUZE/BANDAGES/DRESSINGS) ×2 IMPLANT
SPONGE LAP 18X18 X RAY DECT (DISPOSABLE) ×2 IMPLANT
STAPLER VISISTAT 35W (STAPLE) ×3 IMPLANT
STEM STRAIGHT 12X160MM (Stem) ×3 IMPLANT
STEM STRAIGHT 14MX120M KNEE (Stem) ×2 IMPLANT
SUCTION FRAZIER HANDLE 10FR (MISCELLANEOUS) ×2
SUCTION TUBE FRAZIER 10FR DISP (MISCELLANEOUS) ×1 IMPLANT
SUT ETHIBOND NAB CT1 #1 30IN (SUTURE) ×2 IMPLANT
SUT VIC AB 0 CT1 27 (SUTURE) ×6
SUT VIC AB 0 CT1 27XBRD ANBCTR (SUTURE) ×2 IMPLANT
SUT VIC AB 1 CT1 27 (SUTURE) ×9
SUT VIC AB 1 CT1 27XBRD ANBCTR (SUTURE) ×2 IMPLANT
SUT VIC AB 2-0 CT1 27 (SUTURE) ×6
SUT VIC AB 2-0 CT1 TAPERPNT 27 (SUTURE) ×2 IMPLANT
SWAB COLLECTION DEVICE MRSA (MISCELLANEOUS) IMPLANT
TIBIA SZ 5 LEFT (Knees) ×3 IMPLANT
TOWEL OR 17X24 6PK STRL BLUE (TOWEL DISPOSABLE) ×3 IMPLANT
TOWEL OR 17X26 10 PK STRL BLUE (TOWEL DISPOSABLE) ×3 IMPLANT
TRAY FOLEY CATH 16FRSI W/METER (SET/KITS/TRAYS/PACK) IMPLANT
TUBE ANAEROBIC SPECIMEN COL (MISCELLANEOUS) ×1 IMPLANT
WATER STERILE IRR 1000ML POUR (IV SOLUTION) ×3 IMPLANT
WRAP KNEE MAXI GEL POST OP (GAUZE/BANDAGES/DRESSINGS) ×1 IMPLANT

## 2015-09-20 NOTE — Brief Op Note (Signed)
09/20/2015  3:03 PM  PATIENT:  [name]  60 y.o. male  PRE-OPERATIVE DIAGNOSIS:  history of excision arthroplasty left total knee  POST-OPERATIVE DIAGNOSIS:  history of excision arthroplasty left total knee  PROCEDURE:  Procedure(s): RE-IMPLANT/REVISION LEFT TOTAL KNEE ARTHROPLASTY (Left)  SURGEON:  Surgeon(s) and Role:    * Kathryne Hitch, MD - Primary  PHYSICIAN ASSISTANT: Rexene Edison, PA-C  ANESTHESIA:   regional and general  EBL:  Total I/O In: 1000 [I.V.:1000] Out: -   COUNTS:  YES  DICTATION: .Other Dictation: Dictation Number [id number]  PLAN OF CARE: Admit to inpatient   PATIENT DISPOSITION:  PACU - hemodynamically stable.   Delay start of Pharmacological VTE agent (>24hrs) due to surgical blood loss or risk of bleeding: no

## 2015-09-20 NOTE — Anesthesia Preprocedure Evaluation (Signed)
Anesthesia Evaluation  Patient identified by MRN, date of birth, ID band Patient awake    Reviewed: Allergy & Precautions, NPO status , Patient's Chart, lab work & pertinent test results  Airway Mallampati: II  TM Distance: >3 FB Neck ROM: Full    Dental no notable dental hx.    Pulmonary neg pulmonary ROS,    Pulmonary exam normal breath sounds clear to auscultation       Cardiovascular Exercise Tolerance: Good Normal cardiovascular exam Rhythm:Regular Rate:Normal     Neuro/Psych  Headaches, PSYCHIATRIC DISORDERS Anxiety Depression    GI/Hepatic Neg liver ROS, GERD  Medicated,  Endo/Other  negative endocrine ROS  Renal/GU negative Renal ROS  negative genitourinary   Musculoskeletal  (+) Arthritis , Rheumatoid disorders,    Abdominal   Peds negative pediatric ROS (+)  Hematology negative hematology ROS (+)   Anesthesia Other Findings   Reproductive/Obstetrics negative OB ROS                             Anesthesia Physical  Anesthesia Plan  ASA: II  Anesthesia Plan: General   Post-op Pain Management:    Induction: Intravenous  Airway Management Planned: Oral ETT  Additional Equipment:   Intra-op Plan:   Post-operative Plan: Extubation in OR  Informed Consent: I have reviewed the patients History and Physical, chart, labs and discussed the procedure including the risks, benefits and alternatives for the proposed anesthesia with the patient or authorized representative who has indicated his/her understanding and acceptance.   Dental advisory given  Plan Discussed with: CRNA  Anesthesia Plan Comments: (H/O Extensive lumbar fusion L2-5. Plan general with adductor canal block.   Discussed risks of adductor canal block including failure, bleeding, infection, nerve damage.  Adductor canal block does not usually prevent all pain. Specifically, it treats the anterior, but often  not the posterior knee. Questions answered.  Patient consents to block.  Neck precautions with Rheumatoid arthritis history.)        Anesthesia Quick Evaluation

## 2015-09-20 NOTE — Progress Notes (Addendum)
While starting the IV,Pt states he feels bad. Sweat noted to his forehead and cold to touch. Heart rate 57 Bp 82/33. Placed on tele to monitor. Placed in trendelenburg, o2 applied. Dr Gentry Roch called and informed.

## 2015-09-20 NOTE — Anesthesia Procedure Notes (Addendum)
Anesthesia Regional Block:  Adductor canal block  Pre-Anesthetic Checklist: ,, timeout performed, Correct Patient, Correct Site, Correct Laterality, Correct Procedure, Correct Position, site marked, Risks and benefits discussed,  Surgical consent,  Pre-op evaluation,  At surgeon's request and post-op pain management  Laterality: Left  Prep: chloraprep       Needles:  Injection technique: Single-shot  Needle Type: Echogenic Stimulator Needle     Needle Length: 9cm 9 cm Needle Gauge: 21 and 21 G    Additional Needles:  Procedures: ultrasound guided (picture in chart) Adductor canal block Narrative:  Injection made incrementally with aspirations every 5 mL.  Performed by: Personally  Anesthesiologist: JUDD, BENJAMIN  Additional Notes: Risks, benefits and alternative to block explained extensively.  Patient tolerated procedure well, without complications.   Procedure Name: Intubation Date/Time: 09/20/2015 12:56 PM Performed by: Virgel Gess LEFFEW Pre-anesthesia Checklist: Patient identified, Patient being monitored, Timeout performed, Emergency Drugs available and Suction available Patient Re-evaluated:Patient Re-evaluated prior to inductionOxygen Delivery Method: Circle System Utilized Preoxygenation: Pre-oxygenation with 100% oxygen Intubation Type: IV induction Ventilation: Mask ventilation without difficulty Laryngoscope Size: Mac and 4 Grade View: Grade I Tube type: Oral Tube size: 7.5 mm Number of attempts: 1 Airway Equipment and Method: Stylet Placement Confirmation: ETT inserted through vocal cords under direct vision,  positive ETCO2 and breath sounds checked- equal and bilateral Secured at: 23 cm Tube secured with: Tape Dental Injury: Teeth and Oropharynx as per pre-operative assessment

## 2015-09-20 NOTE — Transfer of Care (Signed)
Immediate Anesthesia Transfer of Care Note  Patient: [name]  Procedure(s) Performed: Procedure(s): RE-IMPLANT/REVISION LEFT TOTAL KNEE ARTHROPLASTY (Left)  Patient Location: PACU  Anesthesia Type:GA combined with regional for post-op pain  Level of Consciousness: awake, alert , oriented and patient cooperative  Airway & Oxygen Therapy: Patient Spontanous Breathing and Patient connected to nasal cannula oxygen  Post-op Assessment: Report given to RN and Post -op Vital signs reviewed and stable  Post vital signs: Reviewed and stable  Last Vitals:  Filed Vitals:   09/20/15 1220 09/20/15 1522  BP:  162/84  Pulse: 73 104  Temp:  36.6 C  Resp: 15 12    Complications: No apparent anesthesia complications

## 2015-09-20 NOTE — H&P (Signed)
[name] is an 60 y.o. male.   Chief Complaint:   Left total knee joint infection HPI:   60 yo male with rheumatoid disease who presents for a re-implantation of a left total knee replacement following an excision arthroplasty of his left total knee, placement of an antibiotic spacer, and 6 weeks of IV vancomycin.  He originally had his left knee replacement in May 2016 and did well.  He then presented with acute knee swelling several months later after some type of mouth infection.  He underwent an I&d and poly exchange followed by a repeat arthroscopic synovectomy.  He never did grow out any organism, but continued to have recurrent effusions and a high WBC count from an aspiration of his knee.  A excision was then performed in December 2016.  He now presents for his left knee revision.  His WBC from the knee has come down from 31,000 to just 3,000.  No organisms.  His CR-P and ESR have almost normalized at this point.  Past Medical History  Diagnosis Date  . OCD (obsessive compulsive disorder)   . Anxiety   . History of kidney stones     x1 15 years ago  . Restless leg syndrome     takes requip daily  . Cataract     left eye  . Headache     hx of migraines-none recent  . Rheumatoid arthritis(714.0)     sees Dr Gerilyn Nestle @ [facility] in [place]  . Depression     takes Cymbalta daily  . ADD (attention deficit disorder)     takes Ritalin daily  . GERD (gastroesophageal reflux disease)     takes Omeprazole daily  . Enlarged prostate     takes Flomax daily   . Numbness and tingling     fingers bilat comes and goes   . Wears glasses   . History of blood transfusion   . Kidney stones   . Anemia     Past Surgical History  Procedure Laterality Date  . Total hip arthroplasty Right 01/2012    done in [facility]  . Hand tendon surgery Right 06/2012    done @ [facility] in [place]  . Rotator cuff repair Right 2008     done in [facility]  . Anterior lumbar fusion  09/13/2012     Procedure: ANTERIOR LUMBAR FUSION 1 LEVEL;  Surgeon: Faythe Ghee, MD;  Location: [facility];  Service: Neurosurgery;  Laterality: N/A;  lumbar four-five  . Anterior lat lumbar fusion  09/13/2012    Procedure: ANTERIOR LATERAL LUMBAR FUSION 2 LEVELS;  Surgeon: Faythe Ghee, MD;  Location: [facility];  Service: Neurosurgery;  Laterality: Right;  lumbar two three,three-four  . Lumbar percutaneous pedicle screw 2 level  09/13/2012    Procedure: LUMBAR PERCUTANEOUS PEDICLE SCREW 2 LEVEL;  Surgeon: Faythe Ghee, MD;  Location: [facility];  Service: Neurosurgery;  Laterality: Left;  left pedicle screws two -three,three-four  . Abdominal exposure  09/13/2012    Procedure: ABDOMINAL EXPOSURE;  Surgeon: Rosetta Posner, MD;  Location: [facility] NEURO ORS;  Service: Vascular;  Laterality: N/A;  . Total knee arthroplasty Left 01/08/2015    Procedure: LEFT TOTAL KNEE ARTHROPLASTY;  Surgeon: Mcarthur Rossetti, MD;  Location: [facility] ORS;  Service: Orthopedics;  Laterality: Left;  . I&d knee with poly exchange Left 05/07/2015    Procedure: IRRIGATION AND DEBRIDEMENT LEFT KNEE WITH POLY EXCHANGE;  Surgeon: Mcarthur Rossetti, MD;  Location: [facility];  Service: Orthopedics;  Laterality: Left;  . Knee arthroscopy Left 06/22/2015    Procedure: LEFT KNEE ARTHROSCOPY WITH DEBRIDEMENT, SYNOVECTOMY;  Surgeon: Mcarthur Rossetti, MD;  Location: [facility];  Service: Orthopedics;  Laterality: Left;  . Excisional total knee arthroplasty with antibiotic spacers Left 08/06/2015    Procedure: EXCISION OF LEFT TOTAL KNEE ARTHROPLASTY WITH PLACEMENT OF ANTIBIOTIC SPACERS;  Surgeon: Mcarthur Rossetti, MD;  Location: [facility]S;  Service: Orthopedics;  Laterality: Left;    Family History  Problem Relation Age of Onset  . CAD Father   . Alzheimer's disease Father    Social History:  reports that he has never smoked. He has never used smokeless tobacco. He reports that he drinks alcohol. He reports that he does not use illicit  drugs.  Allergies:  Allergies  Allergen Reactions  . Penicillins Rash    Has patient had a PCN reaction causing immediate rash, facial/tongue/throat swelling, SOB or lightheadedness with hypotension: No Has patient had a PCN reaction causing severe rash involving mucus membranes or skin necrosis: No- around scars.  Has patient had a PCN reaction that required hospitalization No Has patient had a PCN reaction occurring within the last 10 years: No If all of the above answers are "NO", then may proceed with Cephalosporin use.     Medications Prior to Admission  Medication Sig Dispense Refill  . DULoxetine (CYMBALTA) 60 MG capsule Take 60 mg by mouth every morning.    . ferrous sulfate 325 (65 FE) MG tablet Take 325 mg by mouth daily with breakfast.    . folic acid (FOLVITE) 1 MG tablet Take 1 mg by mouth at bedtime.     Marland Kitchen leflunomide (ARAVA) 10 MG tablet Take 10 mg by mouth at bedtime.    . methotrexate (RHEUMATREX) 2.5 MG tablet Take 12.5 mg by mouth every 7 (seven) days. Sunday    . methylphenidate (RITALIN) 20 MG tablet Take 10 mg by mouth 2 (two) times daily with breakfast and lunch.     Marland Kitchen omeprazole (PRILOSEC) 20 MG capsule Take 20 mg by mouth 2 (two) times daily.    Marland Kitchen oxyCODONE (OXY IR/ROXICODONE) 5 MG immediate release tablet Take 1-2 tablets (5-10 mg total) by mouth every 6 (six) hours as needed for severe pain. 60 tablet 0  . pramipexole (MIRAPEX) 1 MG tablet Take 1 mg by mouth 3 (three) times daily.    . rivaroxaban (XARELTO) 10 MG TABS tablet Take 1 tablet (10 mg total) by mouth daily with breakfast. 20 tablet 0  . tamsulosin (FLOMAX) 0.4 MG CAPS capsule Take 0.4 mg by mouth daily after breakfast.     . vancomycin 1,500 mg in sodium chloride 0.9 % 500 mL Inject 1,500 mg into the vein every 12 (twelve) hours. (Patient taking differently: Inject 1,500 mg into the vein 3 (three) times daily. ) 1500 mg 0    Results for orders placed or performed during the hospital encounter of  09/20/15 (from the past 48 hour(s))  Type and screen All Cardiac and thoracic surgeries, spinal fusions, myomectomies, craniotomies, colon & liver resections, total joint revisions, same day c-section with placenta previa or accreta.     Status: None   Collection Time: 09/20/15 10:20 AM  Result Value Ref Range   ABO/RH(D) A POS    Antibody Screen NEG    Sample Expiration 09/23/2015   Protime-INR     Status: None   Collection Time: 09/20/15 10:40 AM  Result Value Ref Range  Prothrombin Time 14.6 11.6 - 15.2 seconds   INR 1.13 0.00 - 1.49   No results found.  Review of Systems  All other systems reviewed and are negative.   Blood pressure 119/67, pulse 76, temperature 97.1 F (36.2 C), temperature source Oral, resp. rate 11, height 5' 7"  (1.702 m), weight 74.39 kg (164 lb), SpO2 100 %. Physical Exam  Constitutional: He is oriented to person, place, and time. He appears well-developed and well-nourished.  HENT:  Head: Normocephalic and atraumatic.  Eyes: EOM are normal. Pupils are equal, round, and reactive to light.  Neck: Normal range of motion. Neck supple.  Cardiovascular: Normal rate and regular rhythm.   Respiratory: Effort normal and breath sounds normal.  GI: Soft. Bowel sounds are normal.  Musculoskeletal:       Left knee: He exhibits swelling.  Neurological: He is alert and oriented to person, place, and time.  Skin: Skin is warm and dry.  Psychiatric: He has a normal mood and affect.     Assessment/Plan Status-post excision arthroplasty of left total knee and course of 6 weeks of IV antibiotics presenting for second stage of two-stage left knee revision arthroplasty 1)  Will return to the OR today for removal of the antibiotic space from his left knee and conversion to a revision total knee replacement.  Mcarthur Rossetti, MD 09/20/2015, 12:11 PM

## 2015-09-20 NOTE — Progress Notes (Addendum)
Dr Gentry Roch into see the pt. BP now 122/67. Pt states he feels better now.

## 2015-09-21 ENCOUNTER — Encounter (HOSPITAL_COMMUNITY): Payer: Self-pay | Admitting: Orthopaedic Surgery

## 2015-09-21 LAB — CBC
HCT: 27.3 % — ABNORMAL LOW (ref 39.0–52.0)
HEMOGLOBIN: 8.7 g/dL — AB (ref 13.0–17.0)
MCH: 27.9 pg (ref 26.0–34.0)
MCHC: 31.9 g/dL (ref 30.0–36.0)
MCV: 87.5 fL (ref 78.0–100.0)
Platelets: 258 10*3/uL (ref 150–400)
RBC: 3.12 MIL/uL — AB (ref 4.22–5.81)
RDW: 15 % (ref 11.5–15.5)
WBC: 8.1 10*3/uL (ref 4.0–10.5)

## 2015-09-21 LAB — BASIC METABOLIC PANEL
ANION GAP: 11 (ref 5–15)
BUN: 19 mg/dL (ref 6–20)
CHLORIDE: 98 mmol/L — AB (ref 101–111)
CO2: 25 mmol/L (ref 22–32)
Calcium: 8.3 mg/dL — ABNORMAL LOW (ref 8.9–10.3)
Creatinine, Ser: 0.95 mg/dL (ref 0.61–1.24)
GFR calc Af Amer: >60 mL/min (ref >60)
GFR calc non Af Amer: >60 mL/min (ref >60)
GLUCOSE: 144 mg/dL — AB (ref 65–99)
POTASSIUM: 4 mmol/L (ref 3.5–5.1)
Sodium: 134 mmol/L — ABNORMAL LOW (ref 135–145)

## 2015-09-21 MED ORDER — FERROUS SULFATE 325 (65 FE) MG PO TABS
325.0000 mg | ORAL_TABLET | Freq: Every day | ORAL | Status: DC
  Administered 2015-09-22 – 2015-09-23 (×2): 325 mg via ORAL
  Filled 2015-09-21 (×2): qty 1

## 2015-09-21 MED ORDER — DOXYCYCLINE HYCLATE 100 MG PO TABS
100.0000 mg | ORAL_TABLET | Freq: Two times a day (BID) | ORAL | Status: DC
  Administered 2015-09-21 – 2015-09-23 (×5): 100 mg via ORAL
  Filled 2015-09-21 (×4): qty 1

## 2015-09-21 MED ORDER — KETOROLAC TROMETHAMINE 15 MG/ML IJ SOLN
15.0000 mg | Freq: Four times a day (QID) | INTRAMUSCULAR | Status: DC
  Administered 2015-09-21 – 2015-09-23 (×10): 15 mg via INTRAVENOUS
  Filled 2015-09-21 (×9): qty 1

## 2015-09-21 NOTE — Progress Notes (Signed)
Subjective: 1 Day Post-Op Procedure(s) (LRB): RE-IMPLANT/REVISION LEFT TOTAL KNEE ARTHROPLASTY (Left) Patient reports pain as moderate.  Acute blood loss anemia.  Objective: Vital signs in last 24 hours: Temp:  [97.1 F (36.2 C)-98.8 F (37.1 C)] 98.8 F (37.1 C) (02/07 0430) Pulse Rate:  [57-110] 108 (02/07 0430) Resp:  [9-26] 16 (02/06 2300) BP: (82-162)/(33-87) 149/69 mmHg (02/07 0430) SpO2:  [91 %-100 %] 91 % (02/07 0500) Weight:  [74.39 kg (164 lb)] 74.39 kg (164 lb) (02/06 1036)  Intake/Output from previous day: 02/06 0701 - 02/07 0700 In: 1765 [P.O.:240; I.V.:1525] Out: 760 [Urine:760] Intake/Output this shift:     Recent Labs  09/21/15 0234  HGB 8.7*    Recent Labs  09/21/15 0234  WBC 8.1  RBC 3.12*  HCT 27.3*  PLT 258    Recent Labs  09/21/15 0234  NA 134*  K 4.0  CL 98*  CO2 25  BUN 19  CREATININE 0.95  GLUCOSE 144*  CALCIUM 8.3*    Recent Labs  09/20/15 1040  INR 1.13    Sensation intact distally Intact pulses distally Dorsiflexion/Plantar flexion intact Incision: moderate drainage Compartment soft  Assessment/Plan: 1 Day Post-Op Procedure(s) (LRB): RE-IMPLANT/REVISION LEFT TOTAL KNEE ARTHROPLASTY (Left) Up with therapy  Charitie Hinote Y 09/21/2015, 7:28 AM

## 2015-09-21 NOTE — Progress Notes (Signed)
Physical Therapy Treatment Patient Details Name: [name] MRN: [id number] DOB: [birth year] Today's Date: 09/21/2015    History of Present Illness Pt is a 60 y.o. male admitted on 09/20/15 for a L knee spacer removal and TKA revision. Pt has other significant PMH of OCD, anxiety, restless leg syndrome, RA, ADD, GERD, anemia, R THA (01/2012), R rotator cuff repair (2008), anterior lumbar fusion (08/2012), L TKA (12/2014), I&D L knee with poly exchange (04/2015), excisional TKA (07/2015).    PT Comments    Pt is agreeable and works hard in PT. Pt requires minimal verbal cues for hand placement with bed mobility. Pt requires min assist to transfer sit to stand and min guard for safety when ambulating. Pt ambulated approx 35 ft, and by the end was reporting an increase in pain and feeling fatigued. Pt has stairs in his home, and will benefit from continued PT including stair training.   Follow Up Recommendations  Home health PT     Equipment Recommendations  None recommended by PT       Precautions / Restrictions Precautions Precautions: Fall Required Braces or Orthoses: Knee Immobilizer - Left Knee Immobilizer - Left: On when out of bed or walking Restrictions Weight Bearing Restrictions: Yes LLE Weight Bearing: Weight bearing as tolerated    Mobility  Bed Mobility Overal bed mobility: Needs Assistance Bed Mobility: Sit to Supine       Sit to supine: Min assist;HOB elevated   General bed mobility comments: Pt requires verbal cues for hand placement, but pt demonstrates hooking R foot under L ankle to help move LLE. He reports he "learned" this from his previous L knee surgeries. Pt still requires min assist to help advance LLE in bed mobility.  Transfers Overall transfer level: Needs assistance Equipment used: Rolling walker (2 wheeled) Transfers: Sit to/from Stand Sit to Stand: Min assist         General transfer comment: Pt requires verbal cues for hand placement prior to  reaching for RW.  Ambulation/Gait Ambulation/Gait assistance: Min guard Ambulation Distance (Feet): 35 Feet Assistive device: Rolling walker (2 wheeled) Gait Pattern/deviations: Step-to pattern;Antalgic;Decreased weight shift to left Gait velocity: decreased  Gait velocity interpretation: Below normal speed for age/gender General Gait Details: Pt reported it "feels good to move" while ambulating. At end of walk, pt reported an increase in pain, but his biggest concern was how fatigued his LLE felt. Pt requires verbal cues to walk within the BOS of the RW.          Balance Overall balance assessment: Needs assistance Sitting-balance support: No upper extremity supported;Feet supported Sitting balance-Leahy Scale: Fair     Standing balance support: Bilateral upper extremity supported Standing balance-Leahy Scale: Poor Standing balance comment: Requires bilateral UE support on RW                    Cognition Arousal/Alertness: Awake/alert Behavior During Therapy: WFL for tasks assessed/performed Overall Cognitive Status: Within Functional Limits for tasks assessed                      Exercises Total Joint Exercises Ankle Circles/Pumps: AROM;Both;20 reps Quad Sets: AROM;Left;10 reps Towel Squeeze: AROM;Both;10 reps Short Arc QuadBarbaraann Boys;Left;10 reps Heel Slides: AAROM;Left;10 reps Hip ABduction/ADduction: AAROM;Left;10 reps Straight Leg Raises: AAROM;Left;10 reps        Pertinent Vitals/Pain Pain Assessment: 0-10 Pain Score: 4  Pain Location: L knee Pain Descriptors / Indicators: Aching;Cramping Pain Intervention(s): Limited activity within patient's tolerance;Monitored during  session;Repositioned           PT Goals (current goals can now be found in the care plan section) Acute Rehab PT Goals Patient Stated Goal: go home and be able to walk  PT Goal Formulation: With patient Time For Goal Achievement: 10/05/15 Potential to Achieve Goals:  Good Progress towards PT goals: Progressing toward goals    Frequency  7X/week    PT Plan Current plan remains appropriate       End of Session Equipment Utilized During Treatment: Gait belt Activity Tolerance: Patient limited by pain;Patient limited by fatigue Patient left: in bed;with call bell/phone within reach     Time: 9417-4081 PT Time Calculation (min) (ACUTE ONLY): 35 min  Charges:    1 gait, 1 therapeutic exercise                     [place], [place] [phone] office Chase Picket 09/21/2015, 4:57 PM

## 2015-09-21 NOTE — Evaluation (Signed)
Physical Therapy Evaluation Patient Details Name: [name] MRN: [id number] DOB: 1955-08-21 Today's Date: 09/21/2015   History of Present Illness  Pt is a 60 y.o. male admitted on 09/20/15 for a L knee spacer removal and TKA revision. Pt has other significant PMH of OCD, anxiety, restless leg syndrome, RA, ADD, GERD, anemia, R THA (01/2012), R rotator cuff repair (2008), anterior lumbar fusion (08/2012), L TKA (12/2014), I&D L knee with poly exchange (04/2015), excisional TKA (07/2015).  Clinical Impression  Pt is agreeable and willing to work in PT. Pt knows the drill with PT as he has hx of knee sx. Pt requires min assist to mobilize LLE in bed mobility, and min assist to complete a sit to stand transfer and ambulate. Pt requires verbal cues to stay within the RW's base and for hand placement. Pt can stay on main level of home, but has stairs to get to his normal bedroom and would benefit from stair training.  PT to follow acutely for deficits listed below.       Follow Up Recommendations Home health PT    Equipment Recommendations  None recommended by PT       Precautions / Restrictions Precautions Precautions: Fall Required Braces or Orthoses: Knee Immobilizer - Left Knee Immobilizer - Left: On when out of bed or walking Restrictions Weight Bearing Restrictions: Yes LLE Weight Bearing: Weight bearing as tolerated      Mobility  Bed Mobility Overal bed mobility: Needs Assistance Bed Mobility: Supine to Sit     Supine to sit: Min assist;HOB elevated     General bed mobility comments: Pt requires verbal cues for hand placement, and requires min assist to advance LLE to EOB. Pt has had sx on L knee before, and demonstrated hooking his R foot under his L ankle to help advance the LLE. Pt still required min assist to get L leg to EOB.  Transfers Overall transfer level: Needs assistance Equipment used: Rolling walker (2 wheeled) Transfers: Sit to/from Stand Sit to Stand: Min  assist         General transfer comment: pt requires verbal cues to stand tall after completing transfer. Requires verbal cues for hand placement on EOB prior to reaching for RW. Pt requires reminders that he is WBAT on LLE.  Ambulation/Gait Ambulation/Gait assistance: Min assist Ambulation Distance (Feet): 15 Feet Assistive device: Rolling walker (2 wheeled) Gait Pattern/deviations: Step-to pattern;Antalgic;Decreased weight shift to left Gait velocity: decreased  Gait velocity interpretation: Below normal speed for age/gender General Gait Details: Pt demonstrated understanding of sequencing of RW and steps. Pt requires verbal reminders to stay within the base of the RW instead of letting it get out ahead of him.         Balance Overall balance assessment: Needs assistance Sitting-balance support: No upper extremity supported;Feet supported Sitting balance-Leahy Scale: Fair     Standing balance support: Bilateral upper extremity supported Standing balance-Leahy Scale: Poor Standing balance comment: Requires bilateral UE support on RW                              Pertinent Vitals/Pain Pain Assessment: 0-10 Pain Score: 3  Pain Location: L knee Pain Descriptors / Indicators: Aching Pain Intervention(s): Limited activity within patient's tolerance;Monitored during session;Repositioned    Home Living Family/patient expects to be discharged to:: Private residence Living Arrangements: Spouse/significant other Available Help at Discharge: Available PRN/intermittently (works during the day) Type of Home: House Home Access:  Stairs to enter Entrance Stairs-Rails: None Entrance Stairs-Number of Steps: 2 Home Layout: Two level;Able to live on main level with bedroom/bathroom Home Equipment: Crutches;Walker - 2 wheels;Bedside commode;Grab bars - tub/shower      Prior Function Level of Independence: Independent         Comments: 6-weeks ago when the spacer was  placed, pt reports using RW in the house and crutches when out.  Following L TKA (12/2014) and prior to infection, pt was independent.     Hand Dominance   Dominant Hand: Right       Communication   Communication: No difficulties  Cognition Arousal/Alertness: Awake/alert Behavior During Therapy: WFL for tasks assessed/performed Overall Cognitive Status: Within Functional Limits for tasks assessed                         Exercises Total Joint Exercises Ankle Circles/Pumps: AROM;Both;20 reps Quad Sets: AAROM;Left;10 reps Towel Squeeze: AROM;Both;10 reps Heel Slides: AAROM;Left;10 reps Goniometric ROM: L knee flexion: 52 degrees; L knee extensino: lacking 10 degrees from full extension      Assessment/Plan    PT Assessment Patient needs continued PT services  PT Diagnosis Difficulty walking;Abnormality of gait;Acute pain   PT Problem List Decreased strength;Decreased range of motion;Decreased activity tolerance;Decreased balance;Decreased mobility;Decreased knowledge of use of DME;Pain  PT Treatment Interventions DME instruction;Gait training;Stair training;Functional mobility training;Therapeutic activities;Therapeutic exercise;Patient/family education;Balance training   PT Goals (Current goals can be found in the Care Plan section) Acute Rehab PT Goals Patient Stated Goal: go home and be able to walk  PT Goal Formulation: With patient Time For Goal Achievement: 10/05/15 Potential to Achieve Goals: Good    Frequency 7X/week           End of Session Equipment Utilized During Treatment: Gait belt Activity Tolerance: Patient limited by pain Patient left: in chair;with call bell/phone within reach           Time: 1003-1104 PT Time Calculation (min) (ACUTE ONLY): 61 min   Charges: 1 mod evaluation, 1 therapeutic activity, 1 therapeutic exercise, 1 gait             [place], [place] [phone] office  Chase Picket 09/21/2015, 12:13 PM

## 2015-09-21 NOTE — Progress Notes (Signed)
OT Cancellation Note  Patient Details Name: [name] MRN: [id number] DOB: 08-Nov-1955   Cancelled Treatment:    Reason Eval/Treat Not Completed:  (OT screened)  Spoke with pt and he has no OT concerns. OT signing off.  Earlie Raveling OTR/L [phone] 09/21/2015, 2:28 PM

## 2015-09-21 NOTE — Care Management (Signed)
Utilization review completed. Astrid Vides, RN Case Manager [phone]. 

## 2015-09-21 NOTE — Op Note (Signed)
[name], [name] NO.:  [id number]  MEDICAL RECORD NO.:  [id number]  LOCATION:  5N13C                        FACILITY:  [facility]  PHYSICIAN:  Vanita Panda. Magnus Ivan, M.D.DATE OF BIRTH:  27-Jan-1956  DATE OF PROCEDURE:  09/20/2015 DATE OF DISCHARGE:                              OPERATIVE REPORT   PREOPERATIVE DIAGNOSIS:  Previous questionable infected left total knee arthroplasty status post excision arthroplasty and placement of antibiotic spacer with being on 6 weeks of IV antibiotics.  POSTOPERATIVE DIAGNOSIS:  Previous questionable infected left total knee arthroplasty status post excision arthroplasty and placement of antibiotic spacer with being on 6 weeks of IV antibiotics.  PROCEDURE:  Removal of antibiotic spacer and reimplantation/second-stage revision arthroplasty, left knee.  IMPLANTS:  Smith and Nephew size 5 LEGION Oxinium femur size 5 with a 12 mm x 160 mm stem, size 5 LEGION revision tibial base plate with 14 mm x 120 mm stem, size 15 mm constrained polyethylene insert, size 35 mm patellar button.  SURGEON:  Vanita Panda. Magnus Ivan, MD  ASSISTANT:  Richardean Canal, PA-C  ANESTHESIA: 1. Left lower shin regional block. 2. General.  BLOOD LOSS:  200-250 mL.  ANTIBIOTICS:  900 mg of IV clindamycin.  COMPLICATIONS:  None.  INDICATIONS:  [name] is a 60 year old, well known to me.  In May of 2016, he underwent a successful primary total knee arthroplasty of his left knee.  He had done well for many months and then in the fall, developed some type of mouth infection, they felt it was maybe hand, foot, and mouth disease and sterile antibiotic.  We then stopped the antibiotic thinking it was more of a viral issue.  He then came to my office on Monday morning with a large joint effusion from his knee.  We drained effusion from his knee and found about 12,000 white blood cells and no organisms.  Even though he has rheumatoid disease, I felt  that it would be best to take him to the operating room for an open synovectomy, wash out of his knee and polyethylene exchange.  We also added him on IV antibiotics then.  He seemed to do well for another month, but then developed recurrent effusions again in his knee, so I took him for an arthroscopic washout and synovectomy arthroscopically.  That seem to not do well so in December.  We decided to take him for an excision arthroplasty.  In late December, we took out all components and placed an antibiotic cement spacer and put him on 6 weeks of IV antibiotics through the PICC line.  He is now presenting for reimplantation.  Of note, only 1 culture ever grew out rare Staph that was sensitive Staph, but that was only one culture of all the cultures he obtained while being off antibiotics.  His latest knee aspiration which showed 3000 white cells in the knee with no organisms.  His sed rate was almost normalized as well as a CRP which were both quite high before.  He now again presenting for revision arthroplasty.  He understands risks and benefits of this including the main risk of infection and blood clot and as well as blood loss.  PROCEDURE DESCRIPTION:  After informed consent was obtained, appropriate left knee was marked, he was brought to the operating room after an adductor canal block.  General anesthesia was then obtained.  A nonsterile tourniquet was placed around his upper left thigh.  His left leg was prepped and draped from the thigh down the ankle with DuraPrep and sterile drapes including sterile stockinette.  Time-out was called. He was identified as the correct patient and correct left knee.  We then used an Esmarch to wrap out the leg and tourniquet was inflated to 300 mm of pressure.  We then made a direct midline incision over the knee, carried it down knee joint.  We performed a medial parapatellar arthrotomy and found a minimal joint effusion.  We then moved  our cement, antibiotic spacer, and clean synovium from the knee and irrigated the knee with 3 L of normal saline solution using pulsatile lavage.  We then proceeded with the revision portion of the case.  We passed reamers in the femoral and tibial canal sequentially, so we could ream up for placing stems in the femur and the tibia.  Once this was done, we made our freshening cut on the tibia without difficulty.  We then trialed for a size 5 tibia with a 14 x 120 mm stem.  We were able to do our keel punch off this as well.  For the femur, we were able to go for the size 5 femur.  We made our 4-in-1, cuts off a 5 femur after doing a freshening distal femoral cut.  Once we got our trial components in place, we trialed up to a 15 mm constrained polyethylene insert and we felt like we had restored his joint line and his mobility.  We then freshened up our patellar cut as well and drilled 3 holes for patella button.  We then removed all trial components and irrigated the knee again with normal saline solution.  We were able to then mix our cement and cemented all of our real components including the size 5 femur with the 12 x 160 stem followed by the real tibia with the 14 x 120 stem.  We placed the real 50 mm fix bearing polyethylene insert and cemented the patellar button.  Once the cement had hardened, we removed all instrumentation and irrigated the knee again with 3 L normal saline solution.  We did feel like some of our extensor mechanism down the patella pulled away so we did place a temporary RA Arthrex suture anchor in the bone to a supplement sewing this back down to bone and I felt like we got a good repair of this including the arthrotomy repaired with #1 Ethibond and #1 Vicryl suture followed by 0 Vicryl in deep tissue, 2- 0 Vicryl subcutaneous tissue, and interrupted staples on the skin. Xeroform and well-padded sterile dressing was applied.  He was awakened, extubated, and  taken to the recovery room in stable condition.  All final counts were correct.  There were no complications noted.  Of note, Richardean Canal, PA-C assisted in the entire case.  His assistance was crucial for facilitating all aspects of this case.     Vanita Panda. Magnus Ivan, M.D.     CYB/MEDQ  D:  09/20/2015  T:  09/21/2015  Job:  [id number]

## 2015-09-21 NOTE — Discharge Instructions (Addendum)
Increase your activities as comfort allows. Work on bending you knee. Expect swelling. You can get your dressing wet daily in the shower. You can change your dressing in 6 days and place a new dressing.

## 2015-09-22 LAB — CBC
HEMATOCRIT: 23 % — AB (ref 39.0–52.0)
HEMOGLOBIN: 7.6 g/dL — AB (ref 13.0–17.0)
MCH: 28.8 pg (ref 26.0–34.0)
MCHC: 33 g/dL (ref 30.0–36.0)
MCV: 87.1 fL (ref 78.0–100.0)
Platelets: 199 10*3/uL (ref 150–400)
RBC: 2.64 MIL/uL — AB (ref 4.22–5.81)
RDW: 14.9 % (ref 11.5–15.5)
WBC: 7.2 10*3/uL (ref 4.0–10.5)

## 2015-09-22 MED ORDER — SENNA 8.6 MG PO TABS
2.0000 | ORAL_TABLET | Freq: Every day | ORAL | Status: DC | PRN
  Administered 2015-09-22: 17.2 mg via ORAL
  Filled 2015-09-22: qty 2

## 2015-09-22 NOTE — Progress Notes (Signed)
Physical Therapy Treatment Patient Details Name: [name] MRN: [id number] DOB: 07-Aug-1956 Today's Date: 09/22/2015    History of Present Illness Pt is a 60 y.o. male admitted on 09/20/15 for a L knee spacer removal and TKA revision. Pt has other significant PMH of OCD, anxiety, restless leg syndrome, RA, ADD, GERD, anemia, R THA (01/2012), R rotator cuff repair (2008), anterior lumbar fusion (08/2012), L TKA (12/2014), I&D L knee with poly exchange (04/2015), excisional TKA (07/2015).    PT Comments    Pt is agreeable and continues to work hard in PT. Pt ambulated 150 ft with a RW min guard for safety. Pt utilizes a step-to gait pattern, but in next tx will be encouraged to advance to step-through. Pt's hemoglobin and HCT lab values were low (7.6 and 23, respectively). Checked with RN prior to tx and pt denied any lightheadedness throughout tx. Overall pt tolerated tx well, and will benefit from continued PT and stair training prior to d/c home. Pt is planning for d/c home tomorrow AM.    Follow Up Recommendations  Home health PT     Equipment Recommendations  None recommended by PT       Precautions / Restrictions Precautions Precautions: Fall Required Braces or Orthoses: Knee Immobilizer - Left Knee Immobilizer - Left: On when out of bed or walking Restrictions Weight Bearing Restrictions: Yes LLE Weight Bearing: Weight bearing as tolerated    Mobility  Bed Mobility Overal bed mobility: Needs Assistance Bed Mobility: Supine to Sit     Supine to sit: Min guard     General bed mobility comments: Practiced bed mobility with flat HOB and without bedrails to simulate home environment. Pt requires min guard for safety. Pt effectively hooks his R foot underneath his L ankle to help advance his LLE to EOB.  Transfers Overall transfer level: Needs assistance Equipment used: Rolling walker (2 wheeled) Transfers: Sit to/from Stand Sit to Stand: Min assist         General  transfer comment: Pt requires min assist for safety and to help pt gain balance once standing. Once pt has his static standing balance, then he requires min guard for safety.  Ambulation/Gait Ambulation/Gait assistance: Min guard Ambulation Distance (Feet): 150 Feet Assistive device: Rolling walker (2 wheeled) Gait Pattern/deviations: Step-to pattern;Antalgic;Decreased weight shift to left Gait velocity: decreased  Gait velocity interpretation: Below normal speed for age/gender General Gait Details: Pt verbalized understanding that he is WBAT on LLE and pt understands he is to wear KI on L knee when up. Pt denies any large increase in pain or lightheadedness while ambulating.          Balance Overall balance assessment: Needs assistance Sitting-balance support: No upper extremity supported;Feet supported Sitting balance-Leahy Scale: Good     Standing balance support: Bilateral upper extremity supported Standing balance-Leahy Scale: Poor Standing balance comment: Requires bilateral UE support on RW                     Cognition Arousal/Alertness: Awake/alert Behavior During Therapy: WFL for tasks assessed/performed Overall Cognitive Status: Within Functional Limits for tasks assessed                      Exercises Total Joint Exercises Knee Flexion: AROM;AAROM;Left;10 reps;Seated (completed both AROM and AAROM knee flexion  ) Goniometric ROM: L knee flexion: 61 degrees; L knee extension: lacking 10 degrees from full extension    General Comments General comments (skin integrity, edema, etc.):  Pt's hemo and HCT levels were 7.6 and 23, respectively. Checked with RN prior to tx, and she reported he had been asymptomatic this AM and had been up in the bathroom. Pt denied any lightheadedness when asked throughout session.      Pertinent Vitals/Pain Pain Assessment: 0-10 Pain Score: 3  Pain Location: L knee  Pain Descriptors / Indicators: Aching Pain  Intervention(s): Limited activity within patient's tolerance;Monitored during session;Repositioned           PT Goals (current goals can now be found in the care plan section) Acute Rehab PT Goals Patient Stated Goal: go home and be able to walk  PT Goal Formulation: With patient Time For Goal Achievement: 10/05/15 Potential to Achieve Goals: Good Progress towards PT goals: Progressing toward goals    Frequency  7X/week    PT Plan Current plan remains appropriate       End of Session Equipment Utilized During Treatment: Gait belt Activity Tolerance: Patient tolerated treatment well Patient left: in chair;with call bell/phone within reach     Time: 1113-1146 PT Time Calculation (min) (ACUTE ONLY): 33 min  Charges:    1 therapeutic exercise, 1 gait                    [place], [place] [phone] office  Chase Picket 09/22/2015, 12:06 PM

## 2015-09-22 NOTE — Progress Notes (Signed)
Physical Therapy Treatment Patient Details Name: [name] MRN: [id number] DOB: 1955-12-26 Today's Date: 09/22/2015    History of Present Illness Pt is a 60 y.o. male admitted on 09/20/15 for a L knee spacer removal and TKA revision. Pt has other significant PMH of OCD, anxiety, restless leg syndrome, RA, ADD, GERD, anemia, R THA (01/2012), R rotator cuff repair (2008), anterior lumbar fusion (08/2012), L TKA (12/2014), I&D L knee with poly exchange (04/2015), excisional TKA (07/2015).    PT Comments    Pt requires min guard for safety in transferring from sit to stand. Pt requires supervision for safety in ambulation with minimal cues for sequencing as pt transitioned from a step-to gait pattern to a step-through gait pattern. Pt successfully completed stair training with a L rail to simulate home environment by both verbalizing and demonstrating correct technique. PT will continue to follow, and pt plans to d/c home in the AM on 09/23/15.   Follow Up Recommendations  Home health PT     Equipment Recommendations  None recommended by PT       Precautions / Restrictions Precautions Precautions: Fall Required Braces or Orthoses: Knee Immobilizer - Left Knee Immobilizer - Left: On when out of bed or walking Restrictions Weight Bearing Restrictions: Yes LLE Weight Bearing: Weight bearing as tolerated    Mobility  Bed Mobility Overal bed mobility:  (pt received and returned to recliner)                Transfers Overall transfer level: Needs assistance Equipment used: Rolling walker (2 wheeled) Transfers: Sit to/from Stand Sit to Stand: Min guard         General transfer comment: Pt requires min guard for safety, which is an improvement from the AM's tx. Pt requires reminders to slow down once standing, get his balance, check for any lightheadedness (especially with low hemo and HCT labs), then begin to move.  Ambulation/Gait Ambulation/Gait assistance:  Supervision Ambulation Distance (Feet): 220 Feet Assistive device: Rolling walker (2 wheeled) Gait Pattern/deviations: Step-to pattern;Step-through pattern;Antalgic Gait velocity: decreased  Gait velocity interpretation: Below normal speed for age/gender General Gait Details: Pt began with a step-to gait pattern, then on his own transistioned to step-through gait pattern. Pt then educated while ambulating on importance of continuing to use a step-through gait pattern. Pt requies minimal verbal cues for RW sequencing when utilizing a step-through gait pattern. Pt struggles with timing of advancing RW and often finds himself standing too far into the RW.   Stairs Stairs: Yes   Stair Management: One rail Left Number of Stairs: 5 General stair comments: Pt verbalized and demonstrated understanding of stair training. Pt utilized one rail on the left to simulate home environment, but pt reports he can reach the wall on the R side, too if he needed more support.          Balance Overall balance assessment: Needs assistance Sitting-balance support: No upper extremity supported;Feet supported Sitting balance-Leahy Scale: Good     Standing balance support: Bilateral upper extremity supported Standing balance-Leahy Scale: Poor Standing balance comment: Requires bilateral UE support on RW                     Cognition Arousal/Alertness: Awake/alert Behavior During Therapy: WFL for tasks assessed/performed Overall Cognitive Status: Within Functional Limits for tasks assessed                      Exercises Total Joint Exercises Long Arc Quad:  AAROM;Left;10 reps Knee Flexion: AROM;AAROM;Left;10 reps;Seated (completed both AROM and AAROM with RLE assisting )    General Comments        Pertinent Vitals/Pain Pain Assessment: 0-10 Pain Score: 4  Pain Location: L knee Pain Descriptors / Indicators: Aching Pain Intervention(s): Limited activity within patient's  tolerance;Monitored during session;Repositioned           PT Goals (current goals can now be found in the care plan section) Acute Rehab PT Goals Patient Stated Goal: go home and be able to walk  PT Goal Formulation: With patient Time For Goal Achievement: 10/05/15 Potential to Achieve Goals: Good Progress towards PT goals: Progressing toward goals    Frequency  7X/week    PT Plan Current plan remains appropriate       End of Session Equipment Utilized During Treatment: Gait belt Activity Tolerance: Patient tolerated treatment well Patient left: in chair;with call bell/phone within reach     Time: 8832-5498 PT Time Calculation (min) (ACUTE ONLY): 30 min  Charges:    1 gait, 1 therapeutic exercise                     [place], [place] [phone] office Chase Picket 09/22/2015, 4:59 PM

## 2015-09-22 NOTE — Progress Notes (Signed)
Patient ID: [name], male   DOB: Mar 02, 1956, 60 y.o.   MRN: [id number] Comfortable.  Less left knee swelling. Calf soft.  Labs not back yet.  Will discharge to home tomorrow.

## 2015-09-23 LAB — CBC
HCT: 20.8 % — ABNORMAL LOW (ref 39.0–52.0)
Hemoglobin: 7 g/dL — ABNORMAL LOW (ref 13.0–17.0)
MCH: 29.2 pg (ref 26.0–34.0)
MCHC: 33.7 g/dL (ref 30.0–36.0)
MCV: 86.7 fL (ref 78.0–100.0)
PLATELETS: 184 10*3/uL (ref 150–400)
RBC: 2.4 MIL/uL — AB (ref 4.22–5.81)
RDW: 14.6 % (ref 11.5–15.5)
WBC: 6 10*3/uL (ref 4.0–10.5)

## 2015-09-23 LAB — PREPARE RBC (CROSSMATCH)

## 2015-09-23 MED ORDER — ASPIRIN 325 MG PO TBEC: 325.0000 mg | DELAYED_RELEASE_TABLET | Freq: Two times a day (BID) | ORAL | Status: DC

## 2015-09-23 MED ORDER — SODIUM CHLORIDE 0.9 % IV SOLN
Freq: Once | INTRAVENOUS | Status: AC
  Administered 2015-09-23: 09:00:00 via INTRAVENOUS

## 2015-09-23 MED ORDER — ASPIRIN EC 325 MG PO TBEC
325.0000 mg | DELAYED_RELEASE_TABLET | Freq: Two times a day (BID) | ORAL | Status: DC
  Administered 2015-09-23: 325 mg via ORAL
  Filled 2015-09-23: qty 1

## 2015-09-23 MED ORDER — OXYCODONE HCL 5 MG PO TABS: 5.0000 mg | ORAL_TABLET | Freq: Four times a day (QID) | ORAL | Status: DC | PRN

## 2015-09-23 NOTE — Care Management Important Message (Signed)
Important Message  Patient Details  Name: [name] MRN: [id number] Date of Birth: [birth year]   Medicare Important Message Given:  Yes    Bernadette Hoit 09/23/2015, 8:32 AM

## 2015-09-23 NOTE — Discharge Summary (Signed)
Patient ID: [name] MRN: [id number] DOB/AGE: 1955-09-07 60 y.o.  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 OCD (obsessive compulsive disorder)   . Anxiety   . History of kidney stones     x1 15 years ago  . Restless leg syndrome     takes requip daily  . Cataract     left eye  . Headache     hx of migraines-none recent  . Rheumatoid arthritis(714.0)     sees Dr Sharmon Revere @ [facility] in [place]  . Depression     takes Cymbalta daily  . ADD (attention deficit disorder)     takes Ritalin daily  . GERD (gastroesophageal reflux disease)     takes Omeprazole daily  . Enlarged prostate     takes Flomax daily   . Numbness and tingling     fingers bilat comes and goes   . Wears glasses   . History of blood transfusion   . Kidney stones   . Anemia     Surgeries: Procedure(s): RE-IMPLANT/REVISION LEFT TOTAL KNEE ARTHROPLASTY on 09/20/2015   Consultants:    Discharged Condition: Improved  Hospital Course: [name] is an 60 y.o. male who was admitted 09/20/2015 for operative treatment ofAcquired absence of knee joint following explantation of joint prosthesis with presence of antibiotic-impregnated cement spacer. Patient has severe unremitting pain that affects sleep, daily activities, and work/hobbies. After pre-op clearance the patient was taken to the operating room on 09/20/2015 and underwent  Procedure(s): RE-IMPLANT/REVISION LEFT TOTAL KNEE ARTHROPLASTY.    Patient was given perioperative antibiotics: Anti-infectives    Start     Dose/Rate Route Frequency Ordered Stop    09/21/15 1000  doxycycline (VIBRA-TABS) tablet 100 mg     100 mg Oral Every 12 hours 09/21/15 0729     09/20/15 1900  clindamycin (CLEOCIN) IVPB 600 mg     600 mg 100 mL/hr over 30 Minutes Intravenous Every 6 hours 09/20/15 1720 09/21/15 0054   09/20/15 1200  clindamycin (CLEOCIN) IVPB 900 mg     900 mg 100 mL/hr over 30 Minutes Intravenous To [facility] 09/19/15 1438 09/20/15 1258       Patient was given sequential compression devices, early ambulation, and chemoprophylaxis to prevent DVT.  Patient benefited maximally from hospital stay and there were no complications.  He did require a transfusion due to symptomatic acute blood loss anemia.  Recent vital signs: Patient Vitals for the past 24 hrs:  BP Temp Temp src Pulse Resp SpO2  09/23/15 0500 108/60 mmHg 98.6 F (37 C) Oral 93 18 96 %  09/22/15 2034 134/64 mmHg 98.8 F (37.1 C) Oral (!) 104 18 99 %  09/22/15 1300 (!) 144/76 mmHg 98.1 F (36.7 C) Oral (!) 108 20 98 %  09/22/15 1005 127/60 mmHg 98.9 F (37.2 C) Oral (!) 102 16 95 %     Recent laboratory studies:  Recent Labs  09/20/15 1040  09/21/15 0234 09/22/15 0641 09/23/15 0718  WBC  --   < > 8.1 7.2 6.0  HGB  --   < > 8.7* 7.6* 7.0*  HCT  --   < >  27.3* 23.0* 20.8*  PLT  --   < > 258 199 184  NA  --   --  134*  --   --   K  --   --  4.0  --   --   CL  --   --  98*  --   --   CO2  --   --  25  --   --   BUN  --   --  19  --   --   CREATININE  --   --  0.95  --   --   GLUCOSE  --   --  144*  --   --   INR 1.13  --   --   --   --   CALCIUM  --   --  8.3*  --   --   < > = values in this interval not displayed.   Discharge Medications:     Medication List    STOP taking these medications        rivaroxaban 10 MG Tabs tablet  Commonly known as:  XARELTO     vancomycin 1,500 mg in sodium chloride 0.9 % 500 mL      TAKE these medications        aspirin 325 MG EC tablet  Take 1 tablet (325 mg total) by mouth 2 (two) times daily after a meal.      DULoxetine 60 MG capsule  Commonly known as:  CYMBALTA  Take 60 mg by mouth every morning.     ferrous sulfate 325 (65 FE) MG tablet  Take 325 mg by mouth daily with breakfast.     folic acid 1 MG tablet  Commonly known as:  FOLVITE  Take 1 mg by mouth at bedtime.     leflunomide 10 MG tablet  Commonly known as:  ARAVA  Take 10 mg by mouth at bedtime.     methotrexate 2.5 MG tablet  Commonly known as:  RHEUMATREX  Take 12.5 mg by mouth every 7 (seven) days. Sunday     methylphenidate 20 MG tablet  Commonly known as:  RITALIN  Take 10 mg by mouth 2 (two) times daily with breakfast and lunch.     omeprazole 20 MG capsule  Commonly known as:  PRILOSEC  Take 20 mg by mouth 2 (two) times daily.     oxyCODONE 5 MG immediate release tablet  Commonly known as:  Oxy IR/ROXICODONE  Take 1-2 tablets (5-10 mg total) by mouth every 6 (six) hours as needed for severe pain.     pramipexole 1 MG tablet  Commonly known as:  MIRAPEX  Take 1 mg by mouth 3 (three) times daily.     tamsulosin 0.4 MG Caps capsule  Commonly known as:  FLOMAX  Take 0.4 mg by mouth daily after breakfast.        Diagnostic Studies: Dg Knee Left Port  09/20/2015  CLINICAL DATA:  Post operative imaging following revision of a right knee arthroplasty. EXAM: PORTABLE LEFT KNEE - 1-2 VIEW COMPARISON:  01/08/2015 FINDINGS: Previous arthroplasty has been placed at. The femoral and tibial components demonstrate long intra medullary stems. The new prosthetic components are well-seated and aligned. There is no evidence of an acute fracture or operative complication. IMPRESSION: Well-aligned left knee prosthesis. Electronically Signed   By: Amie Portland M.D.   On: 09/20/2015 15:46    Disposition: 06-Home-Health Care Svc      Discharge Instructions  Call MD / Call 911    Complete by:  As directed   If you experience chest pain or shortness of breath, CALL 911 and be transported to the hospital emergency room.  If  you develope a fever above 101 F, pus (white drainage) or increased drainage or redness at the wound, or calf pain, call your surgeon's office.     Constipation Prevention    Complete by:  As directed   Drink plenty of fluids.  Prune juice may be helpful.  You may use a stool softener, such as Colace (over the counter) 100 mg twice a day.  Use MiraLax (over the counter) for constipation as needed.     Diet - low sodium heart healthy    Complete by:  As directed      Discharge patient    Complete by:  As directed      Increase activity slowly as tolerated    Complete by:  As directed               Signed: Kathryne Hitch 09/23/2015, 8:36 AM

## 2015-09-23 NOTE — Progress Notes (Signed)
Subjective: 3 Days Post-Op Procedure(s) (LRB): RE-IMPLANT/REVISION LEFT TOTAL KNEE ARTHROPLASTY (Left) Patient reports pain as moderate.  Symptomatic anemia.  Objective: Vital signs in last 24 hours: Temp:  [98.1 F (36.7 C)-98.9 F (37.2 C)] 98.6 F (37 C) (02/09 0500) Pulse Rate:  [93-108] 93 (02/09 0500) Resp:  [16-20] 18 (02/09 0500) BP: (108-144)/(60-76) 108/60 mmHg (02/09 0500) SpO2:  [95 %-99 %] 96 % (02/09 0500)  Intake/Output from previous day: 02/08 0701 - 02/09 0700 In: 640 [P.O.:640] Out: 1400 [Urine:1400] Intake/Output this shift:     Recent Labs  09/21/15 0234 09/22/15 0641 09/23/15 0718  HGB 8.7* 7.6* 7.0*    Recent Labs  09/22/15 0641 09/23/15 0718  WBC 7.2 6.0  RBC 2.64* 2.40*  HCT 23.0* 20.8*  PLT 199 184    Recent Labs  09/21/15 0234  NA 134*  K 4.0  CL 98*  CO2 25  BUN 19  CREATININE 0.95  GLUCOSE 144*  CALCIUM 8.3*    Recent Labs  09/20/15 1040  INR 1.13    Sensation intact distally Intact pulses distally Dorsiflexion/Plantar flexion intact Incision: scant drainage No cellulitis present Compartment soft  Assessment/Plan: 3 Days Post-Op Procedure(s) (LRB): RE-IMPLANT/REVISION LEFT TOTAL KNEE ARTHROPLASTY (Left) Up with therapy Discharge home with home health today after transfusion. Stop Xarelto, start aspirin  Cregg Jutte Y 09/23/2015, 8:37 AM

## 2015-09-23 NOTE — Progress Notes (Signed)
Physical Therapy Treatment Patient Details Name: [name] MRN: [id number] DOB: 01-15-1956 Today's Date: 09/23/2015    History of Present Illness Pt is a 60 y.o. male admitted on 09/20/15 for a L knee spacer removal and TKA revision. Pt has other significant PMH of OCD, anxiety, restless leg syndrome, RA, ADD, GERD, anemia, R THA (01/2012), R rotator cuff repair (2008), anterior lumbar fusion (08/2012), L TKA (12/2014), I&D L knee with poly exchange (04/2015), excisional TKA (07/2015).    PT Comments    Pt reported being asymptomatic, but his hemo and HCT values fell again today to 7.0 and 20.8, respectively. Pt scheduled to receive 2 units of blood this AM. Pt requires supervision for supine to sit and min assist for sit to supine to help raise LLE high enough to clear EOB. Pt no longer uses his RLE to help advance his LLE to EOB in moving supine to sit. PT will continue with OOB tx once blood products have been initiated.   Follow Up Recommendations  Home health PT     Equipment Recommendations  None recommended by PT       Precautions / Restrictions Precautions Precautions: Fall Required Braces or Orthoses: Knee Immobilizer - Left Knee Immobilizer - Left: On when out of bed or walking Restrictions Weight Bearing Restrictions: Yes LLE Weight Bearing: Weight bearing as tolerated    Mobility  Bed Mobility Overal bed mobility: Needs Assistance Bed Mobility: Supine to Sit     Supine to sit: Supervision;HOB elevated Sit to supine: Min assist;HOB elevated   General bed mobility comments: Pt demonstrated improvement in bed mobility this AM. Pt was hooking R foot under L ankle to advance LLE to and from EOB. When moving supine to sit, pt moved LLE without assistance from me or his R leg. Pt still requires min assist in moving sit to supine to raise LLE high enough to get it over EOB.   Transfers Overall transfer level:  (did not transfer OOB- see general comments)                          Balance Overall balance assessment: Needs assistance Sitting-balance support: No upper extremity supported;Feet supported Sitting balance-[name] Scale: Good                              Cognition Arousal/Alertness: Awake/alert Behavior During Therapy: WFL for tasks assessed/performed Overall Cognitive Status: Within Functional Limits for tasks assessed                      Exercises Total Joint Exercises Short Arc QuadBarbaraann Manning;Left;10 reps Heel Slides: AAROM;Left;10 reps Hip ABduction/ADduction: AAROM;Left;10 reps Straight Leg Raises: AAROM;Left;10 reps Goniometric ROM: L knee flexion: 72 degrees; L knee extension: lacking 6 degrees from full extension    General Comments General comments (skin integrity, edema, etc.): Pt's hemo and HCT values were 7.0 and 20.8, respectively. Pt was asymptomatic when completing bed mobility of supine to sit and sit to supine. Did not ambulate or transfer pt as he is scheduled to receive 2 units of blood this AM. Will continue to follow pt this PM for OOB tx.       Pertinent Vitals/Pain Pain Assessment: 0-10 Pain Score: 3  Pain Location: L knee Pain Descriptors / Indicators: Aching Pain Intervention(s): Limited activity within patient's tolerance;Monitored during session;Premedicated before session;Repositioned  PT Goals (current goals can now be found in the care plan section) Acute Rehab PT Goals Patient Stated Goal: go home and be able to walk  PT Goal Formulation: With patient Time For Goal Achievement: 10/05/15 Potential to Achieve Goals: Good Progress towards PT goals: Progressing toward goals    Frequency  7X/week    PT Plan Current plan remains appropriate       End of Session Equipment Utilized During Treatment: Gait belt Activity Tolerance: Patient tolerated treatment well Patient left: in bed;with call bell/phone within reach     Time: 0857-0913 PT Time Calculation (min)  (ACUTE ONLY): 16 min  Charges:    1 therapeutic exercise                     [place], [place] [phone] office Chase Picket 09/23/2015, 9:44 AM

## 2015-09-23 NOTE — Progress Notes (Signed)
Called Dr Eliberto Ivory office spoke with Morrie Sheldon she states Mr Hashimi is good to go.  Discharge papers reviewed with prescriptions

## 2015-09-23 NOTE — Progress Notes (Signed)
Nurse discontinued IV last evening without documentation, restarted this am for blood transfusion. Charlynn Grimes, RN

## 2015-09-23 NOTE — Progress Notes (Signed)
Physical Therapy Treatment Patient Details Name: [name] MRN: [id number] DOB: 1955-10-18 Today's Date: 09/23/2015    History of Present Illness Pt is a 60 y.o. male admitted on 09/20/15 for a L knee spacer removal and TKA revision. Pt has other significant PMH of OCD, anxiety, restless leg syndrome, RA, ADD, GERD, anemia, R THA (01/2012), R rotator cuff repair (2008), anterior lumbar fusion (08/2012), L TKA (12/2014), I&D L knee with poly exchange (04/2015), excisional TKA (07/2015).    PT Comments    Pt is mod I for bed mobility requiring extra time to complete mobility. Pt requires supervision for transferring from sit to stand and ambulation. Pt transitioned to a step through gait pattern and requires minimal verbal cues. Pt finished receiving her blood products during our tx (exercises in bed), and denied any lightheadedness throughout session. RN came in to check vitals, and cleared the pt to ambulate. Pt was asymptomatic throughout session, and plans to d/c home this afternoon.   Follow Up Recommendations  Home health PT     Equipment Recommendations  None recommended by PT       Precautions / Restrictions Precautions Precautions: Fall Required Braces or Orthoses: Knee Immobilizer - Left Knee Immobilizer - Left: On when out of bed or walking Restrictions Weight Bearing Restrictions: Yes LLE Weight Bearing: Weight bearing as tolerated    Mobility  Bed Mobility Overal bed mobility: Needs Assistance Bed Mobility: Supine to Sit     Supine to sit: Modified independent (Device/Increase time)     General bed mobility comments: Pt requires additional time to complete bed mobility due to being careful and slower when moving LLE, but overall is mod I.  Transfers Overall transfer level: Needs assistance Equipment used: Rolling walker (2 wheeled) Transfers: Sit to/from Stand Sit to Stand: Supervision         General transfer comment: Pt requires supervision for safety when  transferring from sit to stand. He utilizes proper hand placement without any verbal cueing. Pt denies any lightheadedness in change in position even just receiving blood products.  Ambulation/Gait Ambulation/Gait assistance: Supervision Ambulation Distance (Feet): 150 Feet Assistive device: Rolling walker (2 wheeled) Gait Pattern/deviations: Step-to pattern;Step-through pattern;Antalgic Gait velocity: decreased  Gait velocity interpretation: Below normal speed for age/gender General Gait Details: Pt transistioned to a step-through gait pattern and does well with minimal verbal cues for sequencing of RW and steps. Pt denied any lightheadedness even with ambulation after receiving blood products.          Balance Overall balance assessment: Needs assistance Sitting-balance support: No upper extremity supported;Feet supported Sitting balance-Leahy Scale: Good     Standing balance support: Bilateral upper extremity supported Standing balance-Leahy Scale: Poor Standing balance comment: Requires bilateral UE support on RW                    Cognition Arousal/Alertness: Awake/alert Behavior During Therapy: WFL for tasks assessed/performed Overall Cognitive Status: Within Functional Limits for tasks assessed                      Exercises Total Joint Exercises Ankle Circles/Pumps: AROM;Both;20 reps Heel Slides: AAROM;Left;10 reps Hip ABduction/ADduction: AAROM;Left;10 reps Straight Leg Raises: AAROM;Left;10 reps    General Comments General comments (skin integrity, edema, etc.): Pt's blood transfusion finished during our session. RN came in and disconnected pt. Pt denied feeling any lightheadedness. Vitals were checked by RN, and pt was given the okay to ambulate. Pt was asymptomatic throughout entire tx and  reported he wanted to go home.      Pertinent Vitals/Pain Pain Assessment: 0-10 Pain Score: 4  Pain Location: L knee Pain Descriptors / Indicators: Aching  (stiff )           PT Goals (current goals can now be found in the care plan section) Acute Rehab PT Goals Patient Stated Goal: go home and be able to walk  PT Goal Formulation: With patient Time For Goal Achievement: 10/05/15 Potential to Achieve Goals: Good Progress towards PT goals: Progressing toward goals    Frequency  7X/week    PT Plan Current plan remains appropriate       End of Session Equipment Utilized During Treatment: Gait belt Activity Tolerance: Patient tolerated treatment well Patient left: in bed;with call bell/phone within reach;with family/visitor present     Time: 1516 (interrupted by changing blood; charging 2 units not 3)-1554 PT Time Calculation (min) (ACUTE ONLY): 38 min  Charges:     1 gait, 1 therapeutic exercise                    [place], [place] [phone] office  Chase Picket 09/23/2015, 4:02 PM

## 2015-09-24 ENCOUNTER — Encounter (HOSPITAL_COMMUNITY): Payer: Self-pay | Admitting: Orthopaedic Surgery

## 2015-09-24 LAB — TYPE AND SCREEN
ABO/RH(D): A POS
ANTIBODY SCREEN: NEGATIVE
UNIT DIVISION: 0
Unit division: 0

## 2015-09-27 NOTE — Anesthesia Postprocedure Evaluation (Signed)
Anesthesia Post Note  Patient: [name]  Procedure(s) Performed: Procedure(s) (LRB): RE-IMPLANT/REVISION LEFT TOTAL KNEE ARTHROPLASTY (Left)  Patient location during evaluation: PACU Anesthesia Type: General and Regional Level of consciousness: awake and alert Pain management: pain level controlled Vital Signs Assessment: post-procedure vital signs reviewed and stable Respiratory status: spontaneous breathing, nonlabored ventilation, respiratory function stable and patient connected to nasal cannula oxygen Cardiovascular status: blood pressure returned to baseline and stable Postop Assessment: no signs of nausea or vomiting Anesthetic complications: no    Last Vitals:  Filed Vitals:   09/23/15 1310 09/23/15 1530  BP: 122/58 134/67  Pulse: 88   Temp: 37.6 C 37.2 C  Resp: 18 20    Last Pain:  Filed Vitals:   09/23/15 1543  PainSc: 2                  [name]

## 2015-10-01 NOTE — Addendum Note (Signed)
Addendum  created 10/01/15 1532 by Kipp Brood, MD   Modules edited: Notes Section   Notes Section:  File: [id number]

## 2015-10-01 NOTE — Anesthesia Postprocedure Evaluation (Signed)
Anesthesia Post Note  Patient: [name]  Procedure(s) Performed: Procedure(s) (LRB): RE-IMPLANT/REVISION LEFT TOTAL KNEE ARTHROPLASTY (Left)  Patient location during evaluation: PACU Anesthesia Type: General Level of consciousness: awake, awake and alert and oriented Pain management: pain level controlled Vital Signs Assessment: vitals unstable and post-procedure vital signs reviewed and stable Respiratory status: spontaneous breathing, nonlabored ventilation and respiratory function stable Cardiovascular status: blood pressure returned to baseline Anesthetic complications: no    Last Vitals:  Filed Vitals:   09/23/15 1310 09/23/15 1530  BP: 122/58 134/67  Pulse: 88   Temp: 37.6 C 37.2 C  Resp: 18 20    Last Pain:  Filed Vitals:   09/23/15 1543  PainSc: 2                  Herminia Warren COKER

## 2015-10-18 ENCOUNTER — Ambulatory Visit (INDEPENDENT_AMBULATORY_CARE_PROVIDER_SITE_OTHER): Payer: Medicare HMO | Admitting: Rehabilitative and Restorative Service Providers"

## 2015-10-18 ENCOUNTER — Encounter: Payer: Self-pay | Admitting: Rehabilitative and Restorative Service Providers"

## 2015-10-18 DIAGNOSIS — R531 Weakness: Secondary | ICD-10-CM

## 2015-10-18 DIAGNOSIS — Z7409 Other reduced mobility: Secondary | ICD-10-CM

## 2015-10-18 DIAGNOSIS — M25662 Stiffness of left knee, not elsewhere classified: Secondary | ICD-10-CM

## 2015-10-18 DIAGNOSIS — R269 Unspecified abnormalities of gait and mobility: Secondary | ICD-10-CM

## 2015-10-18 DIAGNOSIS — R29898 Other symptoms and signs involving the musculoskeletal system: Secondary | ICD-10-CM

## 2015-10-18 DIAGNOSIS — M25562 Pain in left knee: Secondary | ICD-10-CM | POA: Diagnosis not present

## 2015-10-18 NOTE — Therapy (Signed)
[facility] [address] [place], [place], [address] Phone: [phone]   Fax:  [phone]  Physical Therapy Evaluation  Patient Details  Name: [name] MRN: [id number] Date of Birth: Jul 19, 1956 Referring Provider: Dr. Doneen Poisson  Encounter Date: 10/18/2015      PT End of Session - 10/18/15 1730    Visit Number 1   Number of Visits 36   Date for PT Re-Evaluation 01/10/16   PT Start Time 0800   PT Stop Time 0859   PT Time Calculation (min) 59 min   Activity Tolerance Patient tolerated treatment well      Past Medical History  Diagnosis Date  . OCD (obsessive compulsive disorder)   . Anxiety   . History of kidney stones     x1 15 years ago  . Restless leg syndrome     takes requip daily  . Cataract     left eye  . Headache     hx of migraines-none recent  . Rheumatoid arthritis(714.0)     sees Dr Sharmon Revere @ [facility] in [place]  . Depression     takes Cymbalta daily  . ADD (attention deficit disorder)     takes Ritalin daily  . GERD (gastroesophageal reflux disease)     takes Omeprazole daily  . Enlarged prostate     takes Flomax daily   . Numbness and tingling     fingers bilat comes and goes   . Wears glasses   . History of blood transfusion   . Kidney stones   . Anemia     Past Surgical History  Procedure Laterality Date  . Total hip arthroplasty Right 01/2012    done in [facility]  . Hand tendon surgery Right 06/2012    done @ [facility] in [place]  . Rotator cuff repair Right 2008     done in [facility]  . Anterior lumbar fusion  09/13/2012    Procedure: ANTERIOR LUMBAR FUSION 1 LEVEL;  Surgeon: Reinaldo Meeker, MD;  Location: [facility];  Service: Neurosurgery;  Laterality: N/A;  lumbar four-five  . Anterior lat lumbar fusion  09/13/2012    Procedure: ANTERIOR LATERAL LUMBAR FUSION 2 LEVELS;  Surgeon: Reinaldo Meeker, MD;  Location: [facility];  Service: Neurosurgery;   Laterality: Right;  lumbar two three,three-four  . Lumbar percutaneous pedicle screw 2 level  09/13/2012    Procedure: LUMBAR PERCUTANEOUS PEDICLE SCREW 2 LEVEL;  Surgeon: Reinaldo Meeker, MD;  Location: [facility];  Service: Neurosurgery;  Laterality: Left;  left pedicle screws two -three,three-four  . Abdominal exposure  09/13/2012    Procedure: ABDOMINAL EXPOSURE;  Surgeon: Larina Earthly, MD;  Location: [facility];  Service: Vascular;  Laterality: N/A;  . Total knee arthroplasty Left 01/08/2015    Procedure: LEFT TOTAL KNEE ARTHROPLASTY;  Surgeon: Kathryne Hitch, MD;  Location: [facility];  Service: Orthopedics;  Laterality: Left;  . I&d knee with poly exchange Left 05/07/2015    Procedure: IRRIGATION AND DEBRIDEMENT LEFT KNEE WITH POLY EXCHANGE;  Surgeon: Kathryne Hitch, MD;  Location: [facility];  Service: Orthopedics;  Laterality: Left;  . Knee arthroscopy Left 06/22/2015    Procedure: LEFT KNEE ARTHROSCOPY WITH DEBRIDEMENT, SYNOVECTOMY;  Surgeon: Kathryne Hitch, MD;  Location: [facility];  Service: Orthopedics;  Laterality: Left;  . Excisional total knee arthroplasty with antibiotic spacers Left 08/06/2015    Procedure: EXCISION OF LEFT TOTAL KNEE ARTHROPLASTY WITH PLACEMENT OF ANTIBIOTIC  SPACERS;  Surgeon: Kathryne Hitch, MD;  Location: [facility]S;  Service: Orthopedics;  Laterality: Left;  . Total knee revision Left 09/20/2015    Procedure: RE-IMPLANT/REVISION LEFT TOTAL KNEE ARTHROPLASTY;  Surgeon: Kathryne Hitch, MD;  Location: [facility] OR;  Service: Orthopedics;  Laterality: Left;    There were no vitals filed for this visit.  Visit Diagnosis:  Knee pain, left - Plan: PT plan of care cert/re-cert  Stiffness of knee joint, left - Plan: PT plan of care cert/re-cert  Weakness of left lower extremity - Plan: PT plan of care cert/re-cert  Abnormal gait - Plan: PT plan of care cert/re-cert  Decreased strength, endurance, and mobility - Plan: PT plan of care  cert/re-cert      Subjective Assessment - 10/18/15 0808    Subjective Patient reports that he underwent Lt TKA 01/08/15; I & D 05/07/15; removal  of TKA 08/06/15 and revision 09/20/15. He has been seen by [facility] PT with good progress. Will experience sharp pain in the knee several times each day. Working to regain motion and function.    Pertinent History RA; Rt THA 6/13; hand tendon repair 11/13; lumbar fusion 3 levels rod/plate 7/85; Rt RCR > 10 yrs ago   How long can you sit comfortably? 20 min    How long can you stand comfortably? not at all    How long can you walk comfortably? not at all    Diagnostic tests Xrays    Patient Stated Goals regain use of Lt leg; 110 degrees of bend and full extension; strengthen legs so he can walk without assistive device   Currently in Pain? Yes   Pain Score 3    Pain Location Knee   Pain Orientation Left   Pain Descriptors / Indicators Aching  intermittent sharp shooting pain behind and through knee    Pain Type Surgical pain   Pain Onset More than a month ago   Pain Frequency Constant  constant ache   Aggravating Factors  prolonged sitting; standing; changing positions   Pain Relieving Factors changing positions; sitting in a recliner; ice              [facility] PT Assessment - 10/18/15 0001    Assessment   Medical Diagnosis Lt TKA   Referring Provider Dr. Doneen Poisson   Onset Date/Surgical Date 09/20/15  first TKA 01/08/15   Hand Dominance Right   Next MD Visit 11/01/15   Prior Therapy home health and hospital based    Precautions   Precautions None   Restrictions   Other Position/Activity Restrictions weight bearing as tolerated    Balance Screen   Has the patient fallen in the past 6 months No   Has the patient had a decrease in activity level because of a fear of falling?  No   Is the patient reluctant to leave their home because of a fear of falling?  No   Home Environment   Additional Comments multilevel home some difficulty  with steps    Prior Function   Level of Independence Independent   Vocation Retired;On disability  8/15   Leisure yard work; household chores; Animator; reading    Observation/Other Assessments   Focus on Therapeutic Outcomes (FOTO)  72% limitation    Sensation   Additional Comments WFL's er t report    Posture/Postural Control   Posture Comments weight shifted to the Rt with decreased wt bearing Lt LE    AROM   Right Knee Extension -8  Right Knee Flexion 134   Left Knee Extension -19   Left Knee Flexion 90   Strength   Right Hip Flexion 5/5   Right Hip Extension 4+/5   Right Hip ABduction 5/5   Right Hip ADduction 5/5   Left Hip Flexion 4+/5   Left Hip Extension 4-/5   Left Hip ABduction 4/5   Left Hip ADduction 4+/5   Right Knee Flexion 5/5   Right Knee Extension 5/5   Left Knee Flexion 3/5   Left Knee Extension 3/5   Right/Left Ankle --  5/5 bilat ankle DF   Flexibility   Hamstrings unable to achieve full knee ext for testing - tightness noted Rt at ~ 70 deg; Lt ~ 60-65 deg    Quadriceps prone heel to buttock Rt ~ 7 inches; Lt knee at ~80 deg flexion    ITB tight Lt   Piriformis tight Lt    Palpation   Patella mobility decreaed patellar mobility in all planes    Palpation comment tight through quads/hamstrings/calf    Ambulation/Gait   Gait Comments ambulates with single axillary crutch on Rt leaning heavily on Rt crutch with trunk shifted to the Rt; decreased wt bearing on the Lt; decreased release Lt knee with initiation of swing phase                    [facility] Adult PT Treatment/Exercise - 10/18/15 0001    Therapeutic Activites    Therapeutic Activities --  myofacial work with ball and stick    Neuro Re-ed    Neuro Re-ed Details  discussed and corrected gait - pt to use two crutches to correct posture and encourage more ezual we bearing    Knee/Hip Exercises: Stretches   Passive Hamstring Stretch 3 reps;30 seconds   ITB Stretch --   Knee/Hip  Exercises: Supine   Quad Sets Left;1 set;10 reps  10 sec hold    Programme researcher, broadcasting/film/video Location Lt knee    Electrical Stimulation Action IFC   Electrical Stimulation Parameters to tolerance    Electrical Stimulation Goals Pain;Edema   Vasopneumatic   Number Minutes Vasopneumatic  15 minutes   Vasopnuematic Location  Knee   Vasopneumatic Pressure Medium   Vasopneumatic Temperature  3*   Manual Therapy   Manual therapy comments instructed in scar massage for home                 PT Education - 10/18/15 0849    Education provided Yes   Education Details HEP TENS    Person(s) Educated Patient   Methods Explanation;Demonstration;Tactile cues;Verbal cues;Handout   Comprehension Verbalized understanding;Returned demonstration;Verbal cues required;Tactile cues required          PT Short Term Goals - 10/18/15 1720    PT SHORT TERM GOAL #1   Title Instruct pt in appropriate gait pattern with appropriate assistive device 11/29/15   Time 6   Period Weeks   Status New   PT SHORT TERM GOAL #2   Title Increase Lt knee ext to -8 to -6 deg 11/29/15   Time 6   Period Weeks   Status New   PT SHORT TERM GOAL #3   Title Increase Lt knee flexion to 110-115 deg 11/29/15   Time 6   Period Weeks   Status New   PT SHORT TERM GOAL #4   Title Increase strength Lt LE to 4+/5 throughout 11/29/15   Time 6   Period Weeks  Status New           PT Long Term Goals - 10/18/15 1716    PT LONG TERM GOAL #1   Title Improve gait pattern with patient to demonstrate normal gait with minimal limp with appropriate assistive device 01/10/16   Time 12   Period Weeks   Status New   PT LONG TERM GOAL #2   Title Increase knee ROM to -5 to -7 degrees extension and 120-125 deg flexion 01/10/16   Time 12   Period Weeks   Status New   PT LONG TERM GOAL #3   Title Patient reports return to normal functional activities including sitting; standing; walking for 30-60 min  without difficulty 01/10/16   Time 12   Period Weeks   Status New   PT LONG TERM GOAL #4   Title I in HEP 01/10/16   Time 12   Period Weeks   Status Revised   PT LONG TERM GOAL #5   Title Improve FOTO to </= 49% limitation 01/10/16   Time 12   Period Weeks   Status New               Plan - 10/18/15 1731    Clinical Impression Statement Patient presents s/p revision of Lt TKA 09/20/15 with continued pain; limited ROM; weakness; abnormal gait pattern; limited functional activity level. he will benefit form PT to address deficits identified and improve functional abilities.    Pt will benefit from skilled therapeutic intervention in order to improve on the following deficits Postural dysfunction;Improper body mechanics;Abnormal gait;Increased fascial restricitons;Decreased range of motion;Decreased mobility;Decreased strength;Decreased endurance;Decreased activity tolerance;Pain   Rehab Potential Good   PT Frequency 3x / week   PT Duration 12 weeks   PT Treatment/Interventions Patient/family education;ADLs/Self Care Home Management;Therapeutic exercise;Therapeutic activities;Manual techniques;Neuromuscular re-education;Dry needling;Cryotherapy;Electrical Stimulation;Moist Heat;Ultrasound;Gait training;Functional mobility training;Balance training   PT Next Visit Plan progress stretching for calf/quads/hamstrings/IT band; manual work and modalities to address muculsar tightness through Lt LE; gait training    PT Home Exercise Plan HEP myofacial work    Financial planner with Plan of Care Patient         Problem List Patient Active Problem List   Diagnosis Date Noted  . 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 Status post revision of total replacement of left knee 09/20/2015  . 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 Effusion of left knee joint; questionable  prosthetic joint infection 05/07/2015  . Knee effusion 05/07/2015  . Status post total left knee replacement 01/08/2015  . Osteoarthritis of left knee 12/25/2014  . Lumbar spondylosis L2-5 09/14/2012    Celyn Rober Minion PT, MPH  10/18/2015, 5:37 PM  [facility] [address] [place], [place], [address] Phone: [phone]   Fax:  [phone]  Name: Edy Belt MRN: [id number] Date of Birth: [birth year]

## 2015-10-18 NOTE — Patient Instructions (Signed)
Self massage using various size balls - thigh and calf  "The Stick" on line or imitation from target   Scar Tissue Massage    Place pad of fingertip on scar area. Apply steady downward pressure while moving in circular fashion. Use another fin-ger on top to assist. Repeat until entire scar has been covered.  about 10 min  Do __2__ sessions per day.   HIP: Hamstrings - Supine    Place strap around foot. Raise leg up, keep knee straight. Hold _3__ seconds. _3__ reps per set, __3-4_ sets per day  TENS UNIT: This is helpful for muscle pain and spasm.   Search and Purchase a TENS 7000 2nd edition at www.tenspros.com. It should be less than $30.     TENS unit instructions: Do not shower or bathe with the unit on Turn the unit off before removing electrodes or batteries If the electrodes lose stickiness add a drop of water to the electrodes after they are disconnected from the unit and place on plastic sheet. If you continued to have difficulty, call the TENS unit company to purchase more electrodes. Do not apply lotion on the skin area prior to use. Make sure the skin is clean and dry as this will help prolong the life of the electrodes. After use, always check skin for unusual red areas, rash or other skin difficulties. If there are any skin problems, does not apply electrodes to the same area. Never remove the electrodes from the unit by pulling the wires. Do not use the TENS unit or electrodes other than as directed. Do not change electrode placement without consultating your therapist or physician. Keep 2 fingers with between each electrode. Wear time ratio is 2:1, on to off times.    For example on for 30 minutes off for 15 minutes and then on for 30 minutes off for 15 minutes

## 2015-10-19 ENCOUNTER — Ambulatory Visit: Payer: Medicare HMO | Admitting: Physical Therapy

## 2015-10-20 ENCOUNTER — Ambulatory Visit (INDEPENDENT_AMBULATORY_CARE_PROVIDER_SITE_OTHER): Payer: Medicare HMO | Admitting: Physical Therapy

## 2015-10-20 DIAGNOSIS — R29898 Other symptoms and signs involving the musculoskeletal system: Secondary | ICD-10-CM

## 2015-10-20 DIAGNOSIS — M25562 Pain in left knee: Secondary | ICD-10-CM

## 2015-10-20 DIAGNOSIS — Z7409 Other reduced mobility: Secondary | ICD-10-CM

## 2015-10-20 DIAGNOSIS — R269 Unspecified abnormalities of gait and mobility: Secondary | ICD-10-CM

## 2015-10-20 DIAGNOSIS — M25662 Stiffness of left knee, not elsewhere classified: Secondary | ICD-10-CM

## 2015-10-20 DIAGNOSIS — R531 Weakness: Secondary | ICD-10-CM

## 2015-10-20 NOTE — Therapy (Signed)
[place], [place], [address] Phone: [phone]   Fax:  [phone]  Physical Therapy Treatment  Patient Details  Name: [name] MRN: [id number] Date of Birth: 16-Apr-1956 Referring Provider: Dr. Jean Rosenthal  Encounter Date: 10/20/2015      PT End of Session - 10/20/15 0941    Visit Number 2   Number of Visits 36   Date for PT Re-Evaluation 01/10/16   PT Start Time 0934   PT Stop Time 1033   PT Time Calculation (min) 59 min   Activity Tolerance Patient tolerated treatment well      Past Medical History  Diagnosis Date  . OCD (obsessive compulsive disorder)   . Anxiety   . History of kidney stones     x1 15 years ago  . Restless leg syndrome     takes requip daily  . Cataract     left eye  . Headache     hx of migraines-none recent  . Rheumatoid arthritis(714.0)     sees Dr Gerilyn Nestle @ [facility] in [place]  . Depression     takes Cymbalta daily  . ADD (attention deficit disorder)     takes Ritalin daily  . GERD (gastroesophageal reflux disease)     takes Omeprazole daily  . Enlarged prostate     takes Flomax daily   . Numbness and tingling     fingers bilat comes and goes   . Wears glasses   . History of blood transfusion   . Kidney stones   . Anemia     Past Surgical History  Procedure Laterality Date  . Total hip arthroplasty Right 01/2012    done in [facility]  . Hand tendon surgery Right 06/2012    done @ [facility] in [place]  . Rotator cuff repair Right 2008     done in [facility]  . Anterior lumbar fusion  09/13/2012    Procedure: ANTERIOR LUMBAR FUSION 1 LEVEL;  Surgeon: Faythe Ghee, MD;  Location: [facility];  Service: Neurosurgery;  Laterality: N/A;  lumbar four-five  . Anterior lat lumbar fusion  09/13/2012    Procedure: ANTERIOR LATERAL LUMBAR FUSION 2 LEVELS;  Surgeon: Faythe Ghee, MD;  Location: [facility];  Service: Neurosurgery;   Laterality: Right;  lumbar two three,three-four  . Lumbar percutaneous pedicle screw 2 level  09/13/2012    Procedure: LUMBAR PERCUTANEOUS PEDICLE SCREW 2 LEVEL;  Surgeon: Faythe Ghee, MD;  [facility]: [facility];  Service: Neurosurgery;  Laterality: Left;  left pedicle screws two -three,three-four  . Abdominal exposure  09/13/2012    Procedure: ABDOMINAL EXPOSURE;  Surgeon: Rosetta Posner, MD;  [facility]: [facility];  Service: Vascular;  Laterality: N/A;  . Total knee arthroplasty Left 01/08/2015    Procedure: LEFT TOTAL KNEE ARTHROPLASTY;  Surgeon: Mcarthur Rossetti, MD;  Location: [facility];  Service: Orthopedics;  Laterality: Left;  . I&d knee with poly exchange Left 05/07/2015    Procedure: IRRIGATION AND DEBRIDEMENT LEFT KNEE WITH POLY EXCHANGE;  Surgeon: Mcarthur Rossetti, MD;  [facility]: [facility];  Service: Orthopedics;  Laterality: Left;  . Knee arthroscopy Left 06/22/2015    Procedure: LEFT KNEE ARTHROSCOPY WITH DEBRIDEMENT, SYNOVECTOMY;  Surgeon: Mcarthur Rossetti, MD;  Location: [facility];  Service: Orthopedics;  Laterality: Left;  . Excisional total knee arthroplasty with antibiotic spacers Left 08/06/2015    Procedure: EXCISION OF LEFT TOTAL KNEE ARTHROPLASTY WITH PLACEMENT OF ANTIBIOTIC  SPACERS;  Surgeon: Mcarthur Rossetti, MD;  [facility];  Service: Orthopedics;  Laterality: Left;  . Total knee revision Left 09/20/2015    Procedure: RE-IMPLANT/REVISION LEFT TOTAL KNEE ARTHROPLASTY;  Surgeon: Mcarthur Rossetti, MD;  Location: [facility];  Service: Orthopedics;  Laterality: Left;    There were no vitals filed for this visit.  Visit Diagnosis:  Knee pain, left  Stiffness of knee joint, left  Weakness of left lower extremity  Abnormal gait  Decreased strength, endurance, and mobility      Subjective Assessment - 10/20/15 0938    Subjective Nothing new to report.     Currently in Pain? Yes   Pain Score 2   up to 8/10 with "zingers"from bending the knee     Pain Location Knee   Pain Orientation Left            [facility] PT Assessment - 10/20/15 0001    Assessment   Medical Diagnosis Lt TKA   AROM   Left Knee Extension -4  with quad set   Left Knee Flexion 100  on bicycle                     [facility] Adult PT Treatment/Exercise - 10/20/15 0001    Ambulation/Gait   Ambulation/Gait Yes   Ambulation/Gait Assistance 6: Modified independent (Device/Increase time)   Ambulation Distance (Feet) 300 Feet   Assistive device Crutches;R Axillary Crutch;Straight cane   Gait Pattern Step-through pattern;Decreased stance time - left;Abducted - left  decreased Lt knee flexion and toe off.    Ambulation Surface Level;Indoor   Pre-Gait Activities tandem stance  with Lt knee flex/ext    Gait Comments Mirror for visual feedback.  VC for Lt foot neutral and less abducted, increased toe off on Lt foot, and allowing knee flexion during swing through.    Self-Care   Self-Care Scar Mobilizations   Scar Mobilizations Educated pt on how to perform scar massage.  Pt verbalized understanding.    Knee/Hip Exercises: Aerobic   Stationary Bike Partial revolutions x 5 min, able to make full revolution at end of 5 min    Knee/Hip Exercises: Seated   Other Seated Knee/Hip Exercises seated scoots on black mat table to increase Lt knee flexion ROM x 10 reps x 10 sec hold.    Knee/Hip Exercises: Supine   Quad Sets 5 reps;Left  10 sec hold   Straight Leg Raises Left;1 set;10 reps   Knee/Hip Exercises: Sidelying   Hip ABduction Left;2 sets;10 reps   Knee/Hip Exercises: Prone   Hip Extension Strengthening;Left;1 set;10 reps   Hip Extension Limitations TKE LLE x 5 sec x 10    Electrical Stimulation   Electrical Stimulation Location Lt knee    Electrical Stimulation Action IFC   Electrical Stimulation Parameters to tolerance    Electrical Stimulation Goals Pain;Edema   Vasopneumatic   Number Minutes Vasopneumatic  15 minutes   Vasopnuematic Location  Knee    Vasopneumatic Pressure Medium   Vasopneumatic Temperature  3*                PT Education - 10/20/15 1024    Education provided Yes   Education Details HEP    Person(s) Educated Patient   Methods Handout;Demonstration;Explanation   Comprehension Verbalized understanding;Returned demonstration          PT Short Term Goals - 10/20/15 1029    PT SHORT TERM GOAL #1   Title Instruct pt in appropriate gait pattern  with appropriate assistive device 11/29/15   Time 6   Period Weeks   Status On-going   PT SHORT TERM GOAL #2   Title Increase Lt knee ext to -8 to -6 deg 11/29/15   Time 6   Period Weeks   Status Achieved   PT SHORT TERM GOAL #3   Title Increase Lt knee flexion to 110-115 deg 11/29/15   Time 6   Period Weeks   Status On-going   PT SHORT TERM GOAL #4   Title Increase strength Lt LE to 4+/5 throughout 11/29/15   Time 6   Period Weeks   Status On-going           PT Long Term Goals - 10/18/15 1716    PT LONG TERM GOAL #1   Title Improve gait pattern with patient to demonstrate normal gait with minimal limp with appropriate assistive device 01/10/16   Time 12   Period Weeks   Status New   PT LONG TERM GOAL #2   Title Increase knee ROM to -5 to -7 degrees extension and 120-125 deg flexion 01/10/16   Time 12   Period Weeks   Status New   PT LONG TERM GOAL #3   Title Patient reports return to normal functional activities including sitting; standing; walking for 30-60 min without difficulty 01/10/16   Time 12   Period Weeks   Status New   PT LONG TERM GOAL #4   Title I in HEP 01/10/16   Time 12   Period Weeks   Status Revised   PT LONG TERM GOAL #5   Title Improve FOTO to </= 49% limitation 01/10/16   Time 12   Period Weeks   Status New               Plan - 10/20/15 1027    Clinical Impression Statement Pt demonstrated improved Lt knee ROM.  Pt demonstrated improved gait quality with VC, mirror as visual feedback and repetition.  Pt  tolerated other exercises with mild increase in pain.  This pain was reduced with use of estim and vaso. Has met STG #2 and is Progressing towards goals.    Pt will benefit from skilled therapeutic intervention in order to improve on the following deficits Postural dysfunction;Improper body mechanics;Abnormal gait;Increased fascial restricitons;Decreased range of motion;Decreased mobility;Decreased strength;Decreased endurance;Decreased activity tolerance;Pain   Rehab Potential Good   PT Frequency 3x / week   PT Duration 12 weeks   PT Treatment/Interventions Patient/family education;ADLs/Self Care Home Management;Therapeutic exercise;Therapeutic activities;Manual techniques;Neuromuscular re-education;Dry needling;Cryotherapy;Electrical Stimulation;Moist Heat;Ultrasound;Gait training;Functional mobility training;Balance training   PT Next Visit Plan progress stretching for calf/quads/hamstrings/IT band; manual work and modalities to address muculsar tightness through Lt LE; gait training    Consulted and Agree with Plan of Care Patient        Problem List Patient Active Problem List   Diagnosis Date Noted  . 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 Status post revision of total replacement of left knee 09/20/2015  . Infection of total left knee replacement ([place]) 08/06/2015  . Effusion of left knee joint; questionable prosthetic joint infection 05/07/2015  . Knee effusion 05/07/2015  . Status post total left knee replacement 01/08/2015  . Osteoarthritis of left knee 12/25/2014  . Lumbar spondylosis L2-5 09/14/2012   Kerin Perna, PTA 10/20/2015 12:33 PM  [place], [place], [address] Phone: [phone]  Fax:  [phone]  Name: [name] MRN: [id number] Date of Birth: Feb 04, 1956

## 2015-10-20 NOTE — Patient Instructions (Signed)
Straight Leg Raise   Tighten stomach and slowly raise locked right leg __6-8__ inches from floor. Repeat __10__ times per set. Do _2___ sets per session. Do ___1_ sessions per day.  Hip Extension (Prone)   Lift left leg _2_ inches from floor, keeping knee locked. Repeat __10__ times per set. Do _2___ sets per session. Do _1___ sessions per day.  Strengthening: Hip Abduction (Side2-Lying)   Tighten muscles on front of left thigh, then lift leg _6-8___ inches from surface, keeping knee locked.  Repeat _10___ times per set. Do _2___ sets per session. Do __1__ sessions per day.   Strengthening: Hip Adduction (Side-Lying)  Outer Hip Stretch: Reclined IT Band Stretch (Strap) Tighten muscles on front of right thigh, then lift leg ___3_ inches from surface, keeping knee locked.  Repeat _10__ times per set. Do _2___ sets per session. Do _1___ sessions per day.  Scooting    With hands on surface, "walk" forward to front by shifting weight with hands. Keep left foot still until stretch is felt in Left knee. Hold 5-10 sec  Repeat _10___ times per session. Do __2__ sessions per day.   [facility] [address] [place], [place] [address]  [phone] (office) [phone] (fax)

## 2015-10-22 ENCOUNTER — Ambulatory Visit (INDEPENDENT_AMBULATORY_CARE_PROVIDER_SITE_OTHER): Payer: Medicare HMO | Admitting: Physical Therapy

## 2015-10-22 DIAGNOSIS — Z7409 Other reduced mobility: Secondary | ICD-10-CM

## 2015-10-22 DIAGNOSIS — M25662 Stiffness of left knee, not elsewhere classified: Secondary | ICD-10-CM

## 2015-10-22 DIAGNOSIS — R6889 Other general symptoms and signs: Secondary | ICD-10-CM

## 2015-10-22 DIAGNOSIS — M25562 Pain in left knee: Secondary | ICD-10-CM

## 2015-10-22 DIAGNOSIS — R29898 Other symptoms and signs involving the musculoskeletal system: Secondary | ICD-10-CM | POA: Diagnosis not present

## 2015-10-22 DIAGNOSIS — R269 Unspecified abnormalities of gait and mobility: Secondary | ICD-10-CM

## 2015-10-22 DIAGNOSIS — R531 Weakness: Secondary | ICD-10-CM

## 2015-10-22 NOTE — Therapy (Signed)
[facility] [place] [address] [place], [place], [address] Phone: [phone]   Fax:  [phone]  Physical Therapy Treatment  Patient Details  Name: [name] MRN: [id number] Date of Birth: [birth year] Referring Provider: Dr. Doneen Poisson  Encounter Date: 10/22/2015      PT End of Session - 10/22/15 0938    Visit Number 3   Number of Visits 36   Date for PT Re-Evaluation 01/10/16   PT Start Time 0932   PT Stop Time 1039   PT Time Calculation (min) 67 min      Past Medical History  Diagnosis Date  . OCD (obsessive compulsive disorder)   . Anxiety   . History of kidney stones     x1 15 years ago  . Restless leg syndrome     takes requip daily  . Cataract     left eye  . Headache     hx of migraines-none recent  . Rheumatoid arthritis(714.0)     sees Dr Sharmon Revere @ [facility] in [place]  . Depression     takes Cymbalta daily  . ADD (attention deficit disorder)     takes Ritalin daily  . GERD (gastroesophageal reflux disease)     takes Omeprazole daily  . Enlarged prostate     takes Flomax daily   . Numbness and tingling     fingers bilat comes and goes   . Wears glasses   . History of blood transfusion   . Kidney stones   . Anemia     Past Surgical History  Procedure Laterality Date  . Total hip arthroplasty Right 01/2012    done in [facility]  . Hand tendon surgery Right 06/2012    done @ [facility] in [place]  . Rotator cuff repair Right 2008     done in [facility]  . Anterior lumbar fusion  09/13/2012    Procedure: ANTERIOR LUMBAR FUSION 1 LEVEL;  Surgeon: Reinaldo Meeker, MD;  Location: [facility];  Service: Neurosurgery;  Laterality: N/A;  lumbar four-five  . Anterior lat lumbar fusion  09/13/2012    Procedure: ANTERIOR LATERAL LUMBAR FUSION 2 LEVELS;  Surgeon: Reinaldo Meeker, MD;  Location: [facility];  Service: Neurosurgery;  Laterality: Right;  lumbar two three,three-four  . Lumbar  percutaneous pedicle screw 2 level  09/13/2012    Procedure: LUMBAR PERCUTANEOUS PEDICLE SCREW 2 LEVEL;  Surgeon: Reinaldo Meeker, MD;  Location: [facility];  Service: Neurosurgery;  Laterality: Left;  left pedicle screws two -three,three-four  . Abdominal exposure  09/13/2012    Procedure: ABDOMINAL EXPOSURE;  Surgeon: Larina Earthly, MD;  [facility]: [facility];  Service: Vascular;  Laterality: N/A;  . Total knee arthroplasty Left 01/08/2015    Procedure: LEFT TOTAL KNEE ARTHROPLASTY;  Surgeon: Kathryne Hitch, MD;  [facility];  Service: Orthopedics;  Laterality: Left;  . I&d knee with poly exchange Left 05/07/2015    Procedure: IRRIGATION AND DEBRIDEMENT LEFT KNEE WITH POLY EXCHANGE;  Surgeon: Kathryne Hitch, MD;  [facility];  Service: Orthopedics;  Laterality: Left;  . Knee arthroscopy Left 06/22/2015    Procedure: LEFT KNEE ARTHROSCOPY WITH DEBRIDEMENT, SYNOVECTOMY;  Surgeon: Kathryne Hitch, MD;  [facility];  Service: Orthopedics;  Laterality: Left;  . Excisional total knee arthroplasty with antibiotic spacers Left 08/06/2015    Procedure: EXCISION OF LEFT TOTAL KNEE ARTHROPLASTY WITH PLACEMENT OF ANTIBIOTIC SPACERS;  Surgeon: Kathryne Hitch, MD;  [facility];  Service: Orthopedics;  Laterality: Left;  . Total knee revision Left 09/20/2015    Procedure: RE-IMPLANT/REVISION LEFT TOTAL KNEE ARTHROPLASTY;  Surgeon: Kathryne Hitch, MD;  Location: [facility] OR;  Service: Orthopedics;  Laterality: Left;    There were no vitals filed for this visit.  Visit Diagnosis:  Knee pain, left  Stiffness of knee joint, left  Weakness of left lower extremity  Abnormal gait  Decreased strength, endurance, and mobility      Subjective Assessment - 10/22/15 0938    Subjective Pt reports he had a few "zingers" in his Lt knee.  He's not sure if he irritated it with vacuuming without crutches.     Currently in Pain? Yes   Pain Score 2   with pain up to  8/10 with bending   Pain Location Knee   Pain Orientation Left            [facility] PT Assessment - 10/22/15 0001    Assessment   Medical Diagnosis Lt TKA   AROM   Left Knee Extension -3         [facility] Adult PT Treatment/Exercise - 10/22/15 0001    Knee/Hip Exercises: Aerobic   Stationary Bike Partial revolutions - unable to tolerate switched to NuStep   Nustep L4: 5 min (slow motion to increase ROM)   Knee/Hip Exercises: Standing   Heel Raises Both;1 set;10 reps   Terminal Knee Extension Limitations Lt TKE with green band x 5 sec hold x 15 reps    Other Standing Knee Exercises weight shifts with Lt foot on scale (to assess about of weight bearing) x 10 reps; repeated with front back weight shifts. Pt bearing ~50-75% into LLE.     Knee/Hip Exercises: Seated   Other Seated Knee/Hip Exercises seated scoots on black mat table to increase Lt knee flexion ROM x 10 reps x 10 sec hold.    Sit to Sand with UE support;10 reps  VC for even weight and controlled descent. challenge!   Knee/Hip Exercises: Supine   Straight Leg Raises Left;1 set;10 reps   Knee/Hip Exercises: Sidelying   Hip ADduction Left;1 set;10 reps   Electrical Stimulation   Electrical Stimulation Location Lt knee    Electrical Stimulation Action IFC   Electrical Stimulation Parameters to tolerance    Electrical Stimulation Goals Edema;Pain   Vasopneumatic   Number Minutes Vasopneumatic  15 minutes   Vasopnuematic Location  Knee   Vasopneumatic Pressure Medium   Vasopneumatic Temperature  3*   Manual Therapy   Manual Therapy Taping   Kinesiotex Edema   Kinesiotix   Edema web pattern placed across Lt knee to decrease swelling / decompress tissues.  Decompression strip placed at lateral Lt hamstring attachment to decrease pain.                 PT Education - 10/22/15 1256    Education provided Yes   Education Details Rock tape use/ care and instructions for gentle removal    Person(s) Educated Patient    Methods Explanation   Comprehension Verbalized understanding          PT Short Term Goals - 10/20/15 1029    PT SHORT TERM GOAL #1   Title Instruct pt in appropriate gait pattern with appropriate assistive device 11/29/15   Time 6   Period Weeks   Status On-going   PT SHORT TERM GOAL #2   Title Increase Lt knee ext to -8 to -6 deg 11/29/15  Time 6   Period Weeks   Status Achieved   PT SHORT TERM GOAL #3   Title Increase Lt knee flexion to 110-115 deg 11/29/15   Time 6   Period Weeks   Status On-going   PT SHORT TERM GOAL #4   Title Increase strength Lt LE to 4+/5 throughout 11/29/15   Time 6   Period Weeks   Status On-going           PT Long Term Goals - 10/18/15 1716    PT LONG TERM GOAL #1   Title Improve gait pattern with patient to demonstrate normal gait with minimal limp with appropriate assistive device 01/10/16   Time 12   Period Weeks   Status New   PT LONG TERM GOAL #2   Title Increase knee ROM to -5 to -7 degrees extension and 120-125 deg flexion 01/10/16   Time 12   Period Weeks   Status New   PT LONG TERM GOAL #3   Title Patient reports return to normal functional activities including sitting; standing; walking for 30-60 min without difficulty 01/10/16   Time 12   Period Weeks   Status New   PT LONG TERM GOAL #4   Title I in HEP 01/10/16   Time 12   Period Weeks   Status Revised   PT LONG TERM GOAL #5   Title Improve FOTO to </= 49% limitation 01/10/16   Time 12   Period Weeks   Status New               Plan - 10/22/15 1030    Clinical Impression Statement Pt had difficulty tolerating bicycle for warm up today; improved tolerance on NuStep. Pt had improved Lt knee extension.  Pt had improved sit to stand with repetition of exercise. Progressing towards goals.    PT Frequency 3x / week   PT Duration 12 weeks   PT Treatment/Interventions Patient/family education;ADLs/Self Care Home Management;Therapeutic exercise;Therapeutic  activities;Manual techniques;Neuromuscular re-education;Dry needling;Cryotherapy;Electrical Stimulation;Moist Heat;Ultrasound;Gait training;Functional mobility training;Balance training   PT Next Visit Plan progress stretching for calf/quads/hamstrings/IT band; manual work and modalities to address muculsar tightness through Lt LE; gait training         Problem List Patient Active Problem List   Diagnosis Date Noted  . 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 Status post revision of total replacement of left knee 09/20/2015  . 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 Effusion of left knee joint; questionable prosthetic joint infection 05/07/2015  . Knee effusion 05/07/2015  . Status post total left knee replacement 01/08/2015  . Osteoarthritis of left knee 12/25/2014  . Lumbar spondylosis L2-5 09/14/2012    Mayer Camel, PTA 10/22/2015 12:56 PM  [facility] [place] [address] [place], [place], [address] Phone: [phone]   Fax:  [phone]  Name: Haris Baack MRN: [id number] Date of Birth: [birth year]

## 2015-10-25 ENCOUNTER — Ambulatory Visit (INDEPENDENT_AMBULATORY_CARE_PROVIDER_SITE_OTHER): Payer: Medicare HMO | Admitting: Physical Therapy

## 2015-10-25 DIAGNOSIS — R269 Unspecified abnormalities of gait and mobility: Secondary | ICD-10-CM | POA: Diagnosis not present

## 2015-10-25 DIAGNOSIS — M25662 Stiffness of left knee, not elsewhere classified: Secondary | ICD-10-CM | POA: Diagnosis not present

## 2015-10-25 DIAGNOSIS — Z7409 Other reduced mobility: Secondary | ICD-10-CM

## 2015-10-25 DIAGNOSIS — M25562 Pain in left knee: Secondary | ICD-10-CM

## 2015-10-25 DIAGNOSIS — R29898 Other symptoms and signs involving the musculoskeletal system: Secondary | ICD-10-CM | POA: Diagnosis not present

## 2015-10-25 DIAGNOSIS — R531 Weakness: Secondary | ICD-10-CM

## 2015-10-25 NOTE — Therapy (Signed)
[facility] [place] [address] [place], [place], [address] Phone: [phone]   Fax:  [phone]  Physical Therapy Treatment  Patient Details  Name: [name] MRN: [id number] Date of Birth: 1956/02/12 Referring Provider: Dr. Allie Bossier   Encounter Date: 10/25/2015    Past Medical History  Diagnosis Date  . OCD (obsessive compulsive disorder)   . Anxiety   . History of kidney stones     x1 15 years ago  . Restless leg syndrome     takes requip daily  . Cataract     left eye  . Headache     hx of migraines-none recent  . Rheumatoid arthritis(714.0)     sees Dr Sharmon Revere @ [facility] in [place]  . Depression     takes Cymbalta daily  . ADD (attention deficit disorder)     takes Ritalin daily  . GERD (gastroesophageal reflux disease)     takes Omeprazole daily  . Enlarged prostate     takes Flomax daily   . Numbness and tingling     fingers bilat comes and goes   . Wears glasses   . History of blood transfusion   . Kidney stones   . Anemia     Past Surgical History  Procedure Laterality Date  . Total hip arthroplasty Right 01/2012    done in [facility]  . Hand tendon surgery Right 06/2012    done @ [facility] in [place]  . Rotator cuff repair Right 2008     done in [facility]  . Anterior lumbar fusion  09/13/2012    Procedure: ANTERIOR LUMBAR FUSION 1 LEVEL;  Surgeon: Reinaldo Meeker, MD;  [facility]: [facility];  Service: Neurosurgery;  Laterality: N/A;  lumbar four-five  . Anterior lat lumbar fusion  09/13/2012    Procedure: ANTERIOR LATERAL LUMBAR FUSION 2 LEVELS;  Surgeon: Reinaldo Meeker, MD;  Location: [facility];  Service: Neurosurgery;  Laterality: Right;  lumbar two three,three-four  . Lumbar percutaneous pedicle screw 2 level  09/13/2012    Procedure: LUMBAR PERCUTANEOUS PEDICLE SCREW 2 LEVEL;  Surgeon: Reinaldo Meeker, MD;  Location: [facility];  Service: Neurosurgery;  Laterality: Left;  left  pedicle screws two -three,three-four  . Abdominal exposure  09/13/2012    Procedure: ABDOMINAL EXPOSURE;  Surgeon: Larina Earthly, MD;  Location: [facility];  Service: Vascular;  Laterality: N/A;  . Total knee arthroplasty Left 01/08/2015    Procedure: LEFT TOTAL KNEE ARTHROPLASTY;  Surgeon: Kathryne Hitch, MD;  Location: [facility];  Service: Orthopedics;  Laterality: Left;  . I&d knee with poly exchange Left 05/07/2015    Procedure: IRRIGATION AND DEBRIDEMENT LEFT KNEE WITH POLY EXCHANGE;  Surgeon: Kathryne Hitch, MD;  Location: [facility];  Service: Orthopedics;  Laterality: Left;  . Knee arthroscopy Left 06/22/2015    Procedure: LEFT KNEE ARTHROSCOPY WITH DEBRIDEMENT, SYNOVECTOMY;  Surgeon: Kathryne Hitch, MD;  Location: [facility] OR;  Service: Orthopedics;  Laterality: Left;  . Excisional total knee arthroplasty with antibiotic spacers Left 08/06/2015    Procedure: EXCISION OF LEFT TOTAL KNEE ARTHROPLASTY WITH PLACEMENT OF ANTIBIOTIC SPACERS;  Surgeon: Kathryne Hitch, MD;  Location: [facility];  Service: Orthopedics;  Laterality: Left;  . Total knee revision Left 09/20/2015    Procedure: RE-IMPLANT/REVISION LEFT TOTAL KNEE ARTHROPLASTY;  Surgeon: Kathryne Hitch, MD;  Location: [facility] OR;  Service: Orthopedics;  Laterality: Left;    There were no vitals filed for this visit.  Visit Diagnosis:  Knee pain, left  Stiffness of knee joint, left  Weakness of left lower extremity  Abnormal gait  Decreased strength, endurance, and mobility      Subjective Assessment - 10/25/15 0940    Subjective Pt reports he continues to have "zingers" in Lt knee. Voices frustration that he continues with pain and stiffness with knee.    Currently in Pain? Yes   Pain Score 2   with pain up t o 8/10   Pain Location Knee   Pain Orientation Left   Pain Descriptors / Indicators Aching   Aggravating Factors  prolonged sitting and standing    Pain Relieving Factors changing positions, ice              [facility] PT Assessment - 10/25/15 0001    Assessment   Medical Diagnosis Lt TKA   Referring Provider Dr. Allie Bossier    Onset Date/Surgical Date 09/20/15  first TKA 01/08/15   Hand Dominance Right   Next MD Visit 11/01/15   Prior Therapy home health and hospital based    AROM   Left Knee Flexion 108  with seated scoots          [facility] Adult PT Treatment/Exercise - 10/25/15 0001    Ambulation/Gait   Ambulation/Gait Yes   Ambulation/Gait Assistance 5: Supervision   Ambulation Distance (Feet) 250 Feet   Assistive device None   Gait Pattern Step-through pattern;Decreased stance time - left;Abducted - left;Trendelenburg;Antalgic  decreased arm swing   Gait Comments Mirror for visual feedback.  VC for Lt foot neutral and less abducted, increased toe off on Lt foot, and allowing knee flexion during swing through. Tactile cues for reciprocal arm swing; minimal carry over   Knee/Hip Exercises: Stretches   Gastroc Stretch Left;2 reps;30 seconds   Knee/Hip Exercises: Aerobic   Stationary Bike Partial revolutions x 5 min, able to make full revolution at end of 5 min    Nustep L4: 2 min prior to bike    Knee/Hip Exercises: Standing   Heel Raises Both;2 sets;10 reps;1 second   Lateral Step Up Left;1 set;10 reps;Hand Hold: 2;Step Height: 6"   Forward Step Up Left;1 set;10 reps;Hand Hold: 2;Step Height: 6"   Step Down Right;1 set;10 reps;Hand Hold: 2  3"   Step Down Limitations (challenging)   Knee/Hip Exercises: Seated   Other Seated Knee/Hip Exercises seated scoots on black mat table to increase Lt knee flexion ROM x 10 reps x 10 sec hold.    Financial planner IFC   Electrical Stimulation Parameters to tolerance    Electrical Stimulation Goals Edema;Pain   Vasopneumatic   Number Minutes Vasopneumatic  15 minutes   Vasopnuematic Location  Knee   Vasopneumatic Pressure Medium   Vasopneumatic  Temperature  3*                  PT Short Term Goals - 10/20/15 1029    PT SHORT TERM GOAL #1   Title Instruct pt in appropriate gait pattern with appropriate assistive device 11/29/15   Time 6   Period Weeks   Status On-going   PT SHORT TERM GOAL #2   Title Increase Lt knee ext to -8 to -6 deg 11/29/15   Time 6   Period Weeks   Status Achieved   PT SHORT TERM GOAL #3   Title Increase Lt knee flexion to 110-115 deg 11/29/15   Time 6  Period Weeks   Status On-going   PT SHORT TERM GOAL #4   Title Increase strength Lt LE to 4+/5 throughout 11/29/15   Time 6   Period Weeks   Status On-going           PT Long Term Goals - 10/18/15 1716    PT LONG TERM GOAL #1   Title Improve gait pattern with patient to demonstrate normal gait with minimal limp with appropriate assistive device 01/10/16   Time 12   Period Weeks   Status New   PT LONG TERM GOAL #2   Title Increase knee ROM to -5 to -7 degrees extension and 120-125 deg flexion 01/10/16   Time 12   Period Weeks   Status New   PT LONG TERM GOAL #3   Title Patient reports return to normal functional activities including sitting; standing; walking for 30-60 min without difficulty 01/10/16   Time 12   Period Weeks   Status New   PT LONG TERM GOAL #4   Title I in HEP 01/10/16   Time 12   Period Weeks   Status Revised   PT LONG TERM GOAL #5   Title Improve FOTO to </= 49% limitation 01/10/16   Time 12   Period Weeks   Status New               Plan - 10/25/15 1005    Clinical Impression Statement Pt continues with gait deviations, but can safely ambulate without AD without any LOB or Lt knee buckling. Pt tolerated exercises with mild/moderate increase in pain; reduced with use of estim and vaso at end of session.  Pt demonstrated improved Lt knee flexion this visit.  Progressing towards goals.    Pt will benefit from skilled therapeutic intervention in order to improve on the following deficits Postural  dysfunction;Improper body mechanics;Abnormal gait;Increased fascial restricitons;Decreased range of motion;Decreased mobility;Decreased strength;Decreased endurance;Decreased activity tolerance;Pain   PT Frequency 3x / week   PT Duration 12 weeks   PT Next Visit Plan progress stretching for calf/quads/hamstrings/IT band; manual work and modalities to address musclar tightness through Lt LE; gait training    Consulted and Agree with Plan of Care Patient        Problem List Patient Active Problem List   Diagnosis Date Noted  . 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 Status post revision of total replacement of left knee 09/20/2015  . 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 Effusion of left knee joint; questionable prosthetic joint infection 05/07/2015  . Knee effusion 05/07/2015  . Status post total left knee replacement 01/08/2015  . Osteoarthritis of left knee 12/25/2014  . Lumbar spondylosis L2-5 09/14/2012   Mayer Camel, PTA 10/25/2015 1:09 PM  [facility] [place] [address] [place], [place], [address] Phone: [phone]   Fax:  [phone]  Name: [name] MRN: [id number] Date of Birth: [birth year]

## 2015-10-27 ENCOUNTER — Ambulatory Visit (INDEPENDENT_AMBULATORY_CARE_PROVIDER_SITE_OTHER): Payer: Medicare HMO | Admitting: Physical Therapy

## 2015-10-27 DIAGNOSIS — M25662 Stiffness of left knee, not elsewhere classified: Secondary | ICD-10-CM | POA: Diagnosis not present

## 2015-10-27 DIAGNOSIS — R269 Unspecified abnormalities of gait and mobility: Secondary | ICD-10-CM

## 2015-10-27 DIAGNOSIS — R531 Weakness: Secondary | ICD-10-CM

## 2015-10-27 DIAGNOSIS — M25562 Pain in left knee: Secondary | ICD-10-CM | POA: Diagnosis not present

## 2015-10-27 DIAGNOSIS — R29898 Other symptoms and signs involving the musculoskeletal system: Secondary | ICD-10-CM | POA: Diagnosis not present

## 2015-10-27 DIAGNOSIS — Z7409 Other reduced mobility: Secondary | ICD-10-CM

## 2015-10-27 NOTE — Therapy (Signed)
[facility] [place] [address] [place], [place], [address] Phone: [phone]   Fax:  [phone]  Physical Therapy Treatment  Patient Details  Name: [name] MRN: [id number] Date of Birth: 06-11-1956 Referring Provider: Dr. Allie Bossier   Encounter Date: 10/27/2015      PT End of Session - 10/27/15 0939    Visit Number 4   Number of Visits 36   Date for PT Re-Evaluation 01/10/16   PT Start Time 0928   PT Stop Time 1032   PT Time Calculation (min) 64 min   Activity Tolerance Patient tolerated treatment well;Patient limited by pain      Past Medical History  Diagnosis Date  . OCD (obsessive compulsive disorder)   . Anxiety   . History of kidney stones     x1 15 years ago  . Restless leg syndrome     takes requip daily  . Cataract     left eye  . Headache     hx of migraines-none recent  . Rheumatoid arthritis(714.0)     sees Dr Sharmon Revere @ [facility] in [place]  . Depression     takes Cymbalta daily  . ADD (attention deficit disorder)     takes Ritalin daily  . GERD (gastroesophageal reflux disease)     takes Omeprazole daily  . Enlarged prostate     takes Flomax daily   . Numbness and tingling     fingers bilat comes and goes   . Wears glasses   . History of blood transfusion   . Kidney stones   . Anemia     Past Surgical History  Procedure Laterality Date  . Total hip arthroplasty Right 01/2012    done in [facility]  . Hand tendon surgery Right 06/2012    done @ [facility] in [place]  . Rotator cuff repair Right 2008     done in [facility]  . Anterior lumbar fusion  09/13/2012    Procedure: ANTERIOR LUMBAR FUSION 1 LEVEL;  Surgeon: Reinaldo Meeker, MD;  [facility];  Service: Neurosurgery;  Laterality: N/A;  lumbar four-five  . Anterior lat lumbar fusion  09/13/2012    Procedure: ANTERIOR LATERAL LUMBAR FUSION 2 LEVELS;  Surgeon: Reinaldo Meeker, MD;  [facility];  Service:  Neurosurgery;  Laterality: Right;  lumbar two three,three-four  . Lumbar percutaneous pedicle screw 2 level  09/13/2012    Procedure: LUMBAR PERCUTANEOUS PEDICLE SCREW 2 LEVEL;  Surgeon: Reinaldo Meeker, MD;  [facility]: [facility];  Service: Neurosurgery;  Laterality: Left;  left pedicle screws two -three,three-four  . Abdominal exposure  09/13/2012    Procedure: ABDOMINAL EXPOSURE;  Surgeon: Larina Earthly, MD;  [facility]: [facility];  Service: Vascular;  Laterality: N/A;  . Total knee arthroplasty Left 01/08/2015    Procedure: LEFT TOTAL KNEE ARTHROPLASTY;  Surgeon: Kathryne Hitch, MD;  Location: [facility];  Service: Orthopedics;  Laterality: Left;  . I&d knee with poly exchange Left 05/07/2015    Procedure: IRRIGATION AND DEBRIDEMENT LEFT KNEE WITH POLY EXCHANGE;  Surgeon: Kathryne Hitch, MD;  Location: [facility];  Service: Orthopedics;  Laterality: Left;  . Knee arthroscopy Left 06/22/2015    Procedure: LEFT KNEE ARTHROSCOPY WITH DEBRIDEMENT, SYNOVECTOMY;  Surgeon: Kathryne Hitch, MD;  Location: [facility] OR;  Service: Orthopedics;  Laterality: Left;  . Excisional total knee arthroplasty with antibiotic spacers Left 08/06/2015    Procedure: EXCISION OF LEFT TOTAL KNEE ARTHROPLASTY  WITH PLACEMENT OF ANTIBIOTIC SPACERS;  Surgeon: Kathryne Hitch, MD;  Location: [facility];  Service: Orthopedics;  Laterality: Left;  . Total knee revision Left 09/20/2015    Procedure: RE-IMPLANT/REVISION LEFT TOTAL KNEE ARTHROPLASTY;  Surgeon: Kathryne Hitch, MD;  Location: [facility] OR;  Service: Orthopedics;  Laterality: Left;    There were no vitals filed for this visit.  Visit Diagnosis:  Knee pain, left  Stiffness of knee joint, left  Weakness of left lower extremity  Abnormal gait  Decreased strength, endurance, and mobility      Subjective Assessment - 10/27/15 0937    Subjective Pt reports he continues to have "zingers" in Lt knee.   Currently in Pain? Yes   Pain Score 2   up to  8/10    Pain Location Knee   Pain Orientation Left   Pain Descriptors / Indicators Aching            [facility] PT Assessment - 10/27/15 0001    AROM   Left Knee Extension -2  with quad set          [facility] Adult PT Treatment/Exercise - 10/27/15 0001    Knee/Hip Exercises: Stretches   Quad Stretch Left;3 reps;30 seconds   Gastroc Stretch Left;2 reps;30 seconds   Knee/Hip Exercises: Aerobic   Stationary Bike partial to full revolutions, 6 min    Knee/Hip Exercises: Standing   Forward Step Up Left;1 set;5 reps;Hand Hold: 2;Step Height: 6"   SLS Lt SLS x 15 sec; repeated 2x with foot on blue pad.     Other Standing Knee Exercises marching with toe taps to 6" step x 20; mirror and tactile cues to keep Lt hip down.     Knee/Hip Exercises: Seated   Other Seated Knee/Hip Exercises seated scoots on black mat table to increase Lt knee flexion ROM x 10 reps x 10 sec hold.    Knee/Hip Exercises: Supine   Heel Slides AAROM;Left;5 reps   Straight Leg Raises Left;1 set;10 reps   Straight Leg Raise with External Rotation Strengthening;Left;1 set;10 reps   Knee/Hip Exercises: Prone   Hamstring Curl 2 sets;10 reps;1 second   Prone Knee Hang 2 minutes  2 reps    Electrical Stimulation   Electrical Stimulation Location Lt knee    Electrical Stimulation Action IFC   Electrical Stimulation Parameters to tolerance    Electrical Stimulation Goals Edema;Pain   Vasopneumatic   Number Minutes Vasopneumatic  15 minutes   Vasopnuematic Location  Knee   Vasopneumatic Pressure Medium   Vasopneumatic Temperature  3*   Manual Therapy   Manual Therapy Taping;Joint mobilization   Joint Mobilization patella mobilizations in all directions   Kinesiotex Edema   Kinesiotix   Edema web pattern placed across Lt knee to decrease swelling / decompress tissues.  Decompression strip placed at lateral Lt hamstring attachment to decrease pain.                 PT Education - 10/27/15 1004    Education  provided Yes   Education Details HEP and how to perform scar massage and patella mobilizations   Person(s) Educated Patient   Methods Explanation;Handout   Comprehension Verbalized understanding          PT Short Term Goals - 10/20/15 1029    PT SHORT TERM GOAL #1   Title Instruct pt in appropriate gait pattern with appropriate assistive device 11/29/15   Time 6   Period Weeks   Status On-going  PT SHORT TERM GOAL #2   Title Increase Lt knee ext to -8 to -6 deg 11/29/15   Time 6   Period Weeks   Status Achieved   PT SHORT TERM GOAL #3   Title Increase Lt knee flexion to 110-115 deg 11/29/15   Time 6   Period Weeks   Status On-going   PT SHORT TERM GOAL #4   Title Increase strength Lt LE to 4+/5 throughout 11/29/15   Time 6   Period Weeks   Status On-going           PT Long Term Goals - 10/18/15 1716    PT LONG TERM GOAL #1   Title Improve gait pattern with patient to demonstrate normal gait with minimal limp with appropriate assistive device 01/10/16   Time 12   Period Weeks   Status New   PT LONG TERM GOAL #2   Title Increase knee ROM to -5 to -7 degrees extension and 120-125 deg flexion 01/10/16   Time 12   Period Weeks   Status New   PT LONG TERM GOAL #3   Title Patient reports return to normal functional activities including sitting; standing; walking for 30-60 min without difficulty 01/10/16   Time 12   Period Weeks   Status New   PT LONG TERM GOAL #4   Title I in HEP 01/10/16   Time 12   Period Weeks   Status Revised   PT LONG TERM GOAL #5   Title Improve FOTO to </= 49% limitation 01/10/16   Time 12   Period Weeks   Status New               Plan - 10/27/15 1043    Clinical Impression Statement Pt has difficulty tolerating many ROM exercises due to increased pain up to 8/10 "zingers".  Pt demonstrated improved Lt knee extension this visit.     Pt will benefit from skilled therapeutic intervention in order to improve on the following deficits  Postural dysfunction;Improper body mechanics;Abnormal gait;Increased fascial restricitons;Decreased range of motion;Decreased mobility;Decreased strength;Decreased endurance;Decreased activity tolerance;Pain   Rehab Potential Good   PT Frequency 3x / week   PT Duration 12 weeks   PT Treatment/Interventions Patient/family education;ADLs/Self Care Home Management;Therapeutic exercise;Therapeutic activities;Manual techniques;Neuromuscular re-education;Dry needling;Cryotherapy;Electrical Stimulation;Moist Heat;Ultrasound;Gait training;Functional mobility training;Balance training   PT Next Visit Plan progress stretching for calf/quads/hamstrings/IT band; manual work and modalities to address musclar tightness through Lt LE; gait training         Problem List Patient Active Problem List   Diagnosis Date Noted  . 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 Status post revision of total replacement of left knee 09/20/2015  . 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 Effusion of left knee joint; questionable prosthetic joint infection 05/07/2015  . Knee effusion 05/07/2015  . Status post total left knee replacement 01/08/2015  . Osteoarthritis of left knee 12/25/2014  . Lumbar spondylosis L2-5 09/14/2012    Mayer Camel, PTA 10/27/2015 12:22 PM  [facility] [place] [address] [place], [place], [address] Phone: [phone]   Fax:  [phone]  Name: [name] MRN: [id number] Date of Birth: [birth year]

## 2015-10-27 NOTE — Patient Instructions (Signed)
KNEE: Knee Hang - Prone    Lie on stomach. Place towel above knee; hang feet off surface. Keep feet straight. Hold _1-3__ min . Can have knee caps off bed for less pressure on knees.  This is to help stretch the hamstrings on back of knee.    Self-Mobilization: Knee Flexion (Prone)    Bring left heel toward buttocks as close as possible. Hold _1___ seconds. Relax. Repeat _10___ times per set. Do __2__ sets per session. Do __1-2__ sessions per day.  http://[email or web address].us/596   KNEE: Quadriceps - Prone    Place strap around ankle. Bring ankle toward buttocks. Press hip into surface. Hold _20-30__ seconds. _2__ reps per set, _2__ sets per day, __5-7_ days per week This is to stretch the quadriceps muscles on front of thigh.    Knee Wall Slide    Slowly "walk" or slide feet on wall toward floor until stretch is felt in knees. Repeat _10___ times per set. Do _1-2___ sets per session. Do _2___ sessions per day.  http://orth.exer.us/674    [facility] at [facility] [address] [place], [place] [address]  [phone] (office) [phone] (fax)

## 2015-10-29 ENCOUNTER — Ambulatory Visit (INDEPENDENT_AMBULATORY_CARE_PROVIDER_SITE_OTHER): Payer: Medicare HMO | Admitting: Physical Therapy

## 2015-10-29 DIAGNOSIS — R269 Unspecified abnormalities of gait and mobility: Secondary | ICD-10-CM

## 2015-10-29 DIAGNOSIS — R29898 Other symptoms and signs involving the musculoskeletal system: Secondary | ICD-10-CM | POA: Diagnosis not present

## 2015-10-29 DIAGNOSIS — M25662 Stiffness of left knee, not elsewhere classified: Secondary | ICD-10-CM

## 2015-10-29 DIAGNOSIS — R6889 Other general symptoms and signs: Secondary | ICD-10-CM

## 2015-10-29 DIAGNOSIS — M25562 Pain in left knee: Secondary | ICD-10-CM | POA: Diagnosis not present

## 2015-10-29 DIAGNOSIS — Z7409 Other reduced mobility: Secondary | ICD-10-CM

## 2015-10-29 DIAGNOSIS — R531 Weakness: Secondary | ICD-10-CM

## 2015-10-29 NOTE — Therapy (Signed)
[place] [address] [place], [place], [address] Phone: [phone]   Fax:  [phone]  Physical Therapy Treatment  Patient Details  Name: [name] MRN: [id number] Date of Birth: 10/10/1955 Referring Provider: Dr. Zollie Beckers   Encounter Date: 10/29/2015      PT End of Session - 10/29/15 0937    Visit Number 5   Number of Visits 36   Date for PT Re-Evaluation 01/10/16   PT Start Time 0936  pt arrived late   PT Stop Time 1034   PT Time Calculation (min) 58 min   Activity Tolerance Patient tolerated treatment well      Past Medical History  Diagnosis Date  . OCD (obsessive compulsive disorder)   . Anxiety   . History of kidney stones     x1 15 years ago  . Restless leg syndrome     takes requip daily  . Cataract     left eye  . Headache     hx of migraines-none recent  . Rheumatoid arthritis(714.0)     sees Dr Gerilyn Nestle @ [facility] in [place]  . Depression     takes Cymbalta daily  . ADD (attention deficit disorder)     takes Ritalin daily  . GERD (gastroesophageal reflux disease)     takes Omeprazole daily  . Enlarged prostate     takes Flomax daily   . Numbness and tingling     fingers bilat comes and goes   . Wears glasses   . History of blood transfusion   . Kidney stones   . Anemia     Past Surgical History  Procedure Laterality Date  . Total hip arthroplasty Right 01/2012    done in [facility]  . Hand tendon surgery Right 06/2012    done @ [facility] in [place]  . Rotator cuff repair Right 2008     done in [facility]  . Anterior lumbar fusion  09/13/2012    Procedure: ANTERIOR LUMBAR FUSION 1 LEVEL;  Surgeon: Faythe Ghee, MD;  Location: [facility];  Service: Neurosurgery;  Laterality: N/A;  lumbar four-five  . Anterior lat lumbar fusion  09/13/2012    Procedure: ANTERIOR LATERAL LUMBAR FUSION 2 LEVELS;  Surgeon: Faythe Ghee, MD;  Location: [place];  Service:  Neurosurgery;  Laterality: Right;  lumbar two three,three-four  . Lumbar percutaneous pedicle screw 2 level  09/13/2012    Procedure: LUMBAR PERCUTANEOUS PEDICLE SCREW 2 LEVEL;  Surgeon: Faythe Ghee, MD;  Location: [facility];  Service: Neurosurgery;  Laterality: Left;  left pedicle screws two -three,three-four  . Abdominal exposure  09/13/2012    Procedure: ABDOMINAL EXPOSURE;  Surgeon: Rosetta Posner, MD;  Location: [facility];  Service: Vascular;  Laterality: N/A;  . Total knee arthroplasty Left 01/08/2015    Procedure: LEFT TOTAL KNEE ARTHROPLASTY;  Surgeon: Mcarthur Rossetti, MD;  Location: [facility];  Service: Orthopedics;  Laterality: Left;  . I&d knee with poly exchange Left 05/07/2015    Procedure: IRRIGATION AND DEBRIDEMENT LEFT KNEE WITH POLY EXCHANGE;  Surgeon: Mcarthur Rossetti, MD;  Location: [facility];  Service: Orthopedics;  Laterality: Left;  . Knee arthroscopy Left 06/22/2015    Procedure: LEFT KNEE ARTHROSCOPY WITH DEBRIDEMENT, SYNOVECTOMY;  Surgeon: Mcarthur Rossetti, MD;  Location: [facility];  Service: Orthopedics;  Laterality: Left;  . Excisional total knee arthroplasty with antibiotic spacers Left 08/06/2015    Procedure: EXCISION OF LEFT TOTAL KNEE  ARTHROPLASTY WITH PLACEMENT OF ANTIBIOTIC SPACERS;  Surgeon: Mcarthur Rossetti, MD;  [facility];  Service: Orthopedics;  Laterality: Left;  . Total knee revision Left 09/20/2015    Procedure: RE-IMPLANT/REVISION LEFT TOTAL KNEE ARTHROPLASTY;  Surgeon: Mcarthur Rossetti, MD;  Location: [facility];  Service: Orthopedics;  Laterality: Left;    There were no vitals filed for this visit.  Visit Diagnosis:  Knee pain, left  Stiffness of knee joint, left  Weakness of left lower extremity  Abnormal gait  Decreased strength, endurance, and mobility      Subjective Assessment - 10/29/15 0938    Subjective Pt reports he tried the prone knee hang Wednesday night and "had the worse zinger ever".  Pt ambulates into  therapy without assistive device.    Currently in Pain? No/denies  up to 8/10 when bending knee    Pain Location Knee   Pain Orientation Left            [facility] PT Assessment - 10/29/15 0001    Assessment   Medical Diagnosis Lt TKA   Referring Provider Dr. Zollie Beckers    Onset Date/Surgical Date 09/20/15  first TKA 01/08/15   Hand Dominance Right   Next MD Visit 11/01/15   Prior Therapy home health and hospital based    AROM   Left Knee Extension -2  with quad set   Left Knee Flexion 110  with seated scoot                     [facility] Adult PT Treatment/Exercise - 10/29/15 0001    Knee/Hip Exercises: Stretches   Quad Stretch Left;3 reps;60 seconds   Gastroc Stretch Left;2 reps;30 seconds   Soleus Stretch Left;2 reps;30 seconds   Knee/Hip Exercises: Aerobic   Stationary Bike partial to full revolutions, able to turn bike on at 5 min .    Knee/Hip Exercises: Standing   Heel Raises Both;2 sets;10 reps;1 second   Lateral Step Up Left;1 set;10 reps;Hand Hold: 2  3"; 6" too difficult   Forward Step Up Left;1 set;10 reps;Hand Hold: 2;Step Height: 6"   Step Down Right;1 set;10 reps;Hand Hold: 2  3"   Step Down Limitations (challenging)   SLS Lt SLS without UE support with horizontal head turns    Knee/Hip Exercises: Seated   Other Seated Knee/Hip Exercises seated scoots on black mat table to increase Lt knee flexion ROM x 10 reps x 10 sec hold.    Sit to Sand 10 reps;without UE support  VC for even weight and controlled descent. challenge!   Knee/Hip Exercises: Prone   Hamstring Curl 2 sets;10 reps  challenging, moves slow   Prone Knee Hang 1 minute  2 reps    Electrical Stimulation   Electrical Stimulation Location Lt knee    Electrical Stimulation Action IFC   Electrical Stimulation Parameters to tolerance    Electrical Stimulation Goals Edema;Pain   Vasopneumatic   Number Minutes Vasopneumatic  15 minutes   Vasopnuematic Location  Knee   Vasopneumatic  Pressure Medium   Vasopneumatic Temperature  3*                  PT Short Term Goals - 10/29/15 1010    PT SHORT TERM GOAL #1   Title Instruct pt in appropriate gait pattern with appropriate assistive device 11/29/15   Time 6   Period Weeks   Status Achieved   PT SHORT TERM GOAL #2   Title Increase Lt  knee ext to -8 to -6 deg 11/29/15   Time 6   Period Weeks   Status Achieved   PT SHORT TERM GOAL #3   Title Increase Lt knee flexion to 110-115 deg 11/29/15   Time 6   Period Weeks   Status Achieved   PT SHORT TERM GOAL #4   Title Increase strength Lt LE to 4+/5 throughout 11/29/15   Time 6   Period Weeks   Status On-going           PT Long Term Goals - 10/29/15 1009    PT LONG TERM GOAL #1   Title Improve gait pattern with patient to demonstrate normal gait with minimal limp with appropriate assistive device 01/10/16   Time 12   Period Weeks   Status On-going   PT LONG TERM GOAL #2   Title Increase knee ROM to -5 to -7 degrees extension and 120-125 deg flexion 01/10/16   Time 12   Period Weeks   Status On-going   PT LONG TERM GOAL #3   Title Patient reports return to normal functional activities including sitting; standing; walking for 30-60 min without difficulty 01/10/16   Time 12   Period Weeks   Status On-going   PT LONG TERM GOAL #4   Title I in HEP 01/10/16   Time 12   Period Weeks   Status On-going   PT LONG TERM GOAL #5   Title Improve FOTO to </= 49% limitation 01/10/16   Time 12   Period Weeks   Status On-going               Plan - 10/29/15 1022    Clinical Impression Statement Pt demonstrated improved Lt knee ROM; has met LTG #3 and 1. Pt tolerated all exercises with mild increase in pain. Pt making good progress toward established goals.    Pt will benefit from skilled therapeutic intervention in order to improve on the following deficits Postural dysfunction;Improper body mechanics;Abnormal gait;Increased fascial restricitons;Decreased  range of motion;Decreased mobility;Decreased strength;Decreased endurance;Decreased activity tolerance;Pain   Rehab Potential Good   PT Frequency 3x / week   PT Duration 12 weeks   PT Treatment/Interventions Patient/family education;ADLs/Self Care Home Management;Therapeutic exercise;Therapeutic activities;Manual techniques;Neuromuscular re-education;Dry needling;Cryotherapy;Electrical Stimulation;Moist Heat;Ultrasound;Gait training;Functional mobility training;Balance training   PT Next Visit Plan Cotninue progressive strengthening/stretching to Lt knee.  Add proprioceptive training to HEP.    Consulted and Agree with Plan of Care Patient        Problem List Patient Active Problem List   Diagnosis Date Noted  . 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 Status post revision of total replacement of left knee 09/20/2015  . Infection of total left knee replacement ([place]) 08/06/2015  . Effusion of left knee joint; questionable prosthetic joint infection 05/07/2015  . Knee effusion 05/07/2015  . Status post total left knee replacement 01/08/2015  . Osteoarthritis of left knee 12/25/2014  . Lumbar spondylosis L2-5 09/14/2012   Kerin Perna, PTA 10/29/2015 10:29 AM  [facility], [place], [address] Phone: [phone]   Fax:  [phone]  Name: Lucille Crichlow MRN: [id number] Date of Birth: 05/27/1956

## 2015-11-01 ENCOUNTER — Ambulatory Visit (INDEPENDENT_AMBULATORY_CARE_PROVIDER_SITE_OTHER): Payer: Medicare HMO | Admitting: Physical Therapy

## 2015-11-01 DIAGNOSIS — Z7409 Other reduced mobility: Secondary | ICD-10-CM

## 2015-11-01 DIAGNOSIS — M25562 Pain in left knee: Secondary | ICD-10-CM | POA: Diagnosis not present

## 2015-11-01 DIAGNOSIS — R29898 Other symptoms and signs involving the musculoskeletal system: Secondary | ICD-10-CM | POA: Diagnosis not present

## 2015-11-01 DIAGNOSIS — M25662 Stiffness of left knee, not elsewhere classified: Secondary | ICD-10-CM

## 2015-11-01 DIAGNOSIS — R269 Unspecified abnormalities of gait and mobility: Secondary | ICD-10-CM | POA: Diagnosis not present

## 2015-11-01 DIAGNOSIS — R531 Weakness: Secondary | ICD-10-CM

## 2015-11-01 NOTE — Therapy (Signed)
[place], [place], [address] Phone: [phone]   Fax:  [phone]  Physical Therapy Treatment  Patient Details  Name: [name] MRN: [id number] Date of Birth: Oct 07, 1955 Referring Provider: Dr. Zollie Beckers   Encounter Date: 11/01/2015      PT End of Session - 11/01/15 1023    Visit Number 6   Number of Visits 36   Date for PT Re-Evaluation 01/10/16   PT Start Time 2637   PT Stop Time 1118   PT Time Calculation (min) 63 min   Activity Tolerance Patient tolerated treatment well   Behavior During Therapy [facility] for tasks assessed/performed      Past Medical History  Diagnosis Date  . OCD (obsessive compulsive disorder)   . Anxiety   . History of kidney stones     x1 15 years ago  . Restless leg syndrome     takes requip daily  . Cataract     left eye  . Headache     hx of migraines-none recent  . Rheumatoid arthritis(714.0)     sees Dr Gerilyn Nestle @ [facility] in [place]  . Depression     takes Cymbalta daily  . ADD (attention deficit disorder)     takes Ritalin daily  . GERD (gastroesophageal reflux disease)     takes Omeprazole daily  . Enlarged prostate     takes Flomax daily   . Numbness and tingling     fingers bilat comes and goes   . Wears glasses   . History of blood transfusion   . Kidney stones   . Anemia     Past Surgical History  Procedure Laterality Date  . Total hip arthroplasty Right 01/2012    done in [facility]  . Hand tendon surgery Right 06/2012    done @ [facility] in [place]  . Rotator cuff repair Right 2008     done in [facility]  . Anterior lumbar fusion  09/13/2012    Procedure: ANTERIOR LUMBAR FUSION 1 LEVEL;  Surgeon: Faythe Ghee, MD;  Location: [place];  Service: Neurosurgery;  Laterality: N/A;  lumbar four-five  . Anterior lat lumbar fusion  09/13/2012    Procedure: ANTERIOR LATERAL LUMBAR FUSION 2 LEVELS;  Surgeon: Faythe Ghee, MD;   Location: [place] [facility];  Service: Neurosurgery;  Laterality: Right;  lumbar two three,three-four  . Lumbar percutaneous pedicle screw 2 level  09/13/2012    Procedure: LUMBAR PERCUTANEOUS PEDICLE SCREW 2 LEVEL;  Surgeon: Faythe Ghee, MD;  Location: [facility];  Service: Neurosurgery;  Laterality: Left;  left pedicle screws two -three,three-four  . Abdominal exposure  09/13/2012    Procedure: ABDOMINAL EXPOSURE;  Surgeon: Rosetta Posner, MD;  Location: [facility];  Service: Vascular;  Laterality: N/A;  . Total knee arthroplasty Left 01/08/2015    Procedure: LEFT TOTAL KNEE ARTHROPLASTY;  Surgeon: Mcarthur Rossetti, MD;  [facility];  Service: Orthopedics;  Laterality: Left;  . I&d knee with poly exchange Left 05/07/2015    Procedure: IRRIGATION AND DEBRIDEMENT LEFT KNEE WITH POLY EXCHANGE;  Surgeon: Mcarthur Rossetti, MD;  [facility];  Service: Orthopedics;  Laterality: Left;  . Knee arthroscopy Left 06/22/2015    Procedure: LEFT KNEE ARTHROSCOPY WITH DEBRIDEMENT, SYNOVECTOMY;  Surgeon: Mcarthur Rossetti, MD;  Location: [facility];  Service: Orthopedics;  Laterality: Left;  . Excisional total knee arthroplasty with antibiotic spacers Left 08/06/2015    Procedure:  EXCISION OF LEFT TOTAL KNEE ARTHROPLASTY WITH PLACEMENT OF ANTIBIOTIC SPACERS;  Surgeon: Mcarthur Rossetti, MD;  [facility];  Service: Orthopedics;  Laterality: Left;  . Total knee revision Left 09/20/2015    Procedure: RE-IMPLANT/REVISION LEFT TOTAL KNEE ARTHROPLASTY;  Surgeon: Mcarthur Rossetti, MD;  Location: [place];  Service: Orthopedics;  Laterality: Left;    There were no vitals filed for this visit.  Visit Diagnosis:  Knee pain, left  Stiffness of knee joint, left  Weakness of left lower extremity  Abnormal gait  Decreased strength, endurance, and mobility      Subjective Assessment - 11/01/15 1023    Currently in Pain? Yes   Pain Score 3    Pain Location Knee   Pain Orientation  Left   Pain Descriptors / Indicators Aching;Dull   Aggravating Factors  prolonged standing    Pain Relieving Factors changing positions, ice             [facility] PT Assessment - 11/01/15 0001    Assessment   Medical Diagnosis Lt TKA   Referring Provider Dr. Zollie Beckers    Onset Date/Surgical Date 09/20/15  first TKA 01/08/15   Hand Dominance Right   Next MD Visit 11/01/15   AROM   Left Knee Extension -2  with quad set   Left Knee Flexion 112  with seated scoot   Strength   Left Hip Flexion 5/5   Left Hip Extension 4+/5   Left Hip ABduction --  5-/5   Left Hip ADduction --  5-/5   Left Knee Flexion 4+/5   Left Knee Extension --  5-/5         [facility] Adult PT Treatment/Exercise - 11/01/15 0001    Knee/Hip Exercises: Stretches   Quad Stretch Left;3 reps;60 seconds   Gastroc Stretch Left;2 reps;30 seconds   Knee/Hip Exercises: Aerobic   Stationary Bike bike L1: 5 min    Knee/Hip Exercises: Standing   Lateral Step Up Left;5 reps;2 sets;Hand Hold: 2;Step Height: 6"   Lateral Step Up Limitations Improved form.    Forward Step Up Left;1 set;10 reps;Step Height: 6"   Forward Step Up Limitations mirror for visual feedback;    Wall Squat 1 set;10 reps   SLS Lt SLS with Rt toe taps front/side/ back x 5 reps; repeated with Lt foot on blue pad.    Knee/Hip Exercises: Seated   Other Seated Knee/Hip Exercises seated scoots on black mat table to increase Lt knee flexion ROM x 10 reps x 10 sec hold.    Knee/Hip Exercises: Supine   Heel Slides AAROM;Left;5 reps   Knee/Hip Exercises: Prone   Hip Extension Limitations TKE LLE x 5 sec x 10    Electrical Stimulation   Electrical Stimulation Location Lt knee    Electrical Stimulation Action IFC   Electrical Stimulation Parameters to tolerance    Electrical Stimulation Goals Edema;Pain   Vasopneumatic   Number Minutes Vasopneumatic  15 minutes   Vasopnuematic Location  Knee   Vasopneumatic Pressure Medium   Vasopneumatic  Temperature  3*                  PT Short Term Goals - 11/01/15 1107    PT SHORT TERM GOAL #1   Title Instruct pt in appropriate gait pattern with appropriate assistive device 11/29/15   Time 6   Period Weeks   Status Achieved   PT SHORT TERM GOAL #2   Title Increase Lt knee ext to -8  to -6 deg 11/29/15   Time 6   Period Weeks   Status Achieved   PT SHORT TERM GOAL #3   Title Increase Lt knee flexion to 110-115 deg 11/29/15   Time 6   Period Weeks   Status Achieved   PT SHORT TERM GOAL #4   Title Increase strength Lt LE to 4+/5 throughout 11/29/15   Time 6   Period Weeks   Status Achieved           PT Long Term Goals - 10/29/15 1009    PT LONG TERM GOAL #1   Title Improve gait pattern with patient to demonstrate normal gait with minimal limp with appropriate assistive device 01/10/16   Time 12   Period Weeks   Status On-going   PT LONG TERM GOAL #2   Title Increase knee ROM to -5 to -7 degrees extension and 120-125 deg flexion 01/10/16   Time 12   Period Weeks   Status On-going   PT LONG TERM GOAL #3   Title Patient reports return to normal functional activities including sitting; standing; walking for 30-60 min without difficulty 01/10/16   Time 12   Period Weeks   Status On-going   PT LONG TERM GOAL #4   Title I in HEP 01/10/16   Time 12   Period Weeks   Status On-going   PT LONG TERM GOAL #5   Title Improve FOTO to </= 49% limitation 01/10/16   Time 12   Period Weeks   Status On-going               Plan - 11/01/15 1043    Clinical Impression Statement Pt demonstrated improved Lt knee ROM and improved strength in LLE.  Pt tolerated steps with greater ease today, "less zingers"; tolerated all exercises with mild increase in pain. Pt has met STG #4 and is  making good progress towards established goals.     Pt will benefit from skilled therapeutic intervention in order to improve on the following deficits Postural dysfunction;Improper body  mechanics;Abnormal gait;Increased fascial restricitons;Decreased range of motion;Decreased mobility;Decreased strength;Decreased endurance;Decreased activity tolerance;Pain   Rehab Potential Good   PT Frequency 3x / week   PT Duration 12 weeks   PT Treatment/Interventions Patient/family education;ADLs/Self Care Home Management;Therapeutic exercise;Therapeutic activities;Manual techniques;Neuromuscular re-education;Dry needling;Cryotherapy;Electrical Stimulation;Moist Heat;Ultrasound;Gait training;Functional mobility training;Balance training   PT Next Visit Plan Cotninue progressive strengthening/stretching to Lt knee.  Add hamstring strengthening to HEP.    Consulted and Agree with Plan of Care Patient        Problem List Patient Active Problem List   Diagnosis Date Noted  . 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 Status post revision of total replacement of left knee 09/20/2015  . Infection of total left knee replacement ([place]) 08/06/2015  . Effusion of left knee joint; questionable prosthetic joint infection 05/07/2015  . Knee effusion 05/07/2015  . Status post total left knee replacement 01/08/2015  . Osteoarthritis of left knee 12/25/2014  . Lumbar spondylosis L2-5 09/14/2012   Kerin Perna, PTA 11/01/2015 11:11 AM  [place], [place], [address] Phone: [phone]   Fax:  [phone]  Name: [name] MRN: [id number] Date of Birth: 03-30-1956

## 2015-11-03 ENCOUNTER — Ambulatory Visit (INDEPENDENT_AMBULATORY_CARE_PROVIDER_SITE_OTHER): Payer: Medicare HMO | Admitting: Physical Therapy

## 2015-11-03 DIAGNOSIS — M25662 Stiffness of left knee, not elsewhere classified: Secondary | ICD-10-CM | POA: Diagnosis not present

## 2015-11-03 DIAGNOSIS — M25562 Pain in left knee: Secondary | ICD-10-CM

## 2015-11-03 DIAGNOSIS — Z7409 Other reduced mobility: Secondary | ICD-10-CM

## 2015-11-03 DIAGNOSIS — R269 Unspecified abnormalities of gait and mobility: Secondary | ICD-10-CM

## 2015-11-03 DIAGNOSIS — R531 Weakness: Secondary | ICD-10-CM

## 2015-11-03 DIAGNOSIS — R29898 Other symptoms and signs involving the musculoskeletal system: Secondary | ICD-10-CM | POA: Diagnosis not present

## 2015-11-03 NOTE — Therapy (Signed)
[facility] [place] [address] [place], [place], [address] Phone: [phone]   Fax:  [phone]  Physical Therapy Treatment  Patient Details  Name: [name] MRN: [id number] Date of Birth: 24-Oct-1955 Referring Provider: Dr. Allie Bossier  Encounter Date: 11/03/2015      PT End of Session - 11/03/15 0944    Visit Number 7   Number of Visits 36   Date for PT Re-Evaluation 01/10/16   PT Start Time 0932   PT Stop Time 1037   PT Time Calculation (min) 65 min   Activity Tolerance Patient tolerated treatment well      Past Medical History  Diagnosis Date  . OCD (obsessive compulsive disorder)   . Anxiety   . History of kidney stones     x1 15 years ago  . Restless leg syndrome     takes requip daily  . Cataract     left eye  . Headache     hx of migraines-none recent  . Rheumatoid arthritis(714.0)     sees Dr Sharmon Revere @ [facility] in [place]  . Depression     takes Cymbalta daily  . ADD (attention deficit disorder)     takes Ritalin daily  . GERD (gastroesophageal reflux disease)     takes Omeprazole daily  . Enlarged prostate     takes Flomax daily   . Numbness and tingling     fingers bilat comes and goes   . Wears glasses   . History of blood transfusion   . Kidney stones   . Anemia     Past Surgical History  Procedure Laterality Date  . Total hip arthroplasty Right 01/2012    done in [facility]  . Hand tendon surgery Right 06/2012    done @ [place] in [place]  . Rotator cuff repair Right 2008     done in [facility]  . Anterior lumbar fusion  09/13/2012    Procedure: ANTERIOR LUMBAR FUSION 1 LEVEL;  Surgeon: Reinaldo Meeker, MD;  Location: [facility];  Service: Neurosurgery;  Laterality: N/A;  lumbar four-five  . Anterior lat lumbar fusion  09/13/2012    Procedure: ANTERIOR LATERAL LUMBAR FUSION 2 LEVELS;  Surgeon: Reinaldo Meeker, MD;  Location: [facility];  Service: Neurosurgery;  Laterality:  Right;  lumbar two three,three-four  . Lumbar percutaneous pedicle screw 2 level  09/13/2012    Procedure: LUMBAR PERCUTANEOUS PEDICLE SCREW 2 LEVEL;  Surgeon: Reinaldo Meeker, MD;  Location: [facility];  Service: Neurosurgery;  Laterality: Left;  left pedicle screws two -three,three-four  . Abdominal exposure  09/13/2012    Procedure: ABDOMINAL EXPOSURE;  Surgeon: Larina Earthly, MD;  Location: [facility];  Service: Vascular;  Laterality: N/A;  . Total knee arthroplasty Left 01/08/2015    Procedure: LEFT TOTAL KNEE ARTHROPLASTY;  Surgeon: Kathryne Hitch, MD;  Location: [facility];  Service: Orthopedics;  Laterality: Left;  . I&d knee with poly exchange Left 05/07/2015    Procedure: IRRIGATION AND DEBRIDEMENT LEFT KNEE WITH POLY EXCHANGE;  Surgeon: Kathryne Hitch, MD;  Location: [facility];  Service: Orthopedics;  Laterality: Left;  . Knee arthroscopy Left 06/22/2015    Procedure: LEFT KNEE ARTHROSCOPY WITH DEBRIDEMENT, SYNOVECTOMY;  Surgeon: Kathryne Hitch, MD;  Location: [facility] OR;  Service: Orthopedics;  Laterality: Left;  . Excisional total knee arthroplasty with antibiotic spacers Left 08/06/2015    Procedure: EXCISION OF LEFT TOTAL KNEE ARTHROPLASTY WITH PLACEMENT OF ANTIBIOTIC  SPACERS;  Surgeon: Kathryne Hitch, MD;  [facility]: [facility];  Service: Orthopedics;  Laterality: Left;  . Total knee revision Left 09/20/2015    Procedure: RE-IMPLANT/REVISION LEFT TOTAL KNEE ARTHROPLASTY;  Surgeon: Kathryne Hitch, MD;  Location: [facility] OR;  Service: Orthopedics;  Laterality: Left;    There were no vitals filed for this visit.  Visit Diagnosis:  Knee pain, left  Stiffness of knee joint, left  Abnormal gait  Weakness of left lower extremity  Decreased strength, endurance, and mobility      Subjective Assessment - 11/03/15 0944    Subjective Pt reports his visit with PA was positive; he was happy with pt's ROM.  Scheduled to return to MD in 4 wks.  "my knee feels flne, it's  just those zingers".    Currently in Pain? Yes   Pain Score 2    Pain Location Knee   Pain Orientation Left   Pain Descriptors / Indicators Aching;Dull   Aggravating Factors  prolonged standing    Pain Relieving Factors changing positions, ice.             [facility] PT Assessment - 11/03/15 0001    Assessment   Medical Diagnosis Lt TKA   Referring Provider Dr. Allie Bossier   Onset Date/Surgical Date 09/20/15  first TKA 01/08/15   Hand Dominance Right   Next MD Visit in 4 wks          [facility] Adult PT Treatment/Exercise - 11/03/15 0001    Knee/Hip Exercises: Stretches   Quad Stretch Left;60 seconds;2 reps   Gastroc Stretch Left;2 reps;30 seconds   Knee/Hip Exercises: Aerobic   Stationary Bike Partial revolutions x 2 min; L1: 5 min    Knee/Hip Exercises: Standing   Lateral Step Up Left;1 set;10 reps;Hand Hold: 1;Step Height: 6"   Lateral Step Up Limitations (still challenging)    Forward Step Up Left;1 set;10 reps;Step Height: 6"   Forward Step Up Limitations mirror for visual feedback;    Other Standing Knee Exercises Lunge on 13" step for stretching into increased flexion in Lt knee held 15 sec x 10 reps; followed by hamstring stretch x 10 sec.    Other Standing Knee Exercises Lt foot tap to 6" step x 10 with VC not to hike hip (mirror for visual feedback);    Knee/Hip Exercises: Seated   Long Arc Quad Strengthening;Left;1 set;10 reps  green band   Knee/Hip Exercises: Prone   Hamstring Curl 10 reps;1 second;1 set  1.5# x 10, then 2# x 3, stopped due to pain   Prone Knee Hang 1 minute  2# on ankle   Electrical Stimulation   Electrical Stimulation Location Lt knee    Electrical Stimulation Action ion repelling   Electrical Stimulation Parameters to tolerance    Electrical Stimulation Goals Edema;Pain   Vasopneumatic   Number Minutes Vasopneumatic  15 minutes   Vasopnuematic Location  Knee   Vasopneumatic Pressure Medium   Vasopneumatic Temperature  3*                 PT Education - 11/03/15 1029    Education provided Yes   Education Details HEP   Person(s) Educated Patient   Methods Explanation;Handout   Comprehension Verbalized understanding          PT Short Term Goals - 11/01/15 1107    PT SHORT TERM GOAL #1   Title Instruct pt in appropriate gait pattern with appropriate assistive device 11/29/15   Time 6  Period Weeks   Status Achieved   PT SHORT TERM GOAL #2   Title Increase Lt knee ext to -8 to -6 deg 11/29/15   Time 6   Period Weeks   Status Achieved   PT SHORT TERM GOAL #3   Title Increase Lt knee flexion to 110-115 deg 11/29/15   Time 6   Period Weeks   Status Achieved   PT SHORT TERM GOAL #4   Title Increase strength Lt LE to 4+/5 throughout 11/29/15   Time 6   Period Weeks   Status Achieved           PT Long Term Goals - 10/29/15 1009    PT LONG TERM GOAL #1   Title Improve gait pattern with patient to demonstrate normal gait with minimal limp with appropriate assistive device 01/10/16   Time 12   Period Weeks   Status On-going   PT LONG TERM GOAL #2   Title Increase knee ROM to -5 to -7 degrees extension and 120-125 deg flexion 01/10/16   Time 12   Period Weeks   Status On-going   PT LONG TERM GOAL #3   Title Patient reports return to normal functional activities including sitting; standing; walking for 30-60 min without difficulty 01/10/16   Time 12   Period Weeks   Status On-going   PT LONG TERM GOAL #4   Title I in HEP 01/10/16   Time 12   Period Weeks   Status On-going   PT LONG TERM GOAL #5   Title Improve FOTO to </= 49% limitation 01/10/16   Time 12   Period Weeks   Status On-going               Plan - 11/03/15 1024    Clinical Impression Statement Pt tolerated most exercises well, with mild increase in pain except prone hamstring curls with 2#.  Continues to make good gains towards establsihed goals.    Pt will benefit from skilled therapeutic intervention in order to  improve on the following deficits Postural dysfunction;Improper body mechanics;Abnormal gait;Increased fascial restricitons;Decreased range of motion;Decreased mobility;Decreased strength;Decreased endurance;Decreased activity tolerance;Pain   PT Frequency 3x / week   PT Duration 12 weeks   PT Treatment/Interventions Patient/family education;ADLs/Self Care Home Management;Therapeutic exercise;Therapeutic activities;Manual techniques;Neuromuscular re-education;Dry needling;Cryotherapy;Electrical Stimulation;Moist Heat;Ultrasound;Gait training;Functional mobility training;Balance training   PT Next Visit Plan Cotninue progressive strengthening/stretching to Lt knee.     Consulted and Agree with Plan of Care Patient        Problem List Patient Active Problem List   Diagnosis Date Noted  . 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 Status post revision of total replacement of left knee 09/20/2015  . 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 Effusion of left knee joint; questionable prosthetic joint infection 05/07/2015  . Knee effusion 05/07/2015  . Status post total left knee replacement 01/08/2015  . Osteoarthritis of left knee 12/25/2014  . Lumbar spondylosis L2-5 09/14/2012    Mayer Camel, PTA 11/03/2015 10:33 AM  [facility] [place] [address] [place], [place], [address] Phone: [phone]   Fax:  [phone]  Name: Jovaun Levene MRN: [id number] Date of Birth: [birth year]

## 2015-11-03 NOTE — Patient Instructions (Signed)
Hamstring Curl: Resisted (Sitting)    Facing anchor with tubing on right ankle, leg straight out, bend knee. Repeat _10___ times per set. Do __2__ sets per session. Do _1___ sessions per day.   http://orth.exer.us/668  KNEE: Extension, Long Arc Quad (Band)    Place band around leg and under other foot. Pull band forward until knee is straight. Hold _5__ seconds. Use ___green_____ band. _10__ reps per set, __1-2_ sets per day.   [facility] [address] [place], [place] [address]  [phone] (office) [phone] (fax)

## 2015-11-05 ENCOUNTER — Encounter: Payer: Self-pay | Admitting: Rehabilitative and Restorative Service Providers"

## 2015-11-05 ENCOUNTER — Ambulatory Visit (INDEPENDENT_AMBULATORY_CARE_PROVIDER_SITE_OTHER): Payer: Medicare HMO | Admitting: Rehabilitative and Restorative Service Providers"

## 2015-11-05 DIAGNOSIS — R29898 Other symptoms and signs involving the musculoskeletal system: Secondary | ICD-10-CM | POA: Diagnosis not present

## 2015-11-05 DIAGNOSIS — R269 Unspecified abnormalities of gait and mobility: Secondary | ICD-10-CM

## 2015-11-05 DIAGNOSIS — M25562 Pain in left knee: Secondary | ICD-10-CM | POA: Diagnosis not present

## 2015-11-05 DIAGNOSIS — Z7409 Other reduced mobility: Secondary | ICD-10-CM

## 2015-11-05 DIAGNOSIS — R531 Weakness: Secondary | ICD-10-CM

## 2015-11-05 DIAGNOSIS — M25662 Stiffness of left knee, not elsewhere classified: Secondary | ICD-10-CM

## 2015-11-05 DIAGNOSIS — R6889 Other general symptoms and signs: Secondary | ICD-10-CM

## 2015-11-05 NOTE — Therapy (Signed)
[facility] [place] [address] [place], [place], [address] Phone: [phone]   Fax:  [phone]  Physical Therapy Treatment  Patient Details  Name: [name] MRN: [id number] Date of Birth: 1955-11-30 Referring Provider: Dr. Allie Bossier  Encounter Date: 11/05/2015      PT End of Session - 11/05/15 1244    Visit Number 8   Number of Visits 36   Date for PT Re-Evaluation 01/10/16   PT Start Time 1147   PT Stop Time 1252   PT Time Calculation (min) 65 min   Activity Tolerance Patient tolerated treatment well      Past Medical History  Diagnosis Date  . OCD (obsessive compulsive disorder)   . Anxiety   . History of kidney stones     x1 15 years ago  . Restless leg syndrome     takes requip daily  . Cataract     left eye  . Headache     hx of migraines-none recent  . Rheumatoid arthritis(714.0)     sees Dr Sharmon Revere @ [facility] in [place]  . Depression     takes Cymbalta daily  . ADD (attention deficit disorder)     takes Ritalin daily  . GERD (gastroesophageal reflux disease)     takes Omeprazole daily  . Enlarged prostate     takes Flomax daily   . Numbness and tingling     fingers bilat comes and goes   . Wears glasses   . History of blood transfusion   . Kidney stones   . Anemia     Past Surgical History  Procedure Laterality Date  . Total hip arthroplasty Right 01/2012    done in [facility]  . Hand tendon surgery Right 06/2012    done @ [facility] in [place]  . Rotator cuff repair Right 2008     done in [facility]  . Anterior lumbar fusion  09/13/2012    Procedure: ANTERIOR LUMBAR FUSION 1 LEVEL;  Surgeon: Reinaldo Meeker, MD;  [facility]: [facility];  Service: Neurosurgery;  Laterality: N/A;  lumbar four-five  . Anterior lat lumbar fusion  09/13/2012    Procedure: ANTERIOR LATERAL LUMBAR FUSION 2 LEVELS;  Surgeon: Reinaldo Meeker, MD;  [facility]: [facility];  Service: Neurosurgery;  Laterality:  Right;  lumbar two three,three-four  . Lumbar percutaneous pedicle screw 2 level  09/13/2012    Procedure: LUMBAR PERCUTANEOUS PEDICLE SCREW 2 LEVEL;  Surgeon: Reinaldo Meeker, MD;  Location: [facility];  Service: Neurosurgery;  Laterality: Left;  left pedicle screws two -three,three-four  . Abdominal exposure  09/13/2012    Procedure: ABDOMINAL EXPOSURE;  Surgeon: Larina Earthly, MD;  [facility]: [facility];  Service: Vascular;  Laterality: N/A;  . Total knee arthroplasty Left 01/08/2015    Procedure: LEFT TOTAL KNEE ARTHROPLASTY;  Surgeon: Kathryne Hitch, MD;  [facility];  Service: Orthopedics;  Laterality: Left;  . I&d knee with poly exchange Left 05/07/2015    Procedure: IRRIGATION AND DEBRIDEMENT LEFT KNEE WITH POLY EXCHANGE;  Surgeon: Kathryne Hitch, MD;  Location: [facility];  Service: Orthopedics;  Laterality: Left;  . Knee arthroscopy Left 06/22/2015    Procedure: LEFT KNEE ARTHROSCOPY WITH DEBRIDEMENT, SYNOVECTOMY;  Surgeon: Kathryne Hitch, MD;  Location: [facility] OR;  Service: Orthopedics;  Laterality: Left;  . Excisional total knee arthroplasty with antibiotic spacers Left 08/06/2015    Procedure: EXCISION OF LEFT TOTAL KNEE ARTHROPLASTY WITH PLACEMENT OF ANTIBIOTIC  SPACERS;  Surgeon: Kathryne Hitch, MD;  [facility];  Service: Orthopedics;  Laterality: Left;  . Total knee revision Left 09/20/2015    Procedure: RE-IMPLANT/REVISION LEFT TOTAL KNEE ARTHROPLASTY;  Surgeon: Kathryne Hitch, MD;  Location: [facility] OR;  Service: Orthopedics;  Laterality: Left;    There were no vitals filed for this visit.  Visit Diagnosis:  Knee pain, left  Stiffness of knee joint, left  Abnormal gait  Weakness of left lower extremity  Decreased strength, endurance, and mobility      Subjective Assessment - 11/05/15 1151    Subjective Patient reports that he had significant increase in aching and pain yesterday and is not sure why - took epsiom salt bath last night  and feels great this am. Still has those "zingers" Significant improvement with treatment today.    Currently in Pain? No/denies                         [facility] Adult PT Treatment/Exercise - 11/05/15 0001    Ambulation/Gait   Gait Comments working on gait with improved pattern and decreased limp Lt LE 200 ft x 5-7 reps with and without mirror for part of ambulation    Posture/Postural Control   Posture Comments working on posture and alignment in standing encouraging full knee extension Lt - worked on wt shifts    Therapeutic Activites    Therapeutic Activities --  myofacial reelase work with the stick and ball    Knee/Hip Exercises: Community education officer 3 reps;30 seconds;60 seconds  2 sets    Passive Hamstring Stretch Limitations added HS stretch with slight abduction and slight adduction with pt working to fully extend the knee before stretch    Knee/Hip Exercises: Aerobic   Elliptical L1 x 5 min smooth revolutions -  1-2 min with "zinger" before starting time    Knee/Hip Exercises: Standing   Other Standing Knee Exercises terminal knee extensioin in standing; tandum stance with wt shift to facilitate quad/hamstring co contraction x 5    Knee/Hip Exercises: Supine   Quad Sets Left;5 reps  10 sec hold heel on half roll    Electrical Stimulation   Electrical Stimulation Location Lt knee    Electrical Stimulation Action IFC   Electrical Stimulation Parameters to tolerance   Electrical Stimulation Goals Edema;Pain   Ultrasound   Ultrasound Location distal hamstrings   Ultrasound Parameters ; 1.5 w/cm2; 100%    Ultrasound Goals Other (Comment);Pain  muscular tightness    Vasopneumatic   Number Minutes Vasopneumatic  15 minutes   Vasopnuematic Location  Knee   Vasopneumatic Pressure Medium   Vasopneumatic Temperature  3*   Manual Therapy   Soft tissue mobilization hamstrings/posterior knee proximal lposterior calf  - trigger point deep tissue  work    Myofascial Release hamstrings/calf/posterior knee                 PT Education - 11/05/15 1243    Education provided Yes   Education Details HEP working on full knee extension with HS stretch - added slight ab/add for HS stretch and increased stretch time; to add soleus stretch to calf stretch    Person(s) Educated Patient   Methods Explanation;Demonstration;Tactile cues;Verbal cues   Comprehension Verbalized understanding;Verbal cues required;Returned demonstration;Tactile cues required          PT Short Term Goals - 11/01/15 1107    PT SHORT TERM GOAL #1   Title Instruct pt  in appropriate gait pattern with appropriate assistive device 11/29/15   Time 6   Period Weeks   Status Achieved   PT SHORT TERM GOAL #2   Title Increase Lt knee ext to -8 to -6 deg 11/29/15   Time 6   Period Weeks   Status Achieved   PT SHORT TERM GOAL #3   Title Increase Lt knee flexion to 110-115 deg 11/29/15   Time 6   Period Weeks   Status Achieved   PT SHORT TERM GOAL #4   Title Increase strength Lt LE to 4+/5 throughout 11/29/15   Time 6   Period Weeks   Status Achieved           PT Long Term Goals - 10/29/15 1009    PT LONG TERM GOAL #1   Title Improve gait pattern with patient to demonstrate normal gait with minimal limp with appropriate assistive device 01/10/16   Time 12   Period Weeks   Status On-going   PT LONG TERM GOAL #2   Title Increase knee ROM to -5 to -7 degrees extension and 120-125 deg flexion 01/10/16   Time 12   Period Weeks   Status On-going   PT LONG TERM GOAL #3   Title Patient reports return to normal functional activities including sitting; standing; walking for 30-60 min without difficulty 01/10/16   Time 12   Period Weeks   Status On-going   PT LONG TERM GOAL #4   Title I in HEP 01/10/16   Time 12   Period Weeks   Status On-going   PT LONG TERM GOAL #5   Title Improve FOTO to </= 49% limitation 01/10/16   Time 12   Period Weeks   Status  On-going               Plan - 11/05/15 1246    Clinical Impression Statement Alinda Money responded well to focus on improving extensibility of tissue through the hamstrings on Lt reporting significant improvement in the 'zinger" he has been experiencing with movement and ambulation. Will add stretching and myofacial work for HEP    Pt will benefit from skilled therapeutic intervention in order to improve on the following deficits Postural dysfunction;Improper body mechanics;Abnormal gait;Increased fascial restricitons;Decreased range of motion;Decreased mobility;Decreased strength;Decreased endurance;Decreased activity tolerance;Pain   Rehab Potential Good   PT Frequency 3x / week   PT Duration 12 weeks   PT Treatment/Interventions Patient/family education;ADLs/Self Care Home Management;Therapeutic exercise;Therapeutic activities;Manual techniques;Neuromuscular re-education;Dry needling;Cryotherapy;Electrical Stimulation;Moist Heat;Ultrasound;Gait training;Functional mobility training;Balance training   PT Next Visit Plan Cotninue progressive strengthening/stretching to Lt knee - add work for hamstring - Soft tissue mobilization hamstrings and calf; modalities   PT Home Exercise Plan HEP myofacial work    Financial planner with Plan of Care Patient        Problem List Patient Active Problem List   Diagnosis Date Noted  . 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 Status post revision of total replacement of left knee 09/20/2015  . 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 Effusion of left knee joint; questionable prosthetic joint infection 05/07/2015  . Knee effusion 05/07/2015  . Status post total left knee replacement 01/08/2015  . Osteoarthritis of left knee 12/25/2014  . Lumbar spondylosis L2-5 09/14/2012    Veronika Heard Rober Minion PT, MPH  11/05/2015, 1:06 PM  [facility] [address]  [address] [place], [place], [address] Phone: [phone]   Fax:  [phone]  Name: Denson Niccoli MRN: [id number] Date of Birth: 1956/06/17

## 2015-11-08 ENCOUNTER — Encounter: Payer: Self-pay | Admitting: Rehabilitative and Restorative Service Providers"

## 2015-11-08 ENCOUNTER — Ambulatory Visit (INDEPENDENT_AMBULATORY_CARE_PROVIDER_SITE_OTHER): Payer: Medicare HMO | Admitting: Rehabilitative and Restorative Service Providers"

## 2015-11-08 DIAGNOSIS — R531 Weakness: Secondary | ICD-10-CM

## 2015-11-08 DIAGNOSIS — M25662 Stiffness of left knee, not elsewhere classified: Secondary | ICD-10-CM

## 2015-11-08 DIAGNOSIS — Z7409 Other reduced mobility: Secondary | ICD-10-CM

## 2015-11-08 DIAGNOSIS — R269 Unspecified abnormalities of gait and mobility: Secondary | ICD-10-CM

## 2015-11-08 DIAGNOSIS — R29898 Other symptoms and signs involving the musculoskeletal system: Secondary | ICD-10-CM

## 2015-11-08 DIAGNOSIS — M25562 Pain in left knee: Secondary | ICD-10-CM | POA: Diagnosis not present

## 2015-11-08 NOTE — Therapy (Signed)
[facility] [place], [place], [address] Phone: [phone]   Fax:  [phone]  Physical Therapy Treatment  Patient Details  Name: [name] MRN: [id number] Date of Birth: 09/26/1955 Referring Provider: dr. Zollie Beckers   Encounter Date: 11/08/2015      PT End of Session - 11/08/15 0947    Visit Number 9   Number of Visits 36   Date for PT Re-Evaluation 01/10/16   PT Start Time 0933   PT Stop Time 1026   PT Time Calculation (min) 53 min   Activity Tolerance Patient tolerated treatment well      Past Medical History  Diagnosis Date  . OCD (obsessive compulsive disorder)   . Anxiety   . History of kidney stones     x1 15 years ago  . Restless leg syndrome     takes requip daily  . Cataract     left eye  . Headache     hx of migraines-none recent  . Rheumatoid arthritis(714.0)     sees Dr Gerilyn Nestle @ [facility] in [place]  . Depression     takes Cymbalta daily  . ADD (attention deficit disorder)     takes Ritalin daily  . GERD (gastroesophageal reflux disease)     takes Omeprazole daily  . Enlarged prostate     takes Flomax daily   . Numbness and tingling     fingers bilat comes and goes   . Wears glasses   . History of blood transfusion   . Kidney stones   . Anemia     Past Surgical History  Procedure Laterality Date  . Total hip arthroplasty Right 01/2012    done in [facility]  . Hand tendon surgery Right 06/2012    done @ [facility] in [place]  . Rotator cuff repair Right 2008     done in [facility]  . Anterior lumbar fusion  09/13/2012    Procedure: ANTERIOR LUMBAR FUSION 1 LEVEL;  Surgeon: Faythe Ghee, MD;  Location: [facility];  Service: Neurosurgery;  Laterality: N/A;  lumbar four-five  . Anterior lat lumbar fusion  09/13/2012    Procedure: ANTERIOR LATERAL LUMBAR FUSION 2 LEVELS;  Surgeon: Faythe Ghee, MD;  Location: [facility];  Service: Neurosurgery;  Laterality:  Right;  lumbar two three,three-four  . Lumbar percutaneous pedicle screw 2 level  09/13/2012    Procedure: LUMBAR PERCUTANEOUS PEDICLE SCREW 2 LEVEL;  Surgeon: Faythe Ghee, MD;  [facility]: [facility];  Service: Neurosurgery;  Laterality: Left;  left pedicle screws two -three,three-four  . Abdominal exposure  09/13/2012    Procedure: ABDOMINAL EXPOSURE;  Surgeon: Rosetta Posner, MD;  Location: [facility];  Service: Vascular;  Laterality: N/A;  . Total knee arthroplasty Left 01/08/2015    Procedure: LEFT TOTAL KNEE ARTHROPLASTY;  Surgeon: Mcarthur Rossetti, MD;  Location: [facility];  Service: Orthopedics;  Laterality: Left;  . I&d knee with poly exchange Left 05/07/2015    Procedure: IRRIGATION AND DEBRIDEMENT LEFT KNEE WITH POLY EXCHANGE;  Surgeon: Mcarthur Rossetti, MD;  [facility]: [facility];  Service: Orthopedics;  Laterality: Left;  . Knee arthroscopy Left 06/22/2015    Procedure: LEFT KNEE ARTHROSCOPY WITH DEBRIDEMENT, SYNOVECTOMY;  Surgeon: Mcarthur Rossetti, MD;  Location: [facility];  Service: Orthopedics;  Laterality: Left;  . Excisional total knee arthroplasty with antibiotic spacers Left 08/06/2015    Procedure: EXCISION OF LEFT TOTAL KNEE ARTHROPLASTY WITH PLACEMENT OF  ANTIBIOTIC SPACERS;  Surgeon: Mcarthur Rossetti, MD;  [facility];  Service: Orthopedics;  Laterality: Left;  . Total knee revision Left 09/20/2015    Procedure: RE-IMPLANT/REVISION LEFT TOTAL KNEE ARTHROPLASTY;  Surgeon: Mcarthur Rossetti, MD;  Location: [place];  Service: Orthopedics;  Laterality: Left;    There were no vitals filed for this visit.  Visit Diagnosis:  Knee pain, left  Stiffness of knee joint, left  Abnormal gait  Weakness of left lower extremity  Decreased strength, endurance, and mobility      Subjective Assessment - 11/08/15 1011    Subjective Patient reports good day following therapy Friday but had pain again Saturday morning. Walked around outside on unlevel surfaces for 2  hours Saturday and had had incresaed 'zngers" Saturday and Sunday. The stretches help but he is still having pain. Sleeps on stomach with Lt LE abd and rotated; knee flexed.    Pain Score 3    Pain Location Knee   Pain Orientation Left   Pain Descriptors / Indicators Aching;Dull   Pain Type Chronic pain   Pain Onset More than a month ago   Pain Frequency Intermittent            [facility] PT Assessment - 11/08/15 0001    Assessment   Medical Diagnosis Lt TKA   Referring Provider dr. Zollie Beckers    Onset Date/Surgical Date 09/20/15  first TKA 01/08/15   Hand Dominance Right   Next MD Visit 4/17   AROM   Left Knee Extension 0  with quad set                      [facility] Adult PT Treatment/Exercise - 11/08/15 0001    Self-Care   Self-Care --  worked on sleeping positions    Therapeutic Activites    Therapeutic Activities --  myofacial reelase work with the stick and ball    Knee/Hip Exercises: Hydrologist 3 reps;30 seconds;60 seconds  2 sets - stretching before and after [place] & manual work to [employer] Limitations added HS stretch with slight abduction and slight adduction with pt working to fully extend the knee before stretch    Gastroc Stretch Left;2 reps;30 seconds   Soleus Stretch Left;2 reps;30 seconds   Knee/Hip Exercises: Aerobic   Elliptical L1 x 6 min   fewer zingers compared to last visit    Knee/Hip Exercises: Standing   Other Standing Knee Exercises terminal knee extensioin in standing; tandem stance with wt shift to facilitate quad/hamstring co contraction x 5    Knee/Hip Exercises: Supine   Quad Sets Left;5 reps  10 sec hold heel on half roll    Electrical Stimulation   Electrical Stimulation Location Lt knee    Electrical Stimulation Action IFC   Electrical Stimulation Parameters to tolerance   Electrical Stimulation Goals Edema;Pain   Ultrasound   Ultrasound Location distal hamstring Lt - medially  and laterally    Ultrasound Parameters 1 mHz; 1.5 w/cm2; 100%   Ultrasound Goals Other (Comment);Pain  muscular tightness    Vasopneumatic   Number Minutes Vasopneumatic  15 minutes   Vasopnuematic Location  Knee   Vasopneumatic Pressure Medium   Vasopneumatic Temperature  3*   Manual Therapy   Soft tissue mobilization hamstrings/posterior knee proximal lposterior calf  - trigger point deep tissue work    Myofascial Release hamstrings/calf/posterior knee  PT Education - 11/08/15 1305    Education provided Yes   Education Details HEP    Person(s) Educated Patient   Methods Explanation   Comprehension Verbalized understanding          PT Short Term Goals - 11/01/15 1107    PT SHORT TERM GOAL #1   Title Instruct pt in appropriate gait pattern with appropriate assistive device 11/29/15   Time 6   Period Weeks   Status Achieved   PT SHORT TERM GOAL #2   Title Increase Lt knee ext to -8 to -6 deg 11/29/15   Time 6   Period Weeks   Status Achieved   PT SHORT TERM GOAL #3   Title Increase Lt knee flexion to 110-115 deg 11/29/15   Time 6   Period Weeks   Status Achieved   PT SHORT TERM GOAL #4   Title Increase strength Lt LE to 4+/5 throughout 11/29/15   Time 6   Period Weeks   Status Achieved           PT Long Term Goals - 11/08/15 1312    PT LONG TERM GOAL #1   Title Improve gait pattern with patient to demonstrate normal gait with minimal limp with appropriate assistive device 01/10/16   Time 12   Period Weeks   Status On-going   PT LONG TERM GOAL #2   Title Increase knee ROM to -5 to -7 degrees extension and 120-125 deg flexion 01/10/16   Time 12   Period Weeks   Status Partially Met  0 deg ext with quad set    PT LONG TERM GOAL #3   Title Patient reports return to normal functional activities including sitting; standing; walking for 30-60 min without difficulty 01/10/16   Time 12   Period Weeks   Status On-going   PT LONG TERM GOAL #4    Title I in HEP 01/10/16   Time 12   Period Weeks   Status On-going   PT LONG TERM GOAL #5   Title Improve FOTO to </= 49% limitation 01/10/16   Time 12   Period Weeks   Status On-going               Plan - 11/08/15 1306    Clinical Impression Statement Initially excellent repsponse to focus on hamstring flexibility/tissue extensibility. He has increased pain in the am and difficulty sleeping which is likely related to the positions he sleeps in. Instructed in improved sleep positions. Good response to intervention. Achieved 0 deg knee extension. Will decresae frequency to 2x/wk next week. Gradual progress toward stated goals of therapy.    Pt will benefit from skilled therapeutic intervention in order to improve on the following deficits Postural dysfunction;Improper body mechanics;Abnormal gait;Increased fascial restricitons;Decreased range of motion;Decreased mobility;Decreased strength;Decreased endurance;Decreased activity tolerance;Pain   Rehab Potential Good   PT Frequency 3x / week   PT Duration 12 weeks   PT Treatment/Interventions Patient/family education;ADLs/Self Care Home Management;Therapeutic exercise;Therapeutic activities;Manual techniques;Neuromuscular re-education;Dry needling;Cryotherapy;Electrical Stimulation;Moist Heat;Ultrasound;Gait training;Functional mobility training;Balance training   PT Next Visit Plan Cotninue progressive strengthening/stretching to Lt knee - add work for hamstring - Soft tissue mobilization hamstrings and calf; modalities   PT Home Exercise Plan HEP myofacial work    Oncologist with Plan of Care Patient        Problem List Patient Active Problem List   Diagnosis Date Noted  . 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 Status post revision of total replacement of left knee 09/20/2015  . Infection of total left knee  replacement ([place]) 08/06/2015  . Effusion of left knee joint; questionable prosthetic joint infection 05/07/2015  . Knee effusion 05/07/2015  . Status post total left knee replacement 01/08/2015  . Osteoarthritis of left knee 12/25/2014  . Lumbar spondylosis L2-5 09/14/2012    [name] Nilda Simmer PT, MPH  11/08/2015, 1:15 PM  [facility] [place], [place], [address] Phone: [phone]   Fax:  [phone]  Name: [name] MRN: [id number] Date of Birth: 01/16/1956

## 2015-11-10 ENCOUNTER — Ambulatory Visit (INDEPENDENT_AMBULATORY_CARE_PROVIDER_SITE_OTHER): Payer: Medicare HMO | Admitting: Physical Therapy

## 2015-11-10 DIAGNOSIS — R269 Unspecified abnormalities of gait and mobility: Secondary | ICD-10-CM

## 2015-11-10 DIAGNOSIS — Z7409 Other reduced mobility: Secondary | ICD-10-CM

## 2015-11-10 DIAGNOSIS — M25562 Pain in left knee: Secondary | ICD-10-CM | POA: Diagnosis not present

## 2015-11-10 DIAGNOSIS — M25662 Stiffness of left knee, not elsewhere classified: Secondary | ICD-10-CM

## 2015-11-10 DIAGNOSIS — R531 Weakness: Secondary | ICD-10-CM

## 2015-11-10 DIAGNOSIS — R29898 Other symptoms and signs involving the musculoskeletal system: Secondary | ICD-10-CM | POA: Diagnosis not present

## 2015-11-10 NOTE — Therapy (Signed)
[place], [place], [address] Phone: [phone]   Fax:  [phone]  Physical Therapy Treatment  Patient Details  Name: [name] MRN: [id number] Date of Birth: [birth year] Referring Provider: Dr. Jean Rosenthal  Encounter Date: 11/10/2015      PT End of Session - 11/10/15 0937    Visit Number 10   Number of Visits 36   Date for PT Re-Evaluation 01/10/16   PT Start Time 0934   PT Stop Time 1027   PT Time Calculation (min) 53 min      Past Medical History  Diagnosis Date  . OCD (obsessive compulsive disorder)   . Anxiety   . History of kidney stones     x1 15 years ago  . Restless leg syndrome     takes requip daily  . Cataract     left eye  . Headache     hx of migraines-none recent  . Rheumatoid arthritis(714.0)     sees Dr Gerilyn Nestle @ [facility] in [place]  . Depression     takes Cymbalta daily  . ADD (attention deficit disorder)     takes Ritalin daily  . GERD (gastroesophageal reflux disease)     takes Omeprazole daily  . Enlarged prostate     takes Flomax daily   . Numbness and tingling     fingers bilat comes and goes   . Wears glasses   . History of blood transfusion   . Kidney stones   . Anemia     Past Surgical History  Procedure Laterality Date  . Total hip arthroplasty Right 01/2012    done in [facility]  . Hand tendon surgery Right 06/2012    done @ [facility] in [place]  . Rotator cuff repair Right 2008     done in [facility]  . Anterior lumbar fusion  09/13/2012    Procedure: ANTERIOR LUMBAR FUSION 1 LEVEL;  Surgeon: Faythe Ghee, MD;  Location: [facility];  Service: Neurosurgery;  Laterality: N/A;  lumbar four-five  . Anterior lat lumbar fusion  09/13/2012    Procedure: ANTERIOR LATERAL LUMBAR FUSION 2 LEVELS;  Surgeon: Faythe Ghee, MD;  Location: [facility];  Service: Neurosurgery;  Laterality: Right;  lumbar two three,three-four  . Lumbar  percutaneous pedicle screw 2 level  09/13/2012    Procedure: LUMBAR PERCUTANEOUS PEDICLE SCREW 2 LEVEL;  Surgeon: Faythe Ghee, MD;  [facility]: [facility];  Service: Neurosurgery;  Laterality: Left;  left pedicle screws two -three,three-four  . Abdominal exposure  09/13/2012    Procedure: ABDOMINAL EXPOSURE;  Surgeon: Rosetta Posner, MD;  [facility]: [facility];  Service: Vascular;  Laterality: N/A;  . Total knee arthroplasty Left 01/08/2015    Procedure: LEFT TOTAL KNEE ARTHROPLASTY;  Surgeon: Mcarthur Rossetti, MD;  [facility];  Service: Orthopedics;  Laterality: Left;  . I&d knee with poly exchange Left 05/07/2015    Procedure: IRRIGATION AND DEBRIDEMENT LEFT KNEE WITH POLY EXCHANGE;  Surgeon: Mcarthur Rossetti, MD;  [facility]: [facility];  Service: Orthopedics;  Laterality: Left;  . Knee arthroscopy Left 06/22/2015    Procedure: LEFT KNEE ARTHROSCOPY WITH DEBRIDEMENT, SYNOVECTOMY;  Surgeon: Mcarthur Rossetti, MD;  Location: [facility];  Service: Orthopedics;  Laterality: Left;  . Excisional total knee arthroplasty with antibiotic spacers Left 08/06/2015    Procedure: EXCISION OF LEFT TOTAL KNEE ARTHROPLASTY WITH PLACEMENT OF ANTIBIOTIC SPACERS;  Surgeon: Mcarthur Rossetti, MD;  [facility];  Service: Orthopedics;  Laterality: Left;  . Total knee revision Left 09/20/2015    Procedure: RE-IMPLANT/REVISION LEFT TOTAL KNEE ARTHROPLASTY;  Surgeon: Mcarthur Rossetti, MD;  Location: [facility];  Service: Orthopedics;  Laterality: Left;    There were no vitals filed for this visit.  Visit Diagnosis:  Knee pain, left  Abnormal gait  Stiffness of knee joint, left  Weakness of left lower extremity  Decreased strength, endurance, and mobility      Subjective Assessment - 11/10/15 0937    Subjective Pt reports he has changed sleeping position to side sleeping with pillow.  Still having zingers.    Patient Stated Goals regain use of Lt leg; 110 degrees of bend and full  extension; strengthen legs so he can walk without assistive device   Currently in Pain? Yes   Pain Score 3    Pain Location Knee   Pain Orientation Left   Pain Descriptors / Indicators Dull;Aching   Aggravating Factors  walking, standing    Pain Relieving Factors changing positions, ice.             [facility] PT Assessment - 11/10/15 0001    Assessment   Medical Diagnosis Lt TKA   Referring Provider Dr. Jean Rosenthal   Onset Date/Surgical Date 09/20/15  first TKA 01/08/15   Hand Dominance Right   Next MD Visit 4/17   AROM   Left Knee Extension 0  with quad set    Left Knee Flexion 110  with supine AAROM heel slide   Flexibility   Quadriceps Lt knee at 108 deg of flexion with strap.           [facility] Adult PT Treatment/Exercise - 11/10/15 0001    Knee/Hip Exercises: Stretches   Sports administrator Left;3 reps;60 seconds  prone with strap   Gastroc Stretch Left;2 reps;60 seconds   Soleus Stretch Left;2 reps;60 seconds   Knee/Hip Exercises: Aerobic   Elliptical L2: 5 min    Knee/Hip Exercises: Standing   Lateral Step Up Left;2 sets;10 reps;Hand Hold: 1;Step Height: 8"   Forward Step Up 2 sets;Hand Hold: 1;Step Height: 8";Left   Forward Step Up Limitations 8" too difficult - heavy UE assist.    Knee/Hip Exercises: Supine   Heel Slides AAROM  3 reps for measurement   [place] Limitations BLE on green ball x 15 reps (arms across chest); eccentric hamstring curl with BLE on green therapy ball (with bridge) x 5 reps    Knee/Hip Exercises: Prone   Hamstring Curl 5 reps   Prone Knee Hang 2 minutes   Electrical Stimulation   Electrical Stimulation Location Lt knee    Electrical Stimulation Action ion repelling   Electrical Stimulation Parameters to tolerance    Electrical Stimulation Goals Edema;Pain   Vasopneumatic   Number Minutes Vasopneumatic  15 minutes   Vasopnuematic Location  Knee   Vasopneumatic Pressure Medium   Vasopneumatic Temperature  3*                   PT Short Term Goals - 11/01/15 1107    PT SHORT TERM GOAL #1   Title Instruct pt in appropriate gait pattern with appropriate assistive device 11/29/15   Time 6   Period Weeks   Status Achieved   PT SHORT TERM GOAL #2   Title Increase Lt knee ext to -8 to -6 deg 11/29/15   Time 6   Period Weeks   Status Achieved   PT SHORT  TERM GOAL #3   Title Increase Lt knee flexion to 110-115 deg 11/29/15   Time 6   Period Weeks   Status Achieved   PT SHORT TERM GOAL #4   Title Increase strength Lt LE to 4+/5 throughout 11/29/15   Time 6   Period Weeks   Status Achieved           PT Long Term Goals - 11/08/15 1312    PT LONG TERM GOAL #1   Title Improve gait pattern with patient to demonstrate normal gait with minimal limp with appropriate assistive device 01/10/16   Time 12   Period Weeks   Status On-going   PT LONG TERM GOAL #2   Title Increase knee ROM to -5 to -7 degrees extension and 120-125 deg flexion 01/10/16   Time 12   Period Weeks   Status Partially Met  0 deg ext with quad set    PT LONG TERM GOAL #3   Title Patient reports return to normal functional activities including sitting; standing; walking for 30-60 min without difficulty 01/10/16   Time 12   Period Weeks   Status On-going   PT LONG TERM GOAL #4   Title I in HEP 01/10/16   Time 12   Period Weeks   Status On-going   PT LONG TERM GOAL #5   Title Improve FOTO to </= 49% limitation 01/10/16   Time 12   Period Weeks   Status On-going               Plan - 11/10/15 7846    Clinical Impression Statement Pt able to tolerate 8" forward step, without any increase in pain. Pt unable to tolerate 8" lateral step up due to weakness and pain.  Pt quad flexibility improving (up to 108 degree knee flexion in prone).  Gradually progressing towards goals.    Pt will benefit from skilled therapeutic intervention in order to improve on the following deficits Postural dysfunction;Improper body  mechanics;Abnormal gait;Increased fascial restricitons;Decreased range of motion;Decreased mobility;Decreased strength;Decreased endurance;Decreased activity tolerance;Pain   Rehab Potential Good   PT Frequency 3x / week   PT Duration 12 weeks   PT Treatment/Interventions Patient/family education;ADLs/Self Care Home Management;Therapeutic exercise;Therapeutic activities;Manual techniques;Neuromuscular re-education;Dry needling;Cryotherapy;Electrical Stimulation;Moist Heat;Ultrasound;Gait training;Functional mobility training;Balance training   PT Next Visit Plan Continue progressive strengthening/stretching to Lt knee - add work for hamstring - Soft tissue mobilization hamstrings and calf; modalities        Problem List Patient Active Problem List   Diagnosis Date Noted  . 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 Status post revision of total replacement of left knee 09/20/2015  . Infection of total left knee replacement ([place]) 08/06/2015  . Effusion of left knee joint; questionable prosthetic joint infection 05/07/2015  . Knee effusion 05/07/2015  . Status post total left knee replacement 01/08/2015  . Osteoarthritis of left knee 12/25/2014  . Lumbar spondylosis L2-5 09/14/2012   Kerin Perna, PTA 11/10/2015 1:08 PM   Celyn P. Helene Kelp PT, MPH 11/10/2015 1:08 PM   [facility] [place], [place], [address] Phone: [phone]   Fax:  [phone]  Name: [name] MRN: [id number] Date of Birth: 01-Feb-1956

## 2015-11-12 ENCOUNTER — Ambulatory Visit (INDEPENDENT_AMBULATORY_CARE_PROVIDER_SITE_OTHER): Payer: Medicare HMO | Admitting: Physical Therapy

## 2015-11-12 DIAGNOSIS — M25562 Pain in left knee: Secondary | ICD-10-CM | POA: Diagnosis not present

## 2015-11-12 DIAGNOSIS — R6889 Other general symptoms and signs: Secondary | ICD-10-CM

## 2015-11-12 DIAGNOSIS — R29898 Other symptoms and signs involving the musculoskeletal system: Secondary | ICD-10-CM

## 2015-11-12 DIAGNOSIS — R531 Weakness: Secondary | ICD-10-CM

## 2015-11-12 DIAGNOSIS — Z7409 Other reduced mobility: Secondary | ICD-10-CM

## 2015-11-12 DIAGNOSIS — R269 Unspecified abnormalities of gait and mobility: Secondary | ICD-10-CM

## 2015-11-12 DIAGNOSIS — M25662 Stiffness of left knee, not elsewhere classified: Secondary | ICD-10-CM | POA: Diagnosis not present

## 2015-11-12 NOTE — Therapy (Signed)
[facility] [place], [place], [address] Phone: [phone]   Fax:  [phone]  Physical Therapy Treatment  Patient Details  Name: [name] MRN: [id number] Date of Birth: 03-03-1956 Referring Provider: Dr. Jean Rosenthal  Encounter Date: 11/12/2015      PT End of Session - 11/12/15 0942    Visit Number 11   Number of Visits 36   Date for PT Re-Evaluation 01/10/16   PT Start Time 0932   PT Stop Time 7408   PT Time Calculation (min) 63 min   Activity Tolerance Patient tolerated treatment well      Past Medical History  Diagnosis Date  . OCD (obsessive compulsive disorder)   . Anxiety   . History of kidney stones     x1 15 years ago  . Restless leg syndrome     takes requip daily  . Cataract     left eye  . Headache     hx of migraines-none recent  . Rheumatoid arthritis(714.0)     sees Dr Gerilyn Nestle @ [facility] in [place]  . Depression     takes Cymbalta daily  . ADD (attention deficit disorder)     takes Ritalin daily  . GERD (gastroesophageal reflux disease)     takes Omeprazole daily  . Enlarged prostate     takes Flomax daily   . Numbness and tingling     fingers bilat comes and goes   . Wears glasses   . History of blood transfusion   . Kidney stones   . Anemia     Past Surgical History  Procedure Laterality Date  . Total hip arthroplasty Right 01/2012    done in [facility]  . Hand tendon surgery Right 06/2012    done @ [facility] in [place]  . Rotator cuff repair Right 2008     done in [facility]  . Anterior lumbar fusion  09/13/2012    Procedure: ANTERIOR LUMBAR FUSION 1 LEVEL;  Surgeon: Faythe Ghee, MD;  Location: [facility];  Service: Neurosurgery;  Laterality: N/A;  lumbar four-five  . Anterior lat lumbar fusion  09/13/2012    Procedure: ANTERIOR LATERAL LUMBAR FUSION 2 LEVELS;  Surgeon: Faythe Ghee, MD;  Location: [facility] NEUR[facility] ORS;  Service: Neurosurgery;   Laterality: Right;  lumbar two three,three-four  . Lumbar percutaneous pedicle screw 2 level  09/13/2012    Procedure: LUMBAR PERCUTANEOUS PEDICLE SCREW 2 LEVEL;  Surgeon: Faythe Ghee, MD;  Location: [facility];  Service: Neurosurgery;  Laterality: Left;  left pedicle screws two -three,three-four  . Abdominal exposure  09/13/2012    Procedure: ABDOMINAL EXPOSURE;  Surgeon: Rosetta Posner, MD;  Location: [facility];  Service: Vascular;  Laterality: N/A;  . Total knee arthroplasty Left 01/08/2015    Procedure: LEFT TOTAL KNEE ARTHROPLASTY;  Surgeon: Mcarthur Rossetti, MD;  Location: [facility];  Service: Orthopedics;  Laterality: Left;  . I&d knee with poly exchange Left 05/07/2015    Procedure: IRRIGATION AND DEBRIDEMENT LEFT KNEE WITH POLY EXCHANGE;  Surgeon: Mcarthur Rossetti, MD;  Location: [facility];  Service: Orthopedics;  Laterality: Left;  . Knee arthroscopy Left 06/22/2015    Procedure: LEFT KNEE ARTHROSCOPY WITH DEBRIDEMENT, SYNOVECTOMY;  Surgeon: Mcarthur Rossetti, MD;  Location: [facility];  Service: Orthopedics;  Laterality: Left;  . Excisional total knee arthroplasty with antibiotic spacers Left 08/06/2015    Procedure: EXCISION OF LEFT TOTAL KNEE ARTHROPLASTY WITH PLACEMENT OF ANTIBIOTIC  SPACERS;  Surgeon: Mcarthur Rossetti, MD;  Location: [facility];  Service: Orthopedics;  Laterality: Left;  . Total knee revision Left 09/20/2015    Procedure: RE-IMPLANT/REVISION LEFT TOTAL KNEE ARTHROPLASTY;  Surgeon: Mcarthur Rossetti, MD;  Location: [place] [facility];  Service: Orthopedics;  Laterality: Left;    There were no vitals filed for this visit.  Visit Diagnosis:  Knee pain, left  Abnormal gait  Stiffness of knee joint, left  Weakness of left lower extremity  Decreased strength, endurance, and mobility      Subjective Assessment - 11/12/15 0943    Subjective Pt reports he had one of the best days yesterday with minimal pain and zingers.  Today very stiff, with weather changes.     Currently in Pain? Yes   Pain Score 2    Pain Location Knee   Pain Orientation Left   Pain Descriptors / Indicators Simonne Martinet PT Assessment - 11/12/15 0001    Assessment   Medical Diagnosis Lt TKA   Referring Provider Dr. Jean Rosenthal   Onset Date/Surgical Date 09/20/15  first TKA 01/08/15   Hand Dominance Right   Next MD Visit 4/17   Observation/Other Assessments   Focus on Therapeutic Outcomes (FOTO)  55% limited.           [facility] Adult PT Treatment/Exercise - 11/12/15 0001    Ambulation/Gait   Ambulation/Gait Assistance 7: Independent   Ambulation Distance (Feet) --  ~400 ft   Assistive device None   Gait Pattern Decreased arm swing - right;Decreased step length - right;Decreased stance time - left;Decreased hip/knee flexion - left;Decreased dorsiflexion - left   Gait Comments mirror for visual feedback, metronome for improved stance time and step length;  Tactile cues to increase Rt arm swing (minimal carry over)    Knee/Hip Exercises: Stretches   Passive Hamstring Stretch Left;2 reps;20 seconds   Quad Stretch Left;3 reps;60 seconds  prone with strap   Gastroc Stretch Left;2 reps;60 seconds;Right   Soleus Stretch Left;2 reps;60 seconds;Right   Knee/Hip Exercises: Aerobic   Elliptical L2: 5 min    Knee/Hip Exercises: Standing   Heel Raises Both;2 sets;10 reps  feet on step   Lateral Step Up Left;2 sets;10 reps;Hand Hold: 1;Step Height: 6"   Lateral Step Up Limitations (8" step too difficult)   Forward Step Up Left;1 set;10 reps;Hand Hold: 1;Step Height: 8"   Forward Step Up Limitations 2nd set on Bosu    Other Standing Knee Exercises marching through agility ladder with VC for even stance time, mutiple reps.  High knee marching in place with same verbal cues.   Forward step over 1/2 foam roller with VC for heel strike and toe off - 10 reps each side.    Research scientist (medical) Parameters to tolerance    Electrical Stimulation Goals Edema;Pain   Vasopneumatic   Number Minutes Vasopneumatic  15 minutes   Vasopnuematic Location  Knee   Vasopneumatic Pressure Medium   Vasopneumatic Temperature  3*                  PT Short Term Goals - 11/01/15 1107    PT SHORT TERM GOAL #1   Title Instruct pt in appropriate gait pattern with appropriate assistive device 11/29/15   Time 6   Period Weeks   Status Achieved  PT SHORT TERM GOAL #2   Title Increase Lt knee ext to -8 to -6 deg 11/29/15   Time 6   Period Weeks   Status Achieved   PT SHORT TERM GOAL #3   Title Increase Lt knee flexion to 110-115 deg 11/29/15   Time 6   Period Weeks   Status Achieved   PT SHORT TERM GOAL #4   Title Increase strength Lt LE to 4+/5 throughout 11/29/15   Time 6   Period Weeks   Status Achieved           PT Long Term Goals - 11/08/15 1312    PT LONG TERM GOAL #1   Title Improve gait pattern with patient to demonstrate normal gait with minimal limp with appropriate assistive device 01/10/16   Time 12   Period Weeks   Status On-going   PT LONG TERM GOAL #2   Title Increase knee ROM to -5 to -7 degrees extension and 120-125 deg flexion 01/10/16   Time 12   Period Weeks   Status Partially Met  0 deg ext with quad set    PT LONG TERM GOAL #3   Title Patient reports return to normal functional activities including sitting; standing; walking for 30-60 min without difficulty 01/10/16   Time 12   Period Weeks   Status On-going   PT LONG TERM GOAL #4   Title I in HEP 01/10/16   Time 12   Period Weeks   Status On-going   PT LONG TERM GOAL #5   Title Improve FOTO to </= 49% limitation 01/10/16   Time 12   Period Weeks   Status On-going               Plan - 11/12/15 1042    Pt will benefit from skilled therapeutic intervention in order to improve on the following deficits Postural dysfunction;Improper body  mechanics;Abnormal gait;Increased fascial restricitons;Decreased range of motion;Decreased mobility;Decreased strength;Decreased endurance;Decreased activity tolerance;Pain   Rehab Potential Good   PT Frequency 3x / week   PT Duration 12 weeks   PT Treatment/Interventions Patient/family education;ADLs/Self Care Home Management;Therapeutic exercise;Therapeutic activities;Manual techniques;Neuromuscular re-education;Dry needling;Cryotherapy;Electrical Stimulation;Moist Heat;Ultrasound;Gait training;Functional mobility training;Balance training   PT Next Visit Plan Continue progressive strengthening/stretching to Lt knee - add work for hamstring - Soft tissue mobilization hamstrings and calf; modalities   Consulted and Agree with Plan of Care Patient        Problem List Patient Active Problem List   Diagnosis Date Noted  . 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 Status post revision of total replacement of left knee 09/20/2015  . Infection of total left knee replacement ([facility]) 08/06/2015  . Effusion of left knee joint; questionable prosthetic joint infection 05/07/2015  . Knee effusion 05/07/2015  . Status post total left knee replacement 01/08/2015  . Osteoarthritis of left knee 12/25/2014  . Lumbar spondylosis L2-5 09/14/2012   Kerin Perna, PTA 11/12/2015 10:42 AM  [place], [place], [address] Phone: [phone]   Fax:  [phone]  Name: [name] MRN: [id number] Date of Birth: 07-29-1956

## 2015-11-15 ENCOUNTER — Ambulatory Visit (INDEPENDENT_AMBULATORY_CARE_PROVIDER_SITE_OTHER): Payer: Medicare HMO | Admitting: Physical Therapy

## 2015-11-15 DIAGNOSIS — M25662 Stiffness of left knee, not elsewhere classified: Secondary | ICD-10-CM

## 2015-11-15 DIAGNOSIS — M25562 Pain in left knee: Secondary | ICD-10-CM | POA: Diagnosis not present

## 2015-11-15 DIAGNOSIS — R29898 Other symptoms and signs involving the musculoskeletal system: Secondary | ICD-10-CM

## 2015-11-15 DIAGNOSIS — Z7409 Other reduced mobility: Secondary | ICD-10-CM

## 2015-11-15 DIAGNOSIS — R531 Weakness: Secondary | ICD-10-CM

## 2015-11-15 DIAGNOSIS — R269 Unspecified abnormalities of gait and mobility: Secondary | ICD-10-CM

## 2015-11-15 NOTE — Therapy (Signed)
[place], [place], [address] Phone: [phone]   Fax:  [phone]  Physical Therapy Treatment  Patient Details  Name: [name] MRN: [id number] Date of Birth: April 28, 1956 Referring Provider: Dr. Jean Rosenthal  Encounter Date: 11/15/2015      PT End of Session - 11/15/15 1017    Visit Number 12   Number of Visits 36   Date for PT Re-Evaluation 01/10/16   PT Start Time 0934   PT Stop Time 1029   PT Time Calculation (min) 55 min   Activity Tolerance Patient tolerated treatment well      Past Medical History  Diagnosis Date  . OCD (obsessive compulsive disorder)   . Anxiety   . History of kidney stones     x1 15 years ago  . Restless leg syndrome     takes requip daily  . Cataract     left eye  . Headache     hx of migraines-none recent  . Rheumatoid arthritis(714.0)     sees Dr Gerilyn Nestle @ [facility] in [place]  . Depression     takes Cymbalta daily  . ADD (attention deficit disorder)     takes Ritalin daily  . GERD (gastroesophageal reflux disease)     takes Omeprazole daily  . Enlarged prostate     takes Flomax daily   . Numbness and tingling     fingers bilat comes and goes   . Wears glasses   . History of blood transfusion   . Kidney stones   . Anemia     Past Surgical History  Procedure Laterality Date  . Total hip arthroplasty Right 01/2012    done in [facility]  . Hand tendon surgery Right 06/2012    done @ [facility] in [place]  . Rotator cuff repair Right 2008     done in [facility]  . Anterior lumbar fusion  09/13/2012    Procedure: ANTERIOR LUMBAR FUSION 1 LEVEL;  Surgeon: Faythe Ghee, MD;  Location: [facility] NEURO ORS;  Service: Neurosurgery;  Laterality: N/A;  lumbar four-five  . Anterior lat lumbar fusion  09/13/2012    Procedure: ANTERIOR LATERAL LUMBAR FUSION 2 LEVELS;  Surgeon: Faythe Ghee, MD;  Location: [facility] NEURO ORS;  Service: Neurosurgery;   Laterality: Right;  lumbar two three,three-four  . Lumbar percutaneous pedicle screw 2 level  09/13/2012    Procedure: LUMBAR PERCUTANEOUS PEDICLE SCREW 2 LEVEL;  Surgeon: Faythe Ghee, MD;  Location: [facility];  Service: Neurosurgery;  Laterality: Left;  left pedicle screws two -three,three-four  . Abdominal exposure  09/13/2012    Procedure: ABDOMINAL EXPOSURE;  Surgeon: Rosetta Posner, MD;  Location: [facility];  Service: Vascular;  Laterality: N/A;  . Total knee arthroplasty Left 01/08/2015    Procedure: LEFT TOTAL KNEE ARTHROPLASTY;  Surgeon: Mcarthur Rossetti, MD;  [facility]: [facility];  Service: Orthopedics;  Laterality: Left;  . I&d knee with poly exchange Left 05/07/2015    Procedure: IRRIGATION AND DEBRIDEMENT LEFT KNEE WITH POLY EXCHANGE;  Surgeon: Mcarthur Rossetti, MD;  Location: [facility];  Service: Orthopedics;  Laterality: Left;  . Knee arthroscopy Left 06/22/2015    Procedure: LEFT KNEE ARTHROSCOPY WITH DEBRIDEMENT, SYNOVECTOMY;  Surgeon: Mcarthur Rossetti, MD;  Location: [facility];  Service: Orthopedics;  Laterality: Left;  . Excisional total knee arthroplasty with antibiotic spacers Left 08/06/2015    Procedure: EXCISION OF LEFT TOTAL KNEE ARTHROPLASTY WITH PLACEMENT OF ANTIBIOTIC  SPACERS;  Surgeon: Mcarthur Rossetti, MD;  [facility]: [facility];  Service: Orthopedics;  Laterality: Left;  . Total knee revision Left 09/20/2015    Procedure: RE-IMPLANT/REVISION LEFT TOTAL KNEE ARTHROPLASTY;  Surgeon: Mcarthur Rossetti, MD;  Location: [facility];  Service: Orthopedics;  Laterality: Left;    There were no vitals filed for this visit.  Visit Diagnosis:  Knee pain, left  Abnormal gait  Stiffness of knee joint, left  Weakness of left lower extremity  Decreased strength, endurance, and mobility      Subjective Assessment - 11/15/15 0959    Subjective Pt reports no new changes since last visit.    Currently in Pain? Yes   Pain Score 2    Pain Location Knee   Pain  Orientation Left   Pain Descriptors / Indicators Dull   Aggravating Factors  being still too long, up on feet too long   Pain Relieving Factors ice, rest             [facility] PT Assessment - 11/15/15 0001    Assessment   Medical Diagnosis Lt TKA   Onset Date/Surgical Date 09/20/15   Hand Dominance Right   Next MD Visit 4/17   AROM   Left Knee Flexion 117          [facility] Adult PT Treatment/Exercise - 11/15/15 0001    Knee/Hip Exercises: Stretches   Sports administrator Left;3 reps;60 seconds  prone with strap   Gastroc Stretch Left;2 reps;60 seconds;Right   Soleus Stretch Left;2 reps;60 seconds;Right   Other Knee/Hip Stretches Lunges on 13" step x 10 sec hold x 10 reps , followed by hamstring stretch x 10 sec x 10 reps    Knee/Hip Exercises: Aerobic   Stationary Bike L3: 5.5 min    Knee/Hip Exercises: Machines for Strengthening   Cybex Knee Extension LLE x 10 reps with 1 plate, repeated with 2 plates.    Total Gym Leg Press BLE x 10 with 7 plates; LLE only x 10 with 5 plates.    Knee/Hip Exercises: Standing   Heel Raises Both;2 sets;10 reps  feet on step   Lateral Step Up Left;1 set;10 reps;Hand Hold: 0;Step Height: 6"   Forward Step Up 1 set;10 reps;Left;Hand Hold: 1;Step Height: 8"   Step Down Right;1 set;10 reps;Hand Hold: 1;Step Height: 2"   Step Down Limitations very challenging   SLS SLS on grey pad x 30 sec x 3 trials.    Human resources officer Parameters to tolerance    Electrical Stimulation Goals Edema;Pain   Vasopneumatic   Number Minutes Vasopneumatic  15 minutes   Vasopnuematic Location  Knee   Vasopneumatic Pressure Medium   Vasopneumatic Temperature  3*           PT Short Term Goals - 11/01/15 1107    PT SHORT TERM GOAL #1   Title Instruct pt in appropriate gait pattern with appropriate assistive device 11/29/15   Time 6   Period Weeks    Status Achieved   PT SHORT TERM GOAL #2   Title Increase Lt knee ext to -8 to -6 deg 11/29/15   Time 6   Period Weeks   Status Achieved   PT SHORT TERM GOAL #3   Title Increase Lt knee flexion to 110-115 deg 11/29/15   Time 6   Period Weeks   Status Achieved   PT SHORT  TERM GOAL #4   Title Increase strength Lt LE to 4+/5 throughout 11/29/15   Time 6   Period Weeks   Status Achieved           PT Long Term Goals - 11/08/15 1312    PT LONG TERM GOAL #1   Title Improve gait pattern with patient to demonstrate normal gait with minimal limp with appropriate assistive device 01/10/16   Time 12   Period Weeks   Status On-going   PT LONG TERM GOAL #2   Title Increase knee ROM to -5 to -7 degrees extension and 120-125 deg flexion 01/10/16   Time 12   Period Weeks   Status Partially Met  0 deg ext with quad set    PT LONG TERM GOAL #3   Title Patient reports return to normal functional activities including sitting; standing; walking for 30-60 min without difficulty 01/10/16   Time 12   Period Weeks   Status On-going   PT LONG TERM GOAL #4   Title I in HEP 01/10/16   Time 12   Period Weeks   Status On-going   PT LONG TERM GOAL #5   Title Improve FOTO to </= 49% limitation 01/10/16   Time 12   Period Weeks   Status On-going               Plan - 11/15/15 1018    Clinical Impression Statement Pt demonstrated improved Lt knee flexion ROM.  Pt tolerated all new exercises with minimal increase in pain.  Progressing towards goals.    Pt will benefit from skilled therapeutic intervention in order to improve on the following deficits Postural dysfunction;Improper body mechanics;Abnormal gait;Increased fascial restricitons;Decreased range of motion;Decreased mobility;Decreased strength;Decreased endurance;Decreased activity tolerance;Pain   Rehab Potential Good   PT Frequency 3x / week   PT Duration 12 weeks   PT Treatment/Interventions Patient/family education;ADLs/Self Care Home  Management;Therapeutic exercise;Therapeutic activities;Manual techniques;Neuromuscular re-education;Dry needling;Cryotherapy;Electrical Stimulation;Moist Heat;Ultrasound;Gait training;Functional mobility training;Balance training   PT Next Visit Plan Continue progressive strengthening/stretching to Lt knee - add work for hamstring - Soft tissue mobilization hamstrings and calf; modalities   Consulted and Agree with Plan of Care Patient        Problem List Patient Active Problem List   Diagnosis Date Noted  . 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 Status post revision of total replacement of left knee 09/20/2015  . Infection of total left knee replacement ([facility]) 08/06/2015  . Effusion of left knee joint; questionable prosthetic joint infection 05/07/2015  . Knee effusion 05/07/2015  . Status post total left knee replacement 01/08/2015  . Osteoarthritis of left knee 12/25/2014  . Lumbar spondylosis L2-5 09/14/2012    Kerin Perna, PTA 11/15/2015 10:20 AM  [place], [place], [address] Phone: [phone]   Fax:  [phone]  Name: Dayne Chait MRN: [id number] Date of Birth: July 24, 1956

## 2015-11-18 ENCOUNTER — Ambulatory Visit (INDEPENDENT_AMBULATORY_CARE_PROVIDER_SITE_OTHER): Payer: Medicare HMO | Admitting: Physical Therapy

## 2015-11-18 DIAGNOSIS — M25662 Stiffness of left knee, not elsewhere classified: Secondary | ICD-10-CM | POA: Diagnosis not present

## 2015-11-18 DIAGNOSIS — M6281 Muscle weakness (generalized): Secondary | ICD-10-CM | POA: Diagnosis not present

## 2015-11-18 DIAGNOSIS — M25562 Pain in left knee: Secondary | ICD-10-CM | POA: Diagnosis not present

## 2015-11-18 NOTE — Therapy (Signed)
[place], [place], [address] Phone: [phone]   Fax:  [phone]  Physical Therapy Treatment  Patient Details  Name: [name] MRN: [id number] Date of Birth: [birth year] Referring Provider: Dr. Jean Rosenthal  Encounter Date: 11/18/2015      PT End of Session - 11/18/15 0944    Visit Number 13   Number of Visits 36   Date for PT Re-Evaluation 01/10/16   PT Start Time 0935   PT Stop Time 1034   PT Time Calculation (min) 59 min      Past Medical History  Diagnosis Date  . OCD (obsessive compulsive disorder)   . Anxiety   . History of kidney stones     x1 15 years ago  . Restless leg syndrome     takes requip daily  . Cataract     left eye  . Headache     hx of migraines-none recent  . Rheumatoid arthritis(714.0)     sees Dr Gerilyn Nestle @ [facility] in [place]  . Depression     takes Cymbalta daily  . ADD (attention deficit disorder)     takes Ritalin daily  . GERD (gastroesophageal reflux disease)     takes Omeprazole daily  . Enlarged prostate     takes Flomax daily   . Numbness and tingling     fingers bilat comes and goes   . Wears glasses   . History of blood transfusion   . Kidney stones   . Anemia     Past Surgical History  Procedure Laterality Date  . Total hip arthroplasty Right 01/2012    done in [facility]  . Hand tendon surgery Right 06/2012    done @ [facility] in [place]  . Rotator cuff repair Right 2008     done in [facility]  . Anterior lumbar fusion  09/13/2012    Procedure: ANTERIOR LUMBAR FUSION 1 LEVEL;  Surgeon: Faythe Ghee, MD;  Location: [facility];  Service: Neurosurgery;  Laterality: N/A;  lumbar four-five  . Anterior lat lumbar fusion  09/13/2012    Procedure: ANTERIOR LATERAL LUMBAR FUSION 2 LEVELS;  Surgeon: Faythe Ghee, MD;  Location: [facility];  Service: Neurosurgery;  Laterality: Right;  lumbar two three,three-four  . Lumbar  percutaneous pedicle screw 2 level  09/13/2012    Procedure: LUMBAR PERCUTANEOUS PEDICLE SCREW 2 LEVEL;  Surgeon: Faythe Ghee, MD;  Location: [facility];  Service: Neurosurgery;  Laterality: Left;  left pedicle screws two -three,three-four  . Abdominal exposure  09/13/2012    Procedure: ABDOMINAL EXPOSURE;  Surgeon: Rosetta Posner, MD;  Location: [facility];  Service: Vascular;  Laterality: N/A;  . Total knee arthroplasty Left 01/08/2015    Procedure: LEFT TOTAL KNEE ARTHROPLASTY;  Surgeon: Mcarthur Rossetti, MD;  Location: [facility];  Service: Orthopedics;  Laterality: Left;  . I&d knee with poly exchange Left 05/07/2015    Procedure: IRRIGATION AND DEBRIDEMENT LEFT KNEE WITH POLY EXCHANGE;  Surgeon: Mcarthur Rossetti, MD;  Location: [facility];  Service: Orthopedics;  Laterality: Left;  . Knee arthroscopy Left 06/22/2015    Procedure: LEFT KNEE ARTHROSCOPY WITH DEBRIDEMENT, SYNOVECTOMY;  Surgeon: Mcarthur Rossetti, MD;  Location: [facility];  Service: Orthopedics;  Laterality: Left;  . Excisional total knee arthroplasty with antibiotic spacers Left 08/06/2015    Procedure: EXCISION OF LEFT TOTAL KNEE ARTHROPLASTY WITH PLACEMENT OF ANTIBIOTIC SPACERS;  Surgeon: Mcarthur Rossetti, MD;  [facility]: [facility];  Service: Orthopedics;  Laterality: Left;  . Total knee revision Left 09/20/2015    Procedure: RE-IMPLANT/REVISION LEFT TOTAL KNEE ARTHROPLASTY;  Surgeon: Mcarthur Rossetti, MD;  Location: [facility];  Service: Orthopedics;  Laterality: Left;    There were no vitals filed for this visit.  Visit Diagnosis:  Knee pain, left  Stiffness of knee joint, left  Muscle weakness (generalized)      Subjective Assessment - 11/18/15 1817    Subjective Pt reports no new changes since last visit.    Currently in Pain? Yes   Pain Score 2    Pain Location Knee   Pain Orientation Left            [facility] PT Assessment - 11/18/15 0001    Assessment   Medical Diagnosis Lt TKA   Onset  Date/Surgical Date 09/20/15   Hand Dominance Right   Next MD Visit 11/29/15          [facility] Adult PT Treatment/Exercise - 11/18/15 0001    Knee/Hip Exercises: Stretches   Quad Stretch Left;1 rep;60 seconds   Gastroc Stretch Left;2 reps;60 seconds;Right   Soleus Stretch Left;2 reps;60 seconds;Right   Knee/Hip Exercises: Aerobic   Stationary Bike L1: 3 min    Elliptical L1: 3 min    Knee/Hip Exercises: Machines for Strengthening   Cybex Knee Extension LLE x 10 reps x 2 sets with 2 plates    Total Gym Leg Press LLE only x 10 reps x 2 sets with 5 plates.    Knee/Hip Exercises: Standing   Lateral Step Up Left;2 sets;10 reps;Hand Hold: 2;Step Height: 8"   Forward Step Up 10 reps;Left;Hand Hold: 1;Step Height: 8";2 sets   SLS SLS on Bosu x 30 sec x 2 trials with occasional UE support   Other Standing Knee Exercises Marching with high knees and relaxed ankles    Electrical Stimulation   Electrical Stimulation Location Lt knee    Electrical Stimulation Action ion repelling    Electrical Stimulation Parameters to tolerance    Electrical Stimulation Goals Edema;Pain   Vasopneumatic   Number Minutes Vasopneumatic  15 minutes   Vasopnuematic Location  Knee   Vasopneumatic Pressure Medium   Vasopneumatic Temperature  3*   Manual Therapy   Manual Therapy Soft tissue mobilization   Soft tissue mobilization to Lt hamstring; muscle stripping; pin and stretch to Lt hamstring with pt in prone.                    PT Short Term Goals - 11/01/15 1107    PT SHORT TERM GOAL #1   Title Instruct pt in appropriate gait pattern with appropriate assistive device 11/29/15   Time 6   Period Weeks   Status Achieved   PT SHORT TERM GOAL #2   Title Increase Lt knee ext to -8 to -6 deg 11/29/15   Time 6   Period Weeks   Status Achieved   PT SHORT TERM GOAL #3   Title Increase Lt knee flexion to 110-115 deg 11/29/15   Time 6   Period Weeks   Status Achieved   PT SHORT TERM GOAL #4   Title  Increase strength Lt LE to 4+/5 throughout 11/29/15   Time 6   Period Weeks   Status Achieved           PT Long Term Goals - 11/08/15 1312    PT LONG TERM GOAL #1   Title Improve gait pattern with  patient to demonstrate normal gait with minimal limp with appropriate assistive device 01/10/16   Time 12   Period Weeks   Status On-going   PT LONG TERM GOAL #2   Title Increase knee ROM to -5 to -7 degrees extension and 120-125 deg flexion 01/10/16   Time 12   Period Weeks   Status Partially Met  0 deg ext with quad set    PT LONG TERM GOAL #3   Title Patient reports return to normal functional activities including sitting; standing; walking for 30-60 min without difficulty 01/10/16   Time 12   Period Weeks   Status On-going   PT LONG TERM GOAL #4   Title I in HEP 01/10/16   Time 12   Period Weeks   Status On-going   PT LONG TERM GOAL #5   Title Improve FOTO to </= 49% limitation 01/10/16   Time 12   Period Weeks   Status On-going               Plan - 11/18/15 1153    Clinical Impression Statement Pt tolerated increased resistance and increased step height this date, without increase in pain. Hamstring tightness reduced with manual therapy.  Progressing well towards remaining goals.    Pt will benefit from skilled therapeutic intervention in order to improve on the following deficits Postural dysfunction;Improper body mechanics;Abnormal gait;Increased fascial restricitons;Decreased range of motion;Decreased mobility;Decreased strength;Decreased endurance;Decreased activity tolerance;Pain   Rehab Potential Good   PT Frequency 3x / week   PT Duration 12 weeks   PT Treatment/Interventions Patient/family education;ADLs/Self Care Home Management;Therapeutic exercise;Therapeutic activities;Manual techniques;Neuromuscular re-education;Dry needling;Cryotherapy;Electrical Stimulation;Moist Heat;Ultrasound;Gait training;Functional mobility training;Balance training   PT Next Visit  Plan Continue progressive strengthening/stretching to Lt knee - add work for hamstring - Soft tissue mobilization hamstrings and calf; modalities as indicated    Consulted and Agree with Plan of Care Patient        Problem List Patient Active Problem List   Diagnosis Date Noted  . 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 Status post revision of total replacement of left knee 09/20/2015  . Infection of total left knee replacement ([place]) 08/06/2015  . Effusion of left knee joint; questionable prosthetic joint infection 05/07/2015  . Knee effusion 05/07/2015  . Status post total left knee replacement 01/08/2015  . Osteoarthritis of left knee 12/25/2014  . Lumbar spondylosis L2-5 09/14/2012   Kerin Perna, PTA 11/18/2015 6:17 PM  [place], [place], [address] Phone: [phone]   Fax:  [phone]  Name: Archer Moist MRN: [id number] Date of Birth: [birth year]

## 2015-11-22 ENCOUNTER — Ambulatory Visit (INDEPENDENT_AMBULATORY_CARE_PROVIDER_SITE_OTHER): Payer: Medicare HMO | Admitting: Rehabilitation

## 2015-11-22 DIAGNOSIS — M6281 Muscle weakness (generalized): Secondary | ICD-10-CM

## 2015-11-22 DIAGNOSIS — M25562 Pain in left knee: Secondary | ICD-10-CM

## 2015-11-22 DIAGNOSIS — M25662 Stiffness of left knee, not elsewhere classified: Secondary | ICD-10-CM

## 2015-11-22 DIAGNOSIS — R269 Unspecified abnormalities of gait and mobility: Secondary | ICD-10-CM | POA: Diagnosis not present

## 2015-11-22 NOTE — Patient Instructions (Signed)
Adding prone extension hangs to HEP for hamstring work

## 2015-11-22 NOTE — Therapy (Signed)
[place], [place], [address] Phone: [phone]   Fax:  [phone]  Physical Therapy Treatment  Patient Details  Name: [name] MRN: [id number] Date of Birth: 1956-06-18 Referring Provider: Dr. Jean Rosenthal  Encounter Date: 11/22/2015      PT End of Session - 11/22/15 1151    Visit Number 14   Number of Visits 36   Date for PT Re-Evaluation 01/10/16   PT Start Time 0925   PT Stop Time 1015   PT Time Calculation (min) 50 min   Activity Tolerance Patient tolerated treatment well      Past Medical History  Diagnosis Date  . OCD (obsessive compulsive disorder)   . Anxiety   . History of kidney stones     x1 15 years ago  . Restless leg syndrome     takes requip daily  . Cataract     left eye  . Headache     hx of migraines-none recent  . Rheumatoid arthritis(714.0)     sees Dr Gerilyn Nestle @ [facility] in [place]  . Depression     takes Cymbalta daily  . ADD (attention deficit disorder)     takes Ritalin daily  . GERD (gastroesophageal reflux disease)     takes Omeprazole daily  . Enlarged prostate     takes Flomax daily   . Numbness and tingling     fingers bilat comes and goes   . Wears glasses   . History of blood transfusion   . Kidney stones   . Anemia     Past Surgical History  Procedure Laterality Date  . Total hip arthroplasty Right 01/2012    done in [facility]  . Hand tendon surgery Right 06/2012    done @ [facility] in [place]  . Rotator cuff repair Right 2008     done in [facility]  . Anterior lumbar fusion  09/13/2012    Procedure: ANTERIOR LUMBAR FUSION 1 LEVEL;  Surgeon: Faythe Ghee, MD;  Location: [facility];  Service: Neurosurgery;  Laterality: N/A;  lumbar four-five  . Anterior lat lumbar fusion  09/13/2012    Procedure: ANTERIOR LATERAL LUMBAR FUSION 2 LEVELS;  Surgeon: Faythe Ghee, MD;  Location: [facility];  Service: Neurosurgery;   Laterality: Right;  lumbar two three,three-four  . Lumbar percutaneous pedicle screw 2 level  09/13/2012    Procedure: LUMBAR PERCUTANEOUS PEDICLE SCREW 2 LEVEL;  Surgeon: Faythe Ghee, MD;  Location: [facility];  Service: Neurosurgery;  Laterality: Left;  left pedicle screws two -three,three-four  . Abdominal exposure  09/13/2012    Procedure: ABDOMINAL EXPOSURE;  Surgeon: Rosetta Posner, MD;  Location: [facility];  Service: Vascular;  Laterality: N/A;  . Total knee arthroplasty Left 01/08/2015    Procedure: LEFT TOTAL KNEE ARTHROPLASTY;  Surgeon: Mcarthur Rossetti, MD;  [facility];  Service: Orthopedics;  Laterality: Left;  . I&d knee with poly exchange Left 05/07/2015    Procedure: IRRIGATION AND DEBRIDEMENT LEFT KNEE WITH POLY EXCHANGE;  Surgeon: Mcarthur Rossetti, MD;  [facility]: [facility];  Service: Orthopedics;  Laterality: Left;  . Knee arthroscopy Left 06/22/2015    Procedure: LEFT KNEE ARTHROSCOPY WITH DEBRIDEMENT, SYNOVECTOMY;  Surgeon: Mcarthur Rossetti, MD;  Location: [facility];  Service: Orthopedics;  Laterality: Left;  . Excisional total knee arthroplasty with antibiotic spacers Left 08/06/2015    Procedure: EXCISION OF LEFT TOTAL KNEE ARTHROPLASTY WITH PLACEMENT OF ANTIBIOTIC  SPACERS;  Surgeon: Mcarthur Rossetti, MD;  [facility]: [facility];  Service: Orthopedics;  Laterality: Left;  . Total knee revision Left 09/20/2015    Procedure: RE-IMPLANT/REVISION LEFT TOTAL KNEE ARTHROPLASTY;  Surgeon: Mcarthur Rossetti, MD;  Location: [facility];  Service: Orthopedics;  Laterality: Left;    There were no vitals filed for this visit.      Subjective Assessment - 11/22/15 0927    Subjective all the same   Currently in Pain? Yes   Pain Score 2    Pain Location Knee   Pain Orientation Left   Pain Descriptors / Indicators Dull   Aggravating Factors  being still too long, up on feet too long   Pain Relieving Factors ice, rest                         [facility]  Adult PT Treatment/Exercise - 11/22/15 0001    Knee/Hip Exercises: Aerobic   Stationary Bike L1: 3 min    Elliptical L1: 3 min    Knee/Hip Exercises: Standing   Lateral Step Up Left;2 sets;10 reps;Hand Hold: 2;Step Height: 8"   Lateral Step Up Limitations decreased "zing" with manual tibial IR/fibular head PA glide held today   Forward Step Up 10 reps;Left;Hand Hold: 1;Step Height: 8";2 sets   SLS SLS on Bosu x 30 sec x 2 trials with occasional UE support   Knee/Hip Exercises: Prone   Prone Knee Hang 2 minutes   Manual Therapy   Manual Therapy Soft tissue mobilization;Taping   Manual therapy comments Mulligan taping using leukotape into tibial IR in standing   Soft tissue mobilization to L hamstring in prone hang position medial and lateral                  PT Short Term Goals - 11/01/15 1107    PT SHORT TERM GOAL #1   Title Instruct pt in appropriate gait pattern with appropriate assistive device 11/29/15   Time 6   Period Weeks   Status Achieved   PT SHORT TERM GOAL #2   Title Increase Lt knee ext to -8 to -6 deg 11/29/15   Time 6   Period Weeks   Status Achieved   PT SHORT TERM GOAL #3   Title Increase Lt knee flexion to 110-115 deg 11/29/15   Time 6   Period Weeks   Status Achieved   PT SHORT TERM GOAL #4   Title Increase strength Lt LE to 4+/5 throughout 11/29/15   Time 6   Period Weeks   Status Achieved           PT Long Term Goals - 11/08/15 1312    PT LONG TERM GOAL #1   Title Improve gait pattern with patient to demonstrate normal gait with minimal limp with appropriate assistive device 01/10/16   Time 12   Period Weeks   Status On-going   PT LONG TERM GOAL #2   Title Increase knee ROM to -5 to -7 degrees extension and 120-125 deg flexion 01/10/16   Time 12   Period Weeks   Status Partially Met  0 deg ext with quad set    PT LONG TERM GOAL #3   Title Patient reports return to normal functional activities including sitting; standing; walking for  30-60 min without difficulty 01/10/16   Time 12   Period Weeks   Status On-going   PT LONG TERM GOAL #4   Title I in HEP 01/10/16  Time 12   Period Weeks   Status On-going   PT LONG TERM GOAL #5   Title Improve FOTO to </= 49% limitation 01/10/16   Time 12   Period Weeks   Status On-going               Plan - 11/22/15 1152    Clinical Impression Statement continues to have "zings" of pain at the posterior knee with activity but able to tolerance all exercises despite this.  Decreased pain with a tibial IR/fibular head IR MWM today during a step up   PT Next Visit Plan Continue progressive strengthening/stretching to Lt knee - add work for hamstring - Soft tissue mobilization hamstrings and calf; modalities as indicated       Patient will benefit from skilled therapeutic intervention in order to improve the following deficits and impairments:     Visit Diagnosis: Knee pain, left  Stiffness of knee joint, left  Muscle weakness (generalized)  Abnormal gait     Problem List Patient Active Problem List   Diagnosis Date Noted  . 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 Status post revision of total replacement of left knee 09/20/2015  . Infection of total left knee replacement ([place]) 08/06/2015  . Effusion of left knee joint; questionable prosthetic joint infection 05/07/2015  . Knee effusion 05/07/2015  . Status post total left knee replacement 01/08/2015  . Osteoarthritis of left knee 12/25/2014  . Lumbar spondylosis L2-5 09/14/2012    Stark Bray, DPT, CMP 11/22/2015, 11:55 AM  [facility] [place], [place], [address] Phone: [phone]   Fax:  [phone]  Name: [name] MRN: [id number] Date of Birth: 1956-04-27

## 2015-11-25 ENCOUNTER — Ambulatory Visit (INDEPENDENT_AMBULATORY_CARE_PROVIDER_SITE_OTHER): Payer: Medicare HMO | Admitting: Rehabilitative and Restorative Service Providers"

## 2015-11-25 ENCOUNTER — Encounter: Payer: Self-pay | Admitting: Rehabilitative and Restorative Service Providers"

## 2015-11-25 DIAGNOSIS — M25662 Stiffness of left knee, not elsewhere classified: Secondary | ICD-10-CM

## 2015-11-25 DIAGNOSIS — R269 Unspecified abnormalities of gait and mobility: Secondary | ICD-10-CM | POA: Diagnosis not present

## 2015-11-25 DIAGNOSIS — M6281 Muscle weakness (generalized): Secondary | ICD-10-CM | POA: Diagnosis not present

## 2015-11-25 DIAGNOSIS — M25562 Pain in left knee: Secondary | ICD-10-CM | POA: Diagnosis not present

## 2015-11-25 NOTE — Therapy (Signed)
[place], [place], [address] Phone: [phone]   Fax:  [phone]  Physical Therapy Treatment  Patient Details  Name: [name] MRN: [id number] Date of Birth: [birth year] Referring Provider: Dr. Jean Rosenthal  Encounter Date: 11/25/2015      PT End of Session - 11/25/15 0949    Visit Number 15   Number of Visits 36   Date for PT Re-Evaluation 01/10/16   PT Start Time 0936   PT Stop Time 1032   PT Time Calculation (min) 56 min   Activity Tolerance Patient tolerated treatment well      Past Medical History  Diagnosis Date  . OCD (obsessive compulsive disorder)   . Anxiety   . History of kidney stones     x1 15 years ago  . Restless leg syndrome     takes requip daily  . Cataract     left eye  . Headache     hx of migraines-none recent  . Rheumatoid arthritis(714.0)     sees Dr Gerilyn Nestle @ [facility] in [place]  . Depression     takes Cymbalta daily  . ADD (attention deficit disorder)     takes Ritalin daily  . GERD (gastroesophageal reflux disease)     takes Omeprazole daily  . Enlarged prostate     takes Flomax daily   . Numbness and tingling     fingers bilat comes and goes   . Wears glasses   . History of blood transfusion   . Kidney stones   . Anemia     Past Surgical History  Procedure Laterality Date  . Total hip arthroplasty Right 01/2012    done in [facility]  . Hand tendon surgery Right 06/2012    done @ [facility] in [place]  . Rotator cuff repair Right 2008     done in [facility]  . Anterior lumbar fusion  09/13/2012    Procedure: ANTERIOR LUMBAR FUSION 1 LEVEL;  Surgeon: Faythe Ghee, MD;  Location: [place] [facility];  Service: Neurosurgery;  Laterality: N/A;  lumbar four-five  . Anterior lat lumbar fusion  09/13/2012    Procedure: ANTERIOR LATERAL LUMBAR FUSION 2 LEVELS;  Surgeon: Faythe Ghee, MD;  Location: [place] [facility];  Service: Neurosurgery;   Laterality: Right;  lumbar two three,three-four  . Lumbar percutaneous pedicle screw 2 level  09/13/2012    Procedure: LUMBAR PERCUTANEOUS PEDICLE SCREW 2 LEVEL;  Surgeon: Faythe Ghee, MD;  Location: [facility];  Service: Neurosurgery;  Laterality: Left;  left pedicle screws two -three,three-four  . Abdominal exposure  09/13/2012    Procedure: ABDOMINAL EXPOSURE;  Surgeon: Rosetta Posner, MD;  Location: [facility];  Service: Vascular;  Laterality: N/A;  . Total knee arthroplasty Left 01/08/2015    Procedure: LEFT TOTAL KNEE ARTHROPLASTY;  Surgeon: Mcarthur Rossetti, MD;  Location: [facility];  Service: Orthopedics;  Laterality: Left;  . I&d knee with poly exchange Left 05/07/2015    Procedure: IRRIGATION AND DEBRIDEMENT LEFT KNEE WITH POLY EXCHANGE;  Surgeon: Mcarthur Rossetti, MD;  Location: [facility];  Service: Orthopedics;  Laterality: Left;  . Knee arthroscopy Left 06/22/2015    Procedure: LEFT KNEE ARTHROSCOPY WITH DEBRIDEMENT, SYNOVECTOMY;  Surgeon: Mcarthur Rossetti, MD;  Location: [place];  Service: Orthopedics;  Laterality: Left;  . Excisional total knee arthroplasty with antibiotic spacers Left 08/06/2015    Procedure: EXCISION OF LEFT TOTAL KNEE ARTHROPLASTY WITH PLACEMENT OF ANTIBIOTIC  SPACERS;  Surgeon: Mcarthur Rossetti, MD;  [facility]: [facility];  Service: Orthopedics;  Laterality: Left;  . Total knee revision Left 09/20/2015    Procedure: RE-IMPLANT/REVISION LEFT TOTAL KNEE ARTHROPLASTY;  Surgeon: Mcarthur Rossetti, MD;  Location: [facility];  Service: Orthopedics;  Laterality: Left;    There were no vitals filed for this visit.      Subjective Assessment - 11/25/15 0950    Subjective Stepped in a hole in the yard yesterday with the Lt foot. He had some increased pain in the Lt knee but not as bad as he thought it would be. Still sore but not too bad. The zingers continue but seem to be less than they were. he feels he has localized the cause to the back of the thight just  above the knee joint.    Currently in Pain? Yes   Pain Score 3    Pain Location Knee   Pain Orientation Left   Pain Descriptors / Indicators Aching;Dull   Pain Type Chronic pain   Pain Onset More than a month ago   Pain Frequency Intermittent                         [facility] Adult PT Treatment/Exercise - 11/25/15 0001    Knee/Hip Exercises: Stretches   Passive Hamstring Stretch Left;3 reps;30 seconds  before and after US/manual work    Best boy Limitations HS stretch with slight abduction and slight adduction with pt working to fully extend the knee before Risk manager Left;30 seconds;3 reps   Soleus Stretch Left;30 seconds;3 reps   Knee/Hip Exercises: Aerobic   Elliptical L2 5 min    Electrical Stimulation   Electrical Stimulation Location Lt posterior    Electrical Stimulation Action IFC   Electrical Stimulation Parameters to tolerance   Electrical Stimulation Goals Tone;Pain   Ultrasound   Ultrasound Location distal hamstring    Ultrasound Parameters 1 mHz; 1.2 w/cm2; 100%; 10 min    Ultrasound Goals Other (Comment)  muscular tightness    Manual Therapy   Manual Therapy Soft tissue mobilization;Taping   Manual therapy comments pt prone   Soft tissue mobilization Lt hamstring distal and calf proximal    Myofascial Release hamstring                 PT Education - 11/25/15 1026    Education provided Yes   Education Details Increase HS stretch to 3-4 times/day 2-3 reps instead of 1x/day   Person(s) Educated Patient   Methods Explanation;Demonstration;Tactile cues;Verbal cues   Comprehension Verbalized understanding;Returned demonstration;Verbal cues required;Tactile cues required          PT Short Term Goals - 11/01/15 1107    PT SHORT TERM GOAL #1   Title Instruct pt in appropriate gait pattern with appropriate assistive device 11/29/15   Time 6   Period Weeks   Status Achieved   PT SHORT TERM GOAL #2   Title  Increase Lt knee ext to -8 to -6 deg 11/29/15   Time 6   Period Weeks   Status Achieved   PT SHORT TERM GOAL #3   Title Increase Lt knee flexion to 110-115 deg 11/29/15   Time 6   Period Weeks   Status Achieved   PT SHORT TERM GOAL #4   Title Increase strength Lt LE to 4+/5 throughout 11/29/15   Time 6   Period Weeks   Status Achieved  PT Long Term Goals - 11/08/15 1312    PT LONG TERM GOAL #1   Title Improve gait pattern with patient to demonstrate normal gait with minimal limp with appropriate assistive device 01/10/16   Time 12   Period Weeks   Status On-going   PT LONG TERM GOAL #2   Title Increase knee ROM to -5 to -7 degrees extension and 120-125 deg flexion 01/10/16   Time 12   Period Weeks   Status Partially Met  0 deg ext with quad set    PT LONG TERM GOAL #3   Title Patient reports return to normal functional activities including sitting; standing; walking for 30-60 min without difficulty 01/10/16   Time 12   Period Weeks   Status On-going   PT LONG TERM GOAL #4   Title I in HEP 01/10/16   Time 12   Period Weeks   Status On-going   PT LONG TERM GOAL #5   Title Improve FOTO to </= 49% limitation 01/10/16   Time 12   Period Weeks   Status On-going               Plan - 11/25/15 1035    Clinical Impression Statement Continued tightness in hamstrings. Will benefit form focus on increasing extensibility of hamstrings. Only stretching one time a day at home which he will now increase. Should benefit form increased focus on hamstring with modalities and manual work.    Rehab Potential Good   PT Frequency 2x / week   PT Duration 12 weeks   PT Treatment/Interventions Patient/family education;ADLs/Self Care Home Management;Therapeutic exercise;Therapeutic activities;Manual techniques;Neuromuscular re-education;Dry needling;Cryotherapy;Electrical Stimulation;Moist Heat;Ultrasound;Gait training;Functional mobility training;Balance training   PT Next Visit  Plan Continue progressive strengthening/stretching to Lt knee - add work for hamstring - Soft tissue mobilization hamstrings and calf/modalities    PT Home Exercise Plan HEP myofacial work increase frequency of HS stretches    Consulted and Agree with Plan of Care Patient      Patient will benefit from skilled therapeutic intervention in order to improve the following deficits and impairments:  Postural dysfunction, Improper body mechanics, Abnormal gait, Increased fascial restricitons, Decreased range of motion, Decreased mobility, Decreased strength, Decreased endurance, Decreased activity tolerance, Pain  Visit Diagnosis: Knee pain, left  Stiffness of knee joint, left  Muscle weakness (generalized)  Abnormal gait     Problem List Patient Active Problem List   Diagnosis Date Noted  . 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 Status post revision of total replacement of left knee 09/20/2015  . Infection of total left knee replacement ([facility]) 08/06/2015  . Effusion of left knee joint; questionable prosthetic joint infection 05/07/2015  . Knee effusion 05/07/2015  . Status post total left knee replacement 01/08/2015  . Osteoarthritis of left knee 12/25/2014  . Lumbar spondylosis L2-5 09/14/2012    Ian Cavey Nilda Simmer PT, MPH  11/25/2015, 10:37 AM  [facility] [place], [place], [address] Phone: [phone]   Fax:  [phone]  Name: [name] MRN: [id number] Date of Birth: [birth year]

## 2015-11-29 ENCOUNTER — Encounter: Payer: Medicare HMO | Admitting: Physical Therapy

## 2015-11-30 ENCOUNTER — Ambulatory Visit (INDEPENDENT_AMBULATORY_CARE_PROVIDER_SITE_OTHER): Payer: Medicare HMO | Admitting: Physical Therapy

## 2015-11-30 DIAGNOSIS — R6889 Other general symptoms and signs: Secondary | ICD-10-CM

## 2015-11-30 DIAGNOSIS — M25562 Pain in left knee: Secondary | ICD-10-CM | POA: Diagnosis not present

## 2015-11-30 DIAGNOSIS — R269 Unspecified abnormalities of gait and mobility: Secondary | ICD-10-CM | POA: Diagnosis not present

## 2015-11-30 DIAGNOSIS — M6281 Muscle weakness (generalized): Secondary | ICD-10-CM

## 2015-11-30 DIAGNOSIS — M25662 Stiffness of left knee, not elsewhere classified: Secondary | ICD-10-CM

## 2015-11-30 DIAGNOSIS — R531 Weakness: Secondary | ICD-10-CM

## 2015-11-30 DIAGNOSIS — R29898 Other symptoms and signs involving the musculoskeletal system: Secondary | ICD-10-CM

## 2015-11-30 DIAGNOSIS — Z7409 Other reduced mobility: Secondary | ICD-10-CM

## 2015-11-30 NOTE — Therapy (Signed)
[place], [place], [address] Phone: [phone]   Fax:  [phone]  Physical Therapy Treatment  Patient Details  Name: [name] MRN: [id number] Date of Birth: Dec 19, 1955 Referring Provider: Dr. Ninfa Linden  Encounter Date: 11/30/2015      PT End of Session - 11/30/15 0811    Visit Number 16   Number of Visits 36   Date for PT Re-Evaluation 01/10/16   PT Start Time 0805  pt arrived late   PT Stop Time 0902   PT Time Calculation (min) 57 min      Past Medical History  Diagnosis Date  . OCD (obsessive compulsive disorder)   . Anxiety   . History of kidney stones     x1 15 years ago  . Restless leg syndrome     takes requip daily  . Cataract     left eye  . Headache     hx of migraines-none recent  . Rheumatoid arthritis(714.0)     sees Dr Gerilyn Nestle @ [facility] in [place]  . Depression     takes Cymbalta daily  . ADD (attention deficit disorder)     takes Ritalin daily  . GERD (gastroesophageal reflux disease)     takes Omeprazole daily  . Enlarged prostate     takes Flomax daily   . Numbness and tingling     fingers bilat comes and goes   . Wears glasses   . History of blood transfusion   . Kidney stones   . Anemia     Past Surgical History  Procedure Laterality Date  . Total hip arthroplasty Right 01/2012    done in [facility]  . Hand tendon surgery Right 06/2012    done @ [facility] in [place]  . Rotator cuff repair Right 2008     done in [facility]  . Anterior lumbar fusion  09/13/2012    Procedure: ANTERIOR LUMBAR FUSION 1 LEVEL;  Surgeon: Faythe Ghee, MD;  Location: [place] [facility];  Service: Neurosurgery;  Laterality: N/A;  lumbar four-five  . Anterior lat lumbar fusion  09/13/2012    Procedure: ANTERIOR LATERAL LUMBAR FUSION 2 LEVELS;  Surgeon: Faythe Ghee, MD;  Location: [place] NE[facility];  Service: Neurosurgery;  Laterality: Right;  lumbar two three,three-four  .  Lumbar percutaneous pedicle screw 2 level  09/13/2012    Procedure: LUMBAR PERCUTANEOUS PEDICLE SCREW 2 LEVEL;  Surgeon: Faythe Ghee, MD;  Location: [facility];  Service: Neurosurgery;  Laterality: Left;  left pedicle screws two -three,three-four  . Abdominal exposure  09/13/2012    Procedure: ABDOMINAL EXPOSURE;  Surgeon: Rosetta Posner, MD;  Location: [facility];  Service: Vascular;  Laterality: N/A;  . Total knee arthroplasty Left 01/08/2015    Procedure: LEFT TOTAL KNEE ARTHROPLASTY;  Surgeon: Mcarthur Rossetti, MD;  [facility]: [facility];  Service: Orthopedics;  Laterality: Left;  . I&d knee with poly exchange Left 05/07/2015    Procedure: IRRIGATION AND DEBRIDEMENT LEFT KNEE WITH POLY EXCHANGE;  Surgeon: Mcarthur Rossetti, MD;  [facility]: [facility];  Service: Orthopedics;  Laterality: Left;  . Knee arthroscopy Left 06/22/2015    Procedure: LEFT KNEE ARTHROSCOPY WITH DEBRIDEMENT, SYNOVECTOMY;  Surgeon: Mcarthur Rossetti, MD;  Location: [place];  Service: Orthopedics;  Laterality: Left;  . Excisional total knee arthroplasty with antibiotic spacers Left 08/06/2015    Procedure: EXCISION OF LEFT TOTAL KNEE ARTHROPLASTY WITH PLACEMENT OF ANTIBIOTIC SPACERS;  Surgeon: Lind Guest  Ninfa Linden, MD;  Location: [facility];  Service: Orthopedics;  Laterality: Left;  . Total knee revision Left 09/20/2015    Procedure: RE-IMPLANT/REVISION LEFT TOTAL KNEE ARTHROPLASTY;  Surgeon: Mcarthur Rossetti, MD;  Location: [place];  Service: Orthopedics;  Laterality: Left;    There were no vitals filed for this visit.      Subjective Assessment - 11/30/15 0811    Subjective Pt reports he saw MD; was pleased with progress.  Returns to MD in month.  Pt reports that he still has zingers, but they are not as frequent.  Pt states he is struggling with stairs and would like to improve those.    Currently in Pain? Yes   Pain Score 3    Pain Location Knee   Pain Orientation Left   Pain Descriptors / Indicators Dull    Aggravating Factors  being still too long   Pain Relieving Factors ice, rest             [facility] PT Assessment - 11/30/15 0001    Assessment   Medical Diagnosis Lt TKA   Referring Provider Dr. Ninfa Linden   Onset Date/Surgical Date 09/20/15   Hand Dominance Right   Next MD Visit 12/2015          [facility] Adult PT Treatment/Exercise - 11/30/15 0001    Ambulation/Gait   Ambulation Distance (Feet) --  ~400   Gait Pattern Left circumduction;Antalgic;Decreased stance time - left  no arm swing   Gait Comments tactile cues for arm swing; VC to decrease Rt stance time and decrease circumduction.    Knee/Hip Exercises: Stretches   Passive Hamstring Stretch Left;2 reps;30 seconds   Quad Stretch Left;2 reps;30 seconds   Gastroc Stretch Right;Left;2 reps;30 seconds   Soleus Stretch Right;Left;2 reps;30 seconds   Knee/Hip Exercises: Aerobic   Elliptical L3: 5 min    Knee/Hip Exercises: Machines for Strengthening   Cybex Knee Extension LLE x 10 reps x 2 sets with 2 plates    Total Gym Leg Press LLE only x 10 reps x 2 sets with 5 plates.    Knee/Hip Exercises: Standing   Forward Step Up Hand Hold: 1;Step Height: 6"  14 stairs x 5 reps   Step Down Left;Hand Hold: 1;Step Height: 6"  14 stairs x 5 reps   Step Down Limitations (also performed backwards step up with Rt hand on rail x 10 reps (challenging on LLE)   Other Standing Knee Exercises Marching in place VC for high knees and relaxed ankles, even stance time.    Human resources officer Parameters to tolerance    Electrical Stimulation Goals Tone;Pain   Vasopneumatic   Number Minutes Vasopneumatic  15 minutes   Vasopnuematic Location  Knee   Vasopneumatic Pressure Medium   Vasopneumatic Temperature  3*                  PT Short Term Goals - 11/01/15 1107    PT SHORT TERM GOAL #1   Title Instruct pt in  appropriate gait pattern with appropriate assistive device 11/29/15   Time 6   Period Weeks   Status Achieved   PT SHORT TERM GOAL #2   Title Increase Lt knee ext to -8 to -6 deg 11/29/15   Time 6   Period Weeks   Status Achieved   PT SHORT TERM GOAL #3   Title Increase Lt  knee flexion to 110-115 deg 11/29/15   Time 6   Period Weeks   Status Achieved   PT SHORT TERM GOAL #4   Title Increase strength Lt LE to 4+/5 throughout 11/29/15   Time 6   Period Weeks   Status Achieved           PT Long Term Goals - 11/08/15 1312    PT LONG TERM GOAL #1   Title Improve gait pattern with patient to demonstrate normal gait with minimal limp with appropriate assistive device 01/10/16   Time 12   Period Weeks   Status On-going   PT LONG TERM GOAL #2   Title Increase knee ROM to -5 to -7 degrees extension and 120-125 deg flexion 01/10/16   Time 12   Period Weeks   Status Partially Met  0 deg ext with quad set    PT LONG TERM GOAL #3   Title Patient reports return to normal functional activities including sitting; standing; walking for 30-60 min without difficulty 01/10/16   Time 12   Period Weeks   Status On-going   PT LONG TERM GOAL #4   Title I in HEP 01/10/16   Time 12   Period Weeks   Status On-going   PT LONG TERM GOAL #5   Title Improve FOTO to </= 49% limitation 01/10/16   Time 12   Period Weeks   Status On-going               Plan - 11/30/15 1001    Clinical Impression Statement Pt demonstrated improved quality of descending stairs with repetition and VC; pt initially demo compensatory strategies in Lt hip due to fear of "zingers" during these motions.  Pt's gait quality also improved with constant VC and tactile cues.  Pt making progress towards remaining goals.    Rehab Potential Good   PT Frequency 2x / week   PT Duration 12 weeks   PT Treatment/Interventions Patient/family education;ADLs/Self Care Home Management;Therapeutic exercise;Therapeutic activities;Manual  techniques;Neuromuscular re-education;Dry needling;Cryotherapy;Electrical Stimulation;Moist Heat;Ultrasound;Gait training;Functional mobility training;Balance training   PT Next Visit Plan Continue progressive strengthening/stretching to Lt knee - add work for hamstring - Soft tissue mobilization hamstrings and calf/modalities    Consulted and Agree with Plan of Care Patient      Patient will benefit from skilled therapeutic intervention in order to improve the following deficits and impairments:  Postural dysfunction, Improper body mechanics, Abnormal gait, Increased fascial restricitons, Decreased range of motion, Decreased mobility, Decreased strength, Decreased endurance, Decreased activity tolerance, Pain  Visit Diagnosis: Knee pain, left  Stiffness of knee joint, left  Muscle weakness (generalized)  Abnormal gait  Weakness of left lower extremity  Decreased strength, endurance, and mobility     Problem List Patient Active Problem List   Diagnosis Date Noted  . 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 Status post revision of total replacement of left knee 09/20/2015  . 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 Effusion of left knee joint; questionable prosthetic joint infection 05/07/2015  . Knee effusion 05/07/2015  . Status post total left knee replacement 01/08/2015  . Osteoarthritis of left knee 12/25/2014  . Lumbar spondylosis L2-5 09/14/2012   Mayer Camel, PTA 11/30/2015 10:10 AM  [facility] [place] [address] [place], [place], [address] Phone: [phone]   Fax:  [phone]  Name: [name] MRN: [id number] Date of Birth:  01/12/1956     

## 2015-12-02 ENCOUNTER — Encounter: Payer: Medicare HMO | Admitting: Physical Therapy

## 2015-12-03 ENCOUNTER — Ambulatory Visit (INDEPENDENT_AMBULATORY_CARE_PROVIDER_SITE_OTHER): Payer: Medicare HMO | Admitting: Physical Therapy

## 2015-12-03 DIAGNOSIS — M25662 Stiffness of left knee, not elsewhere classified: Secondary | ICD-10-CM

## 2015-12-03 DIAGNOSIS — M25562 Pain in left knee: Secondary | ICD-10-CM

## 2015-12-03 DIAGNOSIS — R2689 Other abnormalities of gait and mobility: Secondary | ICD-10-CM | POA: Diagnosis not present

## 2015-12-03 DIAGNOSIS — M6281 Muscle weakness (generalized): Secondary | ICD-10-CM

## 2015-12-03 DIAGNOSIS — R29898 Other symptoms and signs involving the musculoskeletal system: Secondary | ICD-10-CM

## 2015-12-03 NOTE — Therapy (Signed)
[place], [place], [address] Phone: [phone]   Fax:  [phone]  Physical Therapy Treatment  Patient Details  Name: [name] MRN: [id number] Date of Birth: July 14, 1956 Referring Provider: Dr. Ninfa Linden   Encounter Date: 12/03/2015      PT End of Session - 12/03/15 1038    Visit Number 17   Number of Visits 36   Date for PT Re-Evaluation 01/10/16   PT Start Time 1018   PT Stop Time 1100   PT Time Calculation (min) 42 min   Activity Tolerance Patient tolerated treatment well;No increased pain      Past Medical History  Diagnosis Date  . OCD (obsessive compulsive disorder)   . Anxiety   . History of kidney stones     x1 15 years ago  . Restless leg syndrome     takes requip daily  . Cataract     left eye  . Headache     hx of migraines-none recent  . Rheumatoid arthritis(714.0)     sees Dr Gerilyn Nestle @ [facility] in [place]  . Depression     takes Cymbalta daily  . ADD (attention deficit disorder)     takes Ritalin daily  . GERD (gastroesophageal reflux disease)     takes Omeprazole daily  . Enlarged prostate     takes Flomax daily   . Numbness and tingling     fingers bilat comes and goes   . Wears glasses   . History of blood transfusion   . Kidney stones   . Anemia     Past Surgical History  Procedure Laterality Date  . Total hip arthroplasty Right 01/2012    done in [facility]  . Hand tendon surgery Right 06/2012    done @ [facility] in [place]  . Rotator cuff repair Right 2008     done in [facility]  . Anterior lumbar fusion  09/13/2012    Procedure: ANTERIOR LUMBAR FUSION 1 LEVEL;  Surgeon: Faythe Ghee, MD;  Location: [place] [facility];  Service: Neurosurgery;  Laterality: N/A;  lumbar four-five  . Anterior lat lumbar fusion  09/13/2012    Procedure: ANTERIOR LATERAL LUMBAR FUSION 2 LEVELS;  Surgeon: Faythe Ghee, MD;  Location: [place] [facility];  Service: Neurosurgery;   Laterality: Right;  lumbar two three,three-four  . Lumbar percutaneous pedicle screw 2 level  09/13/2012    Procedure: LUMBAR PERCUTANEOUS PEDICLE SCREW 2 LEVEL;  Surgeon: Faythe Ghee, MD;  Location: [facility];  Service: Neurosurgery;  Laterality: Left;  left pedicle screws two -three,three-four  . Abdominal exposure  09/13/2012    Procedure: ABDOMINAL EXPOSURE;  Surgeon: Rosetta Posner, MD;  Location: [facility];  Service: Vascular;  Laterality: N/A;  . Total knee arthroplasty Left 01/08/2015    Procedure: LEFT TOTAL KNEE ARTHROPLASTY;  Surgeon: Mcarthur Rossetti, MD;  Location: [facility];  Service: Orthopedics;  Laterality: Left;  . I&d knee with poly exchange Left 05/07/2015    Procedure: IRRIGATION AND DEBRIDEMENT LEFT KNEE WITH POLY EXCHANGE;  Surgeon: Mcarthur Rossetti, MD;  Location: [facility];  Service: Orthopedics;  Laterality: Left;  . Knee arthroscopy Left 06/22/2015    Procedure: LEFT KNEE ARTHROSCOPY WITH DEBRIDEMENT, SYNOVECTOMY;  Surgeon: Mcarthur Rossetti, MD;  Location: [place];  Service: Orthopedics;  Laterality: Left;  . Excisional total knee arthroplasty with antibiotic spacers Left 08/06/2015    Procedure: EXCISION OF LEFT TOTAL KNEE ARTHROPLASTY WITH PLACEMENT  OF ANTIBIOTIC SPACERS;  Surgeon: Mcarthur Rossetti, MD;  [facility];  Service: Orthopedics;  Laterality: Left;  . Total knee revision Left 09/20/2015    Procedure: RE-IMPLANT/REVISION LEFT TOTAL KNEE ARTHROPLASTY;  Surgeon: Mcarthur Rossetti, MD;  Location: [place];  Service: Orthopedics;  Laterality: Left;    There were no vitals filed for this visit.      Subjective Assessment - 12/03/15 1040    Subjective Pt reports no new changes since last visit.     Pertinent History RA; Rt THA 6/13; hand tendon repair 11/13; lumbar fusion 3 levels rod/plate 1/14; Rt RCR > 10 yrs ago   Currently in Pain? Yes   Pain Score 2    Pain Location Knee   Pain Orientation Left   Pain Descriptors / Indicators  Aching  stiff             [facility] PT Assessment - 12/03/15 0001    Assessment   Medical Diagnosis Lt TKA   Referring Provider Dr. Ninfa Linden    Onset Date/Surgical Date 09/20/15   Hand Dominance Right   Next MD Visit 12/2015   AROM   Left Knee Extension 0   Left Knee Flexion 120          [facility] Adult PT Treatment/Exercise - 12/03/15 0001    Knee/Hip Exercises: Stretches   Passive Hamstring Stretch 4 reps;30 seconds;Left   Quad Stretch Left;2 reps;30 seconds   Gastroc Stretch Right;Left;30 seconds;4 reps   Soleus Stretch Right;Left;4 reps;30 seconds   Knee/Hip Exercises: Aerobic   Elliptical L3: 6 min   Other Aerobic 2 laps around gym after manual therapy    Knee/Hip Exercises: Machines for Strengthening   Cybex Knee Extension LLE x 10 reps with 2plates.  then BLE extension with 3 plates and controlled descent with LLE x 10   Knee/Hip Exercises: Standing   Lateral Step Up Left;1 set;10 reps;Hand Hold: 2  10" step, challenging   Forward Step Up Hand Hold: 1;15 reps;Left  10" step   Knee/Hip Exercises: Prone   Hamstring Curl 2 sets;10 reps  3# at ankle   Prone Knee Hang 1 minute  2 reps with 2# at ankle   Ultrasound   Ultrasound Location --   Ultrasound Parameters --   Ultrasound Goals --   Manual Therapy   Manual Therapy Soft tissue mobilization   Manual therapy comments pt prone   Soft tissue mobilization Edge tool assistance followed by TPR and muscle stripping to Lt hamstrings to decrease fascial restrictions and pain.                   PT Short Term Goals - 11/01/15 1107    PT SHORT TERM GOAL #1   Title Instruct pt in appropriate gait pattern with appropriate assistive device 11/29/15   Time 6   Period Weeks   Status Achieved   PT SHORT TERM GOAL #2   Title Increase Lt knee ext to -8 to -6 deg 11/29/15   Time 6   Period Weeks   Status Achieved   PT SHORT TERM GOAL #3   Title Increase Lt knee flexion to 110-115 deg 11/29/15   Time 6   Period Weeks    Status Achieved   PT SHORT TERM GOAL #4   Title Increase strength Lt LE to 4+/5 throughout 11/29/15   Time 6   Period Weeks   Status Achieved           PT Long Term  Goals - 12/03/15 1110    PT LONG TERM GOAL #1   Title Improve gait pattern with patient to demonstrate normal gait with minimal limp with appropriate assistive device 01/10/16   Time 12   Period Weeks   Status Achieved  minor limp, improving gait quality.    PT LONG TERM GOAL #2   Title Increase knee ROM to -5 to -7 degrees extension and 120-125 deg flexion 01/10/16   Time 12   Period Weeks   Status Achieved   PT LONG TERM GOAL #3   Title Patient reports return to normal functional activities including sitting; standing; walking for 30-60 min without difficulty 01/10/16   Time 12   Period Weeks   Status On-going   PT LONG TERM GOAL #4   Title I in HEP 01/10/16   Time 12   Period Weeks   Status On-going   PT LONG TERM GOAL #5   Title Improve FOTO to </= 49% limitation 01/10/16   Time 12   Period Weeks   Status On-going               Plan - 12/03/15 1106    Clinical Impression Statement Pt tolerated increased step height without increase in pain; lateral step up continues to be challening.  Pt demonstrated improved Lt knee ROM. Pt declined use of estim/vaso at end; noted LLE felt great after exercise and manual therapy.  Pt has met LTG 1 and 2.    Rehab Potential Good   PT Frequency 2x / week   PT Duration 12 weeks   PT Treatment/Interventions Patient/family education;ADLs/Self Care Home Management;Therapeutic exercise;Therapeutic activities;Manual techniques;Neuromuscular re-education;Dry needling;Cryotherapy;Electrical Stimulation;Moist Heat;Ultrasound;Gait training;Functional mobility training;Balance training   PT Next Visit Plan Continue progressive strengthening/stretching to Lt knee.  Assess goals and need for further therapy vs readiness for d/c to HEP.    Consulted and Agree with Plan of Care  Patient      Patient will benefit from skilled therapeutic intervention in order to improve the following deficits and impairments:  Postural dysfunction, Improper body mechanics, Abnormal gait, Increased fascial restricitons, Decreased range of motion, Decreased mobility, Decreased strength, Decreased endurance, Decreased activity tolerance, Pain  Visit Diagnosis: Knee pain, left  Stiffness of knee joint, left  Muscle weakness (generalized)  Other abnormalities of gait and mobility  Weakness of left lower extremity     Problem List Patient Active Problem List   Diagnosis Date Noted  . 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 Status post revision of total replacement of left knee 09/20/2015  . Infection of total left knee replacement ([place]) 08/06/2015  . Effusion of left knee joint; questionable prosthetic joint infection 05/07/2015  . Knee effusion 05/07/2015  . Status post total left knee replacement 01/08/2015  . Osteoarthritis of left knee 12/25/2014  . Lumbar spondylosis L2-5 09/14/2012   Kerin Perna, PTA 12/03/2015 11:21 AM  [place], [place], [address] Phone: [phone]   Fax:  [phone]  Name: [name] MRN: [id number] Date of Birth: Feb 19, 1956

## 2015-12-06 ENCOUNTER — Encounter: Payer: Medicare HMO | Admitting: Physical Therapy

## 2015-12-09 ENCOUNTER — Encounter: Payer: Medicare HMO | Admitting: Physical Therapy

## 2015-12-15 ENCOUNTER — Encounter: Payer: Self-pay | Admitting: Rehabilitative and Restorative Service Providers"

## 2015-12-15 ENCOUNTER — Ambulatory Visit (INDEPENDENT_AMBULATORY_CARE_PROVIDER_SITE_OTHER): Payer: Medicare HMO | Admitting: Rehabilitative and Restorative Service Providers"

## 2015-12-15 DIAGNOSIS — R2689 Other abnormalities of gait and mobility: Secondary | ICD-10-CM

## 2015-12-15 DIAGNOSIS — M6281 Muscle weakness (generalized): Secondary | ICD-10-CM | POA: Diagnosis not present

## 2015-12-15 DIAGNOSIS — M25562 Pain in left knee: Secondary | ICD-10-CM

## 2015-12-15 DIAGNOSIS — M25662 Stiffness of left knee, not elsewhere classified: Secondary | ICD-10-CM | POA: Diagnosis not present

## 2015-12-15 NOTE — Patient Instructions (Signed)

## 2015-12-15 NOTE — Therapy (Addendum)
[place], [place], [address] Phone: [phone]   Fax:  [phone]  Physical Therapy Treatment  Patient Details  Name: [name] MRN: [id number] Date of Birth: 1956-06-02 Referring Provider: Dr. Ninfa Linden   Encounter Date: 12/15/2015      PT End of Session - 12/15/15 0852    Visit Number 18   Number of Visits 36   Date for PT Re-Evaluation 01/10/16   PT Start Time 0849   PT Stop Time 0942   PT Time Calculation (min) 53 min   Activity Tolerance Patient tolerated treatment well      Past Medical History  Diagnosis Date  . OCD (obsessive compulsive disorder)   . Anxiety   . History of kidney stones     x1 15 years ago  . Restless leg syndrome     takes requip daily  . Cataract     left eye  . Headache     hx of migraines-none recent  . Rheumatoid arthritis(714.0)     sees Dr Gerilyn Nestle @ [facility] in [place]  . Depression     takes Cymbalta daily  . ADD (attention deficit disorder)     takes Ritalin daily  . GERD (gastroesophageal reflux disease)     takes Omeprazole daily  . Enlarged prostate     takes Flomax daily   . Numbness and tingling     fingers bilat comes and goes   . Wears glasses   . History of blood transfusion   . Kidney stones   . Anemia     Past Surgical History  Procedure Laterality Date  . Total hip arthroplasty Right 01/2012    done in [facility]  . Hand tendon surgery Right 06/2012    done @ [facility] in [place]  . Rotator cuff repair Right 2008     done in [facility]  . Anterior lumbar fusion  09/13/2012    Procedure: ANTERIOR LUMBAR FUSION 1 LEVEL;  Surgeon: Faythe Ghee, MD;  Location: [place] [facility];  Service: Neurosurgery;  Laterality: N/A;  lumbar four-five  . Anterior lat lumbar fusion  09/13/2012    Procedure: ANTERIOR LATERAL LUMBAR FUSION 2 LEVELS;  Surgeon: Faythe Ghee, MD;  Location: [place] [facility];  Service: Neurosurgery;  Laterality: Right;   lumbar two three,three-four  . Lumbar percutaneous pedicle screw 2 level  09/13/2012    Procedure: LUMBAR PERCUTANEOUS PEDICLE SCREW 2 LEVEL;  Surgeon: Faythe Ghee, MD;  [facility]: [facility];  Service: Neurosurgery;  Laterality: Left;  left pedicle screws two -three,three-four  . Abdominal exposure  09/13/2012    Procedure: ABDOMINAL EXPOSURE;  Surgeon: Rosetta Posner, MD;  Location: [facility];  Service: Vascular;  Laterality: N/A;  . Total knee arthroplasty Left 01/08/2015    Procedure: LEFT TOTAL KNEE ARTHROPLASTY;  Surgeon: Mcarthur Rossetti, MD;  [facility];  Service: Orthopedics;  Laterality: Left;  . I&d knee with poly exchange Left 05/07/2015    Procedure: IRRIGATION AND DEBRIDEMENT LEFT KNEE WITH POLY EXCHANGE;  Surgeon: Mcarthur Rossetti, MD;  [facility];  Service: Orthopedics;  Laterality: Left;  . Knee arthroscopy Left 06/22/2015    Procedure: LEFT KNEE ARTHROSCOPY WITH DEBRIDEMENT, SYNOVECTOMY;  Surgeon: Mcarthur Rossetti, MD;  Location: [place];  Service: Orthopedics;  Laterality: Left;  . Excisional total knee arthroplasty with antibiotic spacers Left 08/06/2015    Procedure: EXCISION OF LEFT TOTAL KNEE ARTHROPLASTY WITH PLACEMENT OF ANTIBIOTIC  SPACERS;  Surgeon: Mcarthur Rossetti, MD;  [facility];  Service: Orthopedics;  Laterality: Left;  . Total knee revision Left 09/20/2015    Procedure: RE-IMPLANT/REVISION LEFT TOTAL KNEE ARTHROPLASTY;  Surgeon: Mcarthur Rossetti, MD;  Location: [place];  Service: Orthopedics;  Laterality: Left;    There were no vitals filed for this visit.      Subjective Assessment - 12/15/15 0853    Subjective Pt went on vacation to beach and Lt knee felt "awesome".   Since returning home he has been working hard around house, so he has some soreness.  "I feel like I've really turned a corner during these last two weeks"   Currently in Pain? Yes   Pain Score 2    Pain Location Knee   Pain Orientation Left   Pain  Descriptors / Indicators Sore   Aggravating Factors  stairs/ ladders   Pain Relieving Factors rest, ice             [facility] PT Assessment - 12/15/15 0001    Assessment   Medical Diagnosis Lt TKA                     [facility] Adult PT Treatment/Exercise - 12/15/15 0001    Knee/Hip Exercises: Stretches   Gastroc Stretch Right;Left;30 seconds;4 reps   Soleus Stretch Right;Left;4 reps;30 seconds   Knee/Hip Exercises: Aerobic   Elliptical L3: 5 min    Knee/Hip Exercises: Machines for Strengthening   Cybex Knee Extension LLE only: 2 plates x 10, 3 plates BLE extension, LLE controlled descent x 10, repeated with 4 plates.    Knee/Hip Exercises: Standing   Forward Lunges Left;1 set;10 reps   Side Lunges Left;10 reps   Manual Therapy   Manual Therapy Soft tissue mobilization   Manual therapy comments pt prone   Soft tissue mobilization deep tissue work through hamstrings Lt LE    Myofascial Release hamstring           Trigger Point Dry Needling - 12/15/15 1048    Consent Given? Yes   Education Handout Provided Yes                PT Short Term Goals - 11/01/15 1107    PT SHORT TERM GOAL #1   Title Instruct pt in appropriate gait pattern with appropriate assistive device 11/29/15   Time 6   Period Weeks   Status Achieved   PT SHORT TERM GOAL #2   Title Increase Lt knee ext to -8 to -6 deg 11/29/15   Time 6   Period Weeks   Status Achieved   PT SHORT TERM GOAL #3   Title Increase Lt knee flexion to 110-115 deg 11/29/15   Time 6   Period Weeks   Status Achieved   PT SHORT TERM GOAL #4   Title Increase strength Lt LE to 4+/5 throughout 11/29/15   Time 6   Period Weeks   Status Achieved           PT Long Term Goals - 12/15/15 0932    PT LONG TERM GOAL #1   Title Improve gait pattern with patient to demonstrate normal gait with minimal limp with appropriate assistive device 01/10/16   Time 12   Period Weeks   Status Achieved   PT LONG TERM GOAL #2    Title Increase knee ROM to -5 to -7 degrees extension and 120-125 deg flexion 01/10/16   Time 12   Period Weeks  Status Achieved   PT LONG TERM GOAL #3   Title Patient reports return to normal functional activities including sitting; standing; walking for 30-60 min without difficulty 01/10/16   Time 12   Period Weeks   Status Achieved   PT LONG TERM GOAL #4   Title I in HEP 01/10/16   Time 12   Period Weeks   Status Achieved   PT LONG TERM GOAL #5   Title Improve FOTO to </= 49% limitation 01/10/16   Time 12   Period Weeks   Status Not Met       G-code: Current Status - CK Goal status - CK D/C Status - CK       Patient will benefit from skilled therapeutic intervention in order to improve the following deficits and impairments:     Visit Diagnosis: Knee pain, left  Stiffness of knee joint, left  Muscle weakness (generalized)  Other abnormalities of gait and mobility     Problem List Patient Active Problem List   Diagnosis Date Noted  . 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 Status post revision of total replacement of left knee 09/20/2015  . Infection of total left knee replacement ([place]) 08/06/2015  . Effusion of left knee joint; questionable prosthetic joint infection 05/07/2015  . Knee effusion 05/07/2015  . Status post total left knee replacement 01/08/2015  . Osteoarthritis of left knee 12/25/2014  . Lumbar spondylosis L2-5 09/14/2012    [name] PT, MPH  12/15/2015, 10:51 AM  [facility] [place], [place], [address] Phone: [phone]   Fax:  [phone]  Name: [name] MRN: [id number] Date of Birth: [birth year]   PHYSICAL THERAPY DISCHARGE SUMMARY  Visits from Start of Care: 18  Current functional level related to goals / functional outcomes: Good gains in  ROM; strength; improvement in giat and return to normal functional activity level.    Remaining deficits: Tightness in hamstrings; some continued pain    Education / Equipment: HEP   Plan: Patient agrees to discharge.  Patient goals were partially met. Patient is being discharged due to being pleased with the current functional level.  ?????     [name] P. Helene Kelp PT, MPH 12/15/2015 10:52 AM

## 2016-03-17 ENCOUNTER — Other Ambulatory Visit (HOSPITAL_BASED_OUTPATIENT_CLINIC_OR_DEPARTMENT_OTHER): Payer: Self-pay | Admitting: Orthopaedic Surgery

## 2016-03-17 DIAGNOSIS — Z09 Encounter for follow-up examination after completed treatment for conditions other than malignant neoplasm: Secondary | ICD-10-CM

## 2016-03-20 ENCOUNTER — Ambulatory Visit (HOSPITAL_BASED_OUTPATIENT_CLINIC_OR_DEPARTMENT_OTHER)
Admission: RE | Admit: 2016-03-20 | Discharge: 2016-03-20 | Disposition: A | Discharge status: Home / Self Care | Payer: Medicare HMO | Source: Ambulatory Visit | Attending: Orthopaedic Surgery | Admitting: Orthopaedic Surgery

## 2016-03-20 DIAGNOSIS — Z09 Encounter for follow-up examination after completed treatment for conditions other than malignant neoplasm: Secondary | ICD-10-CM | POA: Insufficient documentation

## 2016-03-20 DIAGNOSIS — Z96652 Presence of left artificial knee joint: Secondary | ICD-10-CM | POA: Diagnosis not present

## 2016-09-22 ENCOUNTER — Other Ambulatory Visit (INDEPENDENT_AMBULATORY_CARE_PROVIDER_SITE_OTHER): Payer: Self-pay

## 2016-09-22 DIAGNOSIS — Z96652 Presence of left artificial knee joint: Secondary | ICD-10-CM

## 2016-09-25 ENCOUNTER — Inpatient Hospital Stay (HOSPITAL_BASED_OUTPATIENT_CLINIC_OR_DEPARTMENT_OTHER)
Admission: RE | Admit: 2016-09-25 | Discharge: 2016-09-25 | Disposition: A | Discharge status: Home / Self Care | Payer: Medicare HMO | Source: Ambulatory Visit | Attending: Orthopaedic Surgery | Admitting: Orthopaedic Surgery

## 2016-09-25 ENCOUNTER — Ambulatory Visit (INDEPENDENT_AMBULATORY_CARE_PROVIDER_SITE_OTHER): Payer: Self-pay | Admitting: Orthopaedic Surgery

## 2016-10-02 HISTORY — PX: SHOULDER ARTHROSCOPY: SHX128

## 2016-10-04 ENCOUNTER — Other Ambulatory Visit: Payer: Self-pay | Admitting: Neurosurgery

## 2016-10-04 DIAGNOSIS — M4316 Spondylolisthesis, lumbar region: Secondary | ICD-10-CM

## 2016-10-12 ENCOUNTER — Ambulatory Visit
Admission: RE | Admit: 2016-10-12 | Discharge: 2016-10-12 | Disposition: A | Discharge status: Home / Self Care | Payer: Medicare HMO | Source: Ambulatory Visit | Attending: Neurosurgery | Admitting: Neurosurgery

## 2016-10-12 VITALS — BP 96/65 | HR 69

## 2016-10-12 DIAGNOSIS — M47816 Spondylosis without myelopathy or radiculopathy, lumbar region: Secondary | ICD-10-CM

## 2016-10-12 DIAGNOSIS — M4316 Spondylolisthesis, lumbar region: Secondary | ICD-10-CM

## 2016-10-12 MED ORDER — ONDANSETRON HCL 4 MG/2ML IJ SOLN
4.0000 mg | Freq: Once | INTRAMUSCULAR | Status: AC
Start: 2016-10-12 — End: 2016-10-12
  Administered 2016-10-12: 4 mg via INTRAMUSCULAR

## 2016-10-12 MED ORDER — MEPERIDINE HCL 100 MG/ML IJ SOLN
75.0000 mg | Freq: Once | INTRAMUSCULAR | Status: AC
  Administered 2016-10-12: 75 mg via INTRAMUSCULAR

## 2016-10-12 MED ORDER — DIAZEPAM 5 MG PO TABS
10.0000 mg | ORAL_TABLET | Freq: Once | ORAL | Status: AC
  Administered 2016-10-12: 10 mg via ORAL

## 2016-10-12 MED ORDER — IOPAMIDOL (ISOVUE-M 200) INJECTION 41%
15.0000 mL | Freq: Once | INTRAMUSCULAR | Status: AC
  Administered 2016-10-12: 15 mL via INTRATHECAL

## 2016-10-12 MED ORDER — ONDANSETRON HCL 4 MG/2ML IJ SOLN: 4.0000 mg | Freq: Four times a day (QID) | INTRAMUSCULAR | Status: DC | PRN

## 2016-10-12 NOTE — Discharge Instructions (Signed)
Myelogram Discharge Instructions  1. Go home and rest quietly for the next 24 hours.  It is important to lie flat for the next 24 hours.  Get up only to go to the restroom.  You may lie in the bed or on a couch on your back, your stomach, your left side or your right side.  You may have one pillow under your head.  You may have pillows between your knees while you are on your side or under your knees while you are on your back.  2. DO NOT drive today.  Recline the seat as far back as it will go, while still wearing your seat belt, on the way home.  3. You may get up to go to the bathroom as needed.  You may sit up for 10 minutes to eat.  You may resume your normal diet and medications unless otherwise indicated.  Drink lots of extra fluids today and tomorrow.  4. The incidence of headache, nausea, or vomiting is about 5% (one in 20 patients).  If you develop a headache, lie flat and drink plenty of fluids until the headache goes away.  Caffeinated beverages may be helpful.  If you develop severe nausea and vomiting or a headache that does not go away with flat bed rest, call [phone].  5. You may resume normal activities after your 24 hours of bed rest is over; however, do not exert yourself strongly or do any heavy lifting tomorrow. If when you get up you have a headache when standing, go back to bed and force fluids for another 24 hours.  6. Call your physician for a follow-up appointment.  The results of your myelogram will be sent directly to your physician by the following day.  7. If you have any questions or if complications develop after you arrive home, please call [phone].  Discharge instructions have been explained to the patient.  The patient, or the person responsible for the patient, fully understands these instructions.       May resume Cymbalta on October 13, 2016, after 9:30 am.

## 2016-10-12 NOTE — Progress Notes (Signed)
Pt states he has been off Cymbalta since Tuesday.

## 2016-10-16 ENCOUNTER — Telehealth: Payer: Self-pay | Admitting: Radiology

## 2016-10-16 NOTE — Telephone Encounter (Signed)
Pt states he did well, was very sore after his bedrest.

## 2016-10-17 ENCOUNTER — Other Ambulatory Visit: Payer: Self-pay | Admitting: Neurosurgery

## 2016-10-19 ENCOUNTER — Ambulatory Visit (INDEPENDENT_AMBULATORY_CARE_PROVIDER_SITE_OTHER): Payer: Medicare HMO | Admitting: Orthopaedic Surgery

## 2016-10-19 ENCOUNTER — Telehealth (INDEPENDENT_AMBULATORY_CARE_PROVIDER_SITE_OTHER): Payer: Self-pay | Admitting: *Deleted

## 2016-10-19 ENCOUNTER — Ambulatory Visit (INDEPENDENT_AMBULATORY_CARE_PROVIDER_SITE_OTHER): Payer: Medicare HMO

## 2016-10-19 DIAGNOSIS — M25562 Pain in left knee: Secondary | ICD-10-CM | POA: Diagnosis not present

## 2016-10-19 DIAGNOSIS — Z96652 Presence of left artificial knee joint: Secondary | ICD-10-CM | POA: Diagnosis not present

## 2016-10-19 NOTE — Telephone Encounter (Signed)
ERROR

## 2016-10-19 NOTE — Progress Notes (Signed)
The patient is well-known to me. He has a history of an infected left total knee arthroplasty. This was of a strep infection. We to remove all the components in place antibiotic spacer and eventually revise the knee. He's been having some problems with his knee recently and mortise take a look at it. He has upcoming back surgery than this month due to recurrent disc problem.  On examination of his knee is patella seems to track low his quads are very weak. His extensor mechanism is intact. There is a moderate effusion. I drained 30 mL of fluid off his knee that was serosanguineous. This gave him immediate relief. The knee is not red and incisions well-healed.  I showed him quadrant strengthening exercises along try. He is, work on these quad strengthening exercises twice daily. I'll see him back in 3 months to see how is doing overall. Of note we did obtain x-rays of his knee today that showed no, getting features of the revision knee arthroplasty except for a low riding patella which may be the biggest issue of all

## 2016-10-24 ENCOUNTER — Encounter (HOSPITAL_COMMUNITY): Payer: Self-pay

## 2016-10-24 NOTE — Pre-Procedure Instructions (Addendum)
[name]  10/24/2016      [id number] - [place], [place] - [address]OSS [address] [place] [address] Phone: [phone] Fax: [phone]    Your procedure is scheduled on Monday March 19.  Report to [facility] Admitting at 9:30 A.M.  Call this number if you have problems the morning of surgery:  [phone]   Remember:  Do not eat food or drink liquids after midnight.  Take these medicines the morning of surgery with A SIP OF WATER: duloxetine (cymbalta), pantoprazole (protonix), tramsulosin (flomax),mirapex, oxycodone if needed  7 days prior to surgery STOP taking any Aspirin, Aleve, Naproxen, Ibuprofen, Motrin, Advil, Goody's, BC's, all herbal medications, fish oil, and all vitamins,voltaren(diclofenac)    Do not wear jewelry, make-up or nail polish.  Do not wear lotions, powders, or perfumes, or deoderant.  Do not shave 48 hours prior to surgery.  Men may shave face and neck.  Do not bring valuables to the hospital.  [facility] is not responsible for any belongings or valuables.  Contacts, dentures or bridgework may not be worn into surgery.  Leave your suitcase in the car.  After surgery it may be brought to your room.  For patients admitted to the hospital, discharge time will be determined by your treatment team.  Patients discharged the day of surgery will not be allowed to drive home.    Special instructions:    [facility]- Preparing For Surgery  Before surgery, you can play an important role. Because skin is not sterile, your skin needs to be as free of germs as possible. You can reduce the number of germs on your skin by washing with CHG (chlorahexidine gluconate) Soap before surgery.  CHG is an antiseptic cleaner which kills germs and bonds with the skin to continue killing germs even after washing.  Please do not use if you have an allergy to CHG or antibacterial soaps. If your  skin becomes reddened/irritated stop using the CHG.  Do not shave (including legs and underarms) for at least 48 hours prior to first CHG shower. It is OK to shave your face.  Please follow these instructions carefully.   1. Shower the NIGHT BEFORE SURGERY and the MORNING OF SURGERY with CHG.   2. If you chose to wash your hair, wash your hair first as usual with your normal shampoo.  3. After you shampoo, rinse your hair and body thoroughly to remove the shampoo.  4. Use CHG as you would any other liquid soap. You can apply CHG directly to the skin and wash gently with a scrungie or a clean washcloth.   5. Apply the CHG Soap to your body ONLY FROM THE NECK DOWN.  Do not use on open wounds or open sores. Avoid contact with your eyes, ears, mouth and genitals (private parts). Wash genitals (private parts) with your normal soap.  6. Wash thoroughly, paying special attention to the area where your surgery will be performed.  7. Thoroughly rinse your body with warm water from the neck down.  8. DO NOT shower/wash with your normal soap after using and rinsing off the CHG Soap.  9. Pat yourself dry with a CLEAN TOWEL.   10. Wear CLEAN PAJAMAS   11. Place CLEAN SHEETS on your bed the night of your first shower and DO NOT SLEEP WITH PETS.    Day of Surgery: Do not apply any deodorants/lotions. Please wear  clean clothes to the hospital/surgery center.      Please read over the fact sheets that you were given.

## 2016-10-25 ENCOUNTER — Encounter (HOSPITAL_COMMUNITY): Payer: Self-pay

## 2016-10-25 ENCOUNTER — Encounter (HOSPITAL_COMMUNITY)
Admission: RE | Admit: 2016-10-25 | Discharge: 2016-10-25 | Disposition: A | Discharge status: Home / Self Care | Payer: Medicare HMO | Source: Ambulatory Visit | Attending: Neurosurgery | Admitting: Neurosurgery

## 2016-10-25 DIAGNOSIS — M5126 Other intervertebral disc displacement, lumbar region: Secondary | ICD-10-CM | POA: Insufficient documentation

## 2016-10-25 DIAGNOSIS — Z01812 Encounter for preprocedural laboratory examination: Secondary | ICD-10-CM | POA: Insufficient documentation

## 2016-10-25 HISTORY — DX: Essential (primary) hypertension: I10

## 2016-10-25 HISTORY — DX: Unspecified osteoarthritis, unspecified site: M19.90

## 2016-10-25 LAB — BASIC METABOLIC PANEL
Anion gap: 6 (ref 5–15)
BUN: 23 mg/dL — ABNORMAL HIGH (ref 6–20)
CO2: 28 mmol/L (ref 22–32)
CREATININE: 0.99 mg/dL (ref 0.61–1.24)
Calcium: 9 mg/dL (ref 8.9–10.3)
Chloride: 102 mmol/L (ref 101–111)
GFR calc non Af Amer: >60 mL/min (ref >60)
GLUCOSE: 108 mg/dL — AB (ref 65–99)
Potassium: 3.9 mmol/L (ref 3.5–5.1)
Sodium: 136 mmol/L (ref 135–145)

## 2016-10-25 LAB — CBC WITH DIFFERENTIAL/PLATELET
Basophils Absolute: 0 10*3/uL (ref 0.0–0.1)
Basophils Relative: 0 %
Eosinophils Absolute: 0.1 10*3/uL (ref 0.0–0.7)
Eosinophils Relative: 2 %
HEMATOCRIT: 38 % — AB (ref 39.0–52.0)
Hemoglobin: 12.9 g/dL — ABNORMAL LOW (ref 13.0–17.0)
LYMPHS ABS: 1.5 10*3/uL (ref 0.7–4.0)
LYMPHS PCT: 26 %
MCH: 29.9 pg (ref 26.0–34.0)
MCHC: 33.9 g/dL (ref 30.0–36.0)
MCV: 88 fL (ref 78.0–100.0)
MONO ABS: 0.4 10*3/uL (ref 0.1–1.0)
Monocytes Relative: 6 %
NEUTROS ABS: 3.8 10*3/uL (ref 1.7–7.7)
Neutrophils Relative %: 66 %
Platelets: 198 10*3/uL (ref 150–400)
RBC: 4.32 MIL/uL (ref 4.22–5.81)
RDW: 12.5 % (ref 11.5–15.5)
WBC: 5.7 10*3/uL (ref 4.0–10.5)

## 2016-10-25 LAB — TYPE AND SCREEN
ABO/RH(D): A POS
Antibody Screen: NEGATIVE

## 2016-10-25 LAB — SURGICAL PCR SCREEN
MRSA, PCR: NEGATIVE
STAPHYLOCOCCUS AUREUS: NEGATIVE

## 2016-10-26 NOTE — Progress Notes (Signed)
Anesthesia Chart Review:  Pt is a 61 year old male scheduled for L1-2 PLIF on 10/30/2016 with Julio Sicks, MD.   - PCP is Tobie Lords, PA (notes in care everywhere)  - Pt saw Gerda Diss, MD with cardiology 05/2016 for chest pain, stress test ordered was normal, results below. Pt does not usually f/u with cardiology.   PMH includes:  HTN, ADD, RA, OCD, GERD. Never smoker. BMI 28. S/p L TKA 01/08/15 with excision 08/06/15, revision L TKA 09/20/15.   Medications include: lipitor, lisinopril-HCTZ, methotrexate, methylphenidate, Protonix.  Preoperative labs reviewed.    EKG 06/02/16 ([facility]): Sinus rhythm. Poor R-wave progression.  Nuclear stress test 08/31/16: Normal left ventricular wall motion. Left ventricular ejection fraction of 69%. Normal cardiac perfusion exam. Prognostically this is a low risk scan.   If no changes, I anticipate pt can proceed with surgery as scheduled.   Rica Mast, FNP-BC [facility]/Anesthesiology Phone: [phone] 10/26/2016 4:10 PM

## 2016-10-30 ENCOUNTER — Encounter (HOSPITAL_COMMUNITY)
Admission: RE | Disposition: A | Discharge status: Home / Self Care | Payer: Self-pay | Source: Ambulatory Visit | Attending: Neurosurgery

## 2016-10-30 ENCOUNTER — Encounter (HOSPITAL_COMMUNITY): Payer: Self-pay | Admitting: Urology

## 2016-10-30 ENCOUNTER — Inpatient Hospital Stay (HOSPITAL_COMMUNITY): Payer: Medicare HMO

## 2016-10-30 ENCOUNTER — Inpatient Hospital Stay (HOSPITAL_COMMUNITY)
Admission: RE | Admit: 2016-10-30 | Discharge: 2016-10-31 | DRG: 454 | Disposition: A | Discharge status: Home / Self Care | Payer: Medicare HMO | Source: Ambulatory Visit | Attending: Neurosurgery | Admitting: Neurosurgery

## 2016-10-30 ENCOUNTER — Inpatient Hospital Stay (HOSPITAL_COMMUNITY): Payer: Medicare HMO | Admitting: Certified Registered Nurse Anesthetist

## 2016-10-30 ENCOUNTER — Inpatient Hospital Stay (HOSPITAL_COMMUNITY): Payer: Medicare HMO | Admitting: Emergency Medicine

## 2016-10-30 DIAGNOSIS — M5126 Other intervertebral disc displacement, lumbar region: Secondary | ICD-10-CM | POA: Diagnosis present

## 2016-10-30 DIAGNOSIS — I10 Essential (primary) hypertension: Secondary | ICD-10-CM | POA: Diagnosis present

## 2016-10-30 DIAGNOSIS — M960 Pseudarthrosis after fusion or arthrodesis: Secondary | ICD-10-CM | POA: Diagnosis present

## 2016-10-30 DIAGNOSIS — Z79899 Other long term (current) drug therapy: Secondary | ICD-10-CM | POA: Diagnosis not present

## 2016-10-30 DIAGNOSIS — Z981 Arthrodesis status: Secondary | ICD-10-CM | POA: Diagnosis not present

## 2016-10-30 DIAGNOSIS — H269 Unspecified cataract: Secondary | ICD-10-CM | POA: Diagnosis present

## 2016-10-30 DIAGNOSIS — Z96641 Presence of right artificial hip joint: Secondary | ICD-10-CM | POA: Diagnosis present

## 2016-10-30 DIAGNOSIS — M48062 Spinal stenosis, lumbar region with neurogenic claudication: Secondary | ICD-10-CM | POA: Diagnosis present

## 2016-10-30 DIAGNOSIS — K219 Gastro-esophageal reflux disease without esophagitis: Secondary | ICD-10-CM | POA: Diagnosis present

## 2016-10-30 DIAGNOSIS — Z87442 Personal history of urinary calculi: Secondary | ICD-10-CM

## 2016-10-30 DIAGNOSIS — Z791 Long term (current) use of non-steroidal anti-inflammatories (NSAID): Secondary | ICD-10-CM

## 2016-10-30 DIAGNOSIS — F329 Major depressive disorder, single episode, unspecified: Secondary | ICD-10-CM | POA: Diagnosis present

## 2016-10-30 DIAGNOSIS — N400 Enlarged prostate without lower urinary tract symptoms: Secondary | ICD-10-CM | POA: Diagnosis present

## 2016-10-30 DIAGNOSIS — M069 Rheumatoid arthritis, unspecified: Secondary | ICD-10-CM | POA: Diagnosis present

## 2016-10-30 DIAGNOSIS — R200 Anesthesia of skin: Secondary | ICD-10-CM | POA: Diagnosis present

## 2016-10-30 DIAGNOSIS — F429 Obsessive-compulsive disorder, unspecified: Secondary | ICD-10-CM | POA: Diagnosis present

## 2016-10-30 DIAGNOSIS — Z79891 Long term (current) use of opiate analgesic: Secondary | ICD-10-CM

## 2016-10-30 DIAGNOSIS — G2581 Restless legs syndrome: Secondary | ICD-10-CM | POA: Diagnosis present

## 2016-10-30 DIAGNOSIS — Z880 Allergy status to penicillin: Secondary | ICD-10-CM

## 2016-10-30 DIAGNOSIS — Z96652 Presence of left artificial knee joint: Secondary | ICD-10-CM | POA: Diagnosis present

## 2016-10-30 DIAGNOSIS — Z820 Family history of epilepsy and other diseases of the nervous system: Secondary | ICD-10-CM | POA: Diagnosis not present

## 2016-10-30 DIAGNOSIS — Z419 Encounter for procedure for purposes other than remedying health state, unspecified: Secondary | ICD-10-CM

## 2016-10-30 DIAGNOSIS — F988 Other specified behavioral and emotional disorders with onset usually occurring in childhood and adolescence: Secondary | ICD-10-CM | POA: Diagnosis present

## 2016-10-30 DIAGNOSIS — Z8249 Family history of ischemic heart disease and other diseases of the circulatory system: Secondary | ICD-10-CM

## 2016-10-30 DIAGNOSIS — Z973 Presence of spectacles and contact lenses: Secondary | ICD-10-CM

## 2016-10-30 SURGERY — POSTERIOR LUMBAR FUSION 1 LEVEL
Anesthesia: General | Site: Spine Lumbar

## 2016-10-30 MED ORDER — ONDANSETRON HCL 4 MG/2ML IJ SOLN: 4.0000 mg | Freq: Four times a day (QID) | INTRAMUSCULAR | Status: DC | PRN

## 2016-10-30 MED ORDER — PHENOL 1.4 % MT LIQD: 1.0000 | OROMUCOSAL | Status: DC | PRN

## 2016-10-30 MED ORDER — ARTIFICIAL TEARS OP OINT
TOPICAL_OINTMENT | OPHTHALMIC | Status: DC | PRN
  Administered 2016-10-30: 1 via OPHTHALMIC

## 2016-10-30 MED ORDER — BUPIVACAINE HCL (PF) 0.5 % IJ SOLN
INTRAMUSCULAR | Status: AC
  Filled 2016-10-30: qty 30

## 2016-10-30 MED ORDER — EPHEDRINE SULFATE 50 MG/ML IJ SOLN
INTRAMUSCULAR | Status: DC | PRN
  Administered 2016-10-30 (×5): 10 mg via INTRAVENOUS

## 2016-10-30 MED ORDER — HYDROMORPHONE HCL 1 MG/ML IJ SOLN
0.5000 mg | INTRAMUSCULAR | Status: DC | PRN
  Administered 2016-10-30: 1 mg via INTRAVENOUS
  Filled 2016-10-30: qty 1

## 2016-10-30 MED ORDER — HYDROMORPHONE HCL 1 MG/ML IJ SOLN
INTRAMUSCULAR | Status: AC
  Filled 2016-10-30: qty 0.5

## 2016-10-30 MED ORDER — LACTATED RINGERS IV SOLN
INTRAVENOUS | Status: DC
  Administered 2016-10-30 (×3): via INTRAVENOUS

## 2016-10-30 MED ORDER — OXYCODONE HCL 5 MG PO TABS
5.0000 mg | ORAL_TABLET | Freq: Once | ORAL | Status: AC | PRN
  Administered 2016-10-30: 5 mg via ORAL

## 2016-10-30 MED ORDER — 0.9 % SODIUM CHLORIDE (POUR BTL) OPTIME
TOPICAL | Status: DC | PRN
  Administered 2016-10-30: 1000 mL

## 2016-10-30 MED ORDER — ONDANSETRON HCL 4 MG/2ML IJ SOLN
INTRAMUSCULAR | Status: DC | PRN
  Administered 2016-10-30: 4 mg via INTRAVENOUS

## 2016-10-30 MED ORDER — SUGAMMADEX SODIUM 200 MG/2ML IV SOLN
INTRAVENOUS | Status: DC | PRN
  Administered 2016-10-30: 200 mg via INTRAVENOUS

## 2016-10-30 MED ORDER — ONDANSETRON HCL 4 MG PO TABS: 4.0000 mg | ORAL_TABLET | Freq: Four times a day (QID) | ORAL | Status: DC | PRN

## 2016-10-30 MED ORDER — SODIUM CHLORIDE 0.9% FLUSH: 3.0000 mL | INTRAVENOUS | Status: DC | PRN

## 2016-10-30 MED ORDER — VANCOMYCIN HCL IN DEXTROSE 1-5 GM/200ML-% IV SOLN
INTRAVENOUS | Status: AC
Start: 2016-10-30 — End: 2016-10-30
  Filled 2016-10-30: qty 200

## 2016-10-30 MED ORDER — FENTANYL CITRATE (PF) 100 MCG/2ML IJ SOLN
INTRAMUSCULAR | Status: AC
  Filled 2016-10-30: qty 4

## 2016-10-30 MED ORDER — LISINOPRIL 20 MG PO TABS
10.0000 mg | ORAL_TABLET | Freq: Every day | ORAL | Status: DC
  Administered 2016-10-31: 10 mg via ORAL
  Filled 2016-10-30: qty 1

## 2016-10-30 MED ORDER — BUPIVACAINE HCL (PF) 0.25 % IJ SOLN
INTRAMUSCULAR | Status: DC | PRN
  Administered 2016-10-30: 30 mL

## 2016-10-30 MED ORDER — DULOXETINE HCL 30 MG PO CPEP
60.0000 mg | ORAL_CAPSULE | Freq: Two times a day (BID) | ORAL | Status: DC
  Administered 2016-10-30 – 2016-10-31 (×2): 60 mg via ORAL
  Filled 2016-10-30 (×2): qty 2

## 2016-10-30 MED ORDER — HYDROCODONE-ACETAMINOPHEN 10-325 MG PO TABS: 1.0000 | ORAL_TABLET | ORAL | Status: DC | PRN

## 2016-10-30 MED ORDER — ONDANSETRON HCL 4 MG PO TABS: 4.0000 mg | ORAL_TABLET | Freq: Three times a day (TID) | ORAL | Status: DC | PRN

## 2016-10-30 MED ORDER — DIAZEPAM 5 MG PO TABS
5.0000 mg | ORAL_TABLET | Freq: Four times a day (QID) | ORAL | Status: DC | PRN
  Administered 2016-10-30 – 2016-10-31 (×2): 5 mg via ORAL
  Filled 2016-10-30 (×2): qty 1

## 2016-10-30 MED ORDER — DEXAMETHASONE SODIUM PHOSPHATE 10 MG/ML IJ SOLN
10.0000 mg | INTRAMUSCULAR | Status: AC
  Administered 2016-10-30: 10 mg via INTRAVENOUS

## 2016-10-30 MED ORDER — ATORVASTATIN CALCIUM 20 MG PO TABS
40.0000 mg | ORAL_TABLET | Freq: Every day | ORAL | Status: DC
  Administered 2016-10-30: 40 mg via ORAL
  Filled 2016-10-30: qty 2

## 2016-10-30 MED ORDER — LISINOPRIL-HYDROCHLOROTHIAZIDE 10-12.5 MG PO TABS: 1.0000 | ORAL_TABLET | Freq: Every day | ORAL | Status: DC

## 2016-10-30 MED ORDER — VANCOMYCIN HCL 1000 MG IV SOLR
INTRAVENOUS | Status: DC | PRN
  Administered 2016-10-30: 1000 mg via TOPICAL

## 2016-10-30 MED ORDER — SODIUM CHLORIDE 0.9% FLUSH
3.0000 mL | Freq: Two times a day (BID) | INTRAVENOUS | Status: DC
  Administered 2016-10-31: 3 mL via INTRAVENOUS

## 2016-10-30 MED ORDER — OXYCODONE HCL 5 MG PO TABS
ORAL_TABLET | ORAL | Status: AC
  Filled 2016-10-30: qty 1

## 2016-10-30 MED ORDER — VANCOMYCIN HCL IN DEXTROSE 1-5 GM/200ML-% IV SOLN
1000.0000 mg | INTRAVENOUS | Status: AC
  Administered 2016-10-30: 1000 mg via INTRAVENOUS

## 2016-10-30 MED ORDER — BUPIVACAINE HCL (PF) 0.25 % IJ SOLN
INTRAMUSCULAR | Status: AC
  Filled 2016-10-30: qty 30

## 2016-10-30 MED ORDER — THROMBIN 5000 UNITS EX SOLR
CUTANEOUS | Status: AC
  Filled 2016-10-30: qty 5000

## 2016-10-30 MED ORDER — CHLORHEXIDINE GLUCONATE CLOTH 2 % EX PADS: 6.0000 | MEDICATED_PAD | Freq: Once | CUTANEOUS | Status: DC

## 2016-10-30 MED ORDER — IBUPROFEN 200 MG PO TABS: 800.0000 mg | ORAL_TABLET | Freq: Four times a day (QID) | ORAL | Status: DC | PRN

## 2016-10-30 MED ORDER — ROCURONIUM BROMIDE 100 MG/10ML IV SOLN
INTRAVENOUS | Status: DC | PRN
Start: 2016-10-30 — End: 2016-10-30
  Administered 2016-10-30 (×2): 50 mg via INTRAVENOUS

## 2016-10-30 MED ORDER — THROMBIN 20000 UNITS EX SOLR
CUTANEOUS | Status: AC
  Filled 2016-10-30: qty 20000

## 2016-10-30 MED ORDER — VANCOMYCIN HCL 1000 MG IV SOLR
INTRAVENOUS | Status: AC
  Filled 2016-10-30: qty 1000

## 2016-10-30 MED ORDER — FOLIC ACID 1 MG PO TABS
1.0000 mg | ORAL_TABLET | Freq: Every day | ORAL | Status: DC
  Administered 2016-10-31: 1 mg via ORAL
  Filled 2016-10-30: qty 1

## 2016-10-30 MED ORDER — ALBUMIN HUMAN 5 % IV SOLN
INTRAVENOUS | Status: DC | PRN
  Administered 2016-10-30 (×2): via INTRAVENOUS

## 2016-10-30 MED ORDER — HYDROCHLOROTHIAZIDE 12.5 MG PO CAPS
12.5000 mg | ORAL_CAPSULE | Freq: Every day | ORAL | Status: DC
  Administered 2016-10-31: 12.5 mg via ORAL
  Filled 2016-10-30: qty 1

## 2016-10-30 MED ORDER — DICLOFENAC SODIUM 75 MG PO TBEC
75.0000 mg | DELAYED_RELEASE_TABLET | Freq: Two times a day (BID) | ORAL | Status: DC
  Administered 2016-10-31: 75 mg via ORAL
  Filled 2016-10-30: qty 1

## 2016-10-30 MED ORDER — HYDROMORPHONE HCL 1 MG/ML IJ SOLN
0.2500 mg | INTRAMUSCULAR | Status: DC | PRN
  Administered 2016-10-30 (×4): 0.5 mg via INTRAVENOUS

## 2016-10-30 MED ORDER — TAMSULOSIN HCL 0.4 MG PO CAPS
0.4000 mg | ORAL_CAPSULE | Freq: Every day | ORAL | Status: DC
  Administered 2016-10-30 – 2016-10-31 (×2): 0.4 mg via ORAL
  Filled 2016-10-30 (×2): qty 1

## 2016-10-30 MED ORDER — METHYLPHENIDATE HCL 10 MG PO TABS: 10.0000 mg | ORAL_TABLET | Freq: Every day | ORAL | Status: DC

## 2016-10-30 MED ORDER — OXYCODONE HCL 5 MG/5ML PO SOLN: 5.0000 mg | Freq: Once | ORAL | Status: AC | PRN

## 2016-10-30 MED ORDER — THROMBIN 20000 UNITS EX SOLR
CUTANEOUS | Status: DC | PRN
  Administered 2016-10-30: 14:00:00 via TOPICAL

## 2016-10-30 MED ORDER — EPHEDRINE 5 MG/ML INJ
INTRAVENOUS | Status: AC
  Filled 2016-10-30: qty 10

## 2016-10-30 MED ORDER — MIDAZOLAM HCL 2 MG/2ML IJ SOLN
INTRAMUSCULAR | Status: AC
  Filled 2016-10-30: qty 2

## 2016-10-30 MED ORDER — SODIUM CHLORIDE 0.9 % IR SOLN
Status: DC | PRN
  Administered 2016-10-30: 14:00:00

## 2016-10-30 MED ORDER — VANCOMYCIN HCL IN DEXTROSE 1-5 GM/200ML-% IV SOLN
1000.0000 mg | Freq: Two times a day (BID) | INTRAVENOUS | Status: DC
  Administered 2016-10-31: 1000 mg via INTRAVENOUS

## 2016-10-30 MED ORDER — PHENYLEPHRINE HCL 10 MG/ML IJ SOLN
INTRAVENOUS | Status: DC | PRN
  Administered 2016-10-30: 50 ug/min via INTRAVENOUS

## 2016-10-30 MED ORDER — OXYCODONE HCL 5 MG PO TABS: 5.0000 mg | ORAL_TABLET | Freq: Four times a day (QID) | ORAL | Status: DC | PRN

## 2016-10-30 MED ORDER — PHENYLEPHRINE 40 MCG/ML (10ML) SYRINGE FOR IV PUSH (FOR BLOOD PRESSURE SUPPORT)
PREFILLED_SYRINGE | INTRAVENOUS | Status: DC | PRN
  Administered 2016-10-30 (×8): 80 ug via INTRAVENOUS

## 2016-10-30 MED ORDER — OXYCODONE-ACETAMINOPHEN 5-325 MG PO TABS
1.0000 | ORAL_TABLET | ORAL | Status: DC | PRN
  Administered 2016-10-30 – 2016-10-31 (×5): 2 via ORAL
  Filled 2016-10-30 (×5): qty 2

## 2016-10-30 MED ORDER — HYDROMORPHONE HCL 1 MG/ML IJ SOLN
INTRAMUSCULAR | Status: AC
  Filled 2016-10-30: qty 1

## 2016-10-30 MED ORDER — PANTOPRAZOLE SODIUM 20 MG PO TBEC
20.0000 mg | DELAYED_RELEASE_TABLET | Freq: Every day | ORAL | Status: DC
  Administered 2016-10-31: 20 mg via ORAL
  Filled 2016-10-30: qty 1

## 2016-10-30 MED ORDER — PROPOFOL 10 MG/ML IV BOLUS
INTRAVENOUS | Status: AC
  Filled 2016-10-30: qty 20

## 2016-10-30 MED ORDER — MIDAZOLAM HCL 5 MG/5ML IJ SOLN
INTRAMUSCULAR | Status: DC | PRN
  Administered 2016-10-30: 2 mg via INTRAVENOUS

## 2016-10-30 MED ORDER — PRAMIPEXOLE DIHYDROCHLORIDE 1 MG PO TABS
1.0000 mg | ORAL_TABLET | Freq: Three times a day (TID) | ORAL | Status: DC
  Administered 2016-10-30 – 2016-10-31 (×2): 1 mg via ORAL
  Filled 2016-10-30 (×4): qty 1

## 2016-10-30 MED ORDER — MENTHOL 3 MG MT LOZG: 1.0000 | LOZENGE | OROMUCOSAL | Status: DC | PRN

## 2016-10-30 MED ORDER — FENTANYL CITRATE (PF) 100 MCG/2ML IJ SOLN
INTRAMUSCULAR | Status: DC | PRN
  Administered 2016-10-30 (×2): 50 ug via INTRAVENOUS
  Administered 2016-10-30: 100 ug via INTRAVENOUS

## 2016-10-30 MED ORDER — PROPOFOL 10 MG/ML IV BOLUS
INTRAVENOUS | Status: DC | PRN
  Administered 2016-10-30: 150 mg via INTRAVENOUS

## 2016-10-30 MED ORDER — PHENYLEPHRINE 40 MCG/ML (10ML) SYRINGE FOR IV PUSH (FOR BLOOD PRESSURE SUPPORT)
PREFILLED_SYRINGE | INTRAVENOUS | Status: AC
  Filled 2016-10-30: qty 20

## 2016-10-30 MED ORDER — LIDOCAINE HCL (CARDIAC) 20 MG/ML IV SOLN
INTRAVENOUS | Status: DC | PRN
  Administered 2016-10-30: 80 mg via INTRAVENOUS

## 2016-10-30 SURGICAL SUPPLY — 74 items
ADH SKN CLS APL DERMABOND .7 (GAUZE/BANDAGES/DRESSINGS) ×1
APL SKNCLS STERI-STRIP NONHPOA (GAUZE/BANDAGES/DRESSINGS) ×1
BAG DECANTER FOR FLEXI CONT (MISCELLANEOUS) ×3 IMPLANT
BENZOIN TINCTURE PRP APPL 2/3 (GAUZE/BANDAGES/DRESSINGS) ×3 IMPLANT
BLADE CLIPPER SURG (BLADE) ×3 IMPLANT
BUR CUTTER 7.0 ROUND (BURR) ×3 IMPLANT
BUR MATCHSTICK NEURO 3.0 LAGG (BURR) ×3 IMPLANT
CANISTER SUCT 3000ML PPV (MISCELLANEOUS) ×3 IMPLANT
CAP LCK SPNE (Orthopedic Implant) ×8 IMPLANT
CAP LOCK SPINE RADIUS (Orthopedic Implant) IMPLANT
CAP LOCKING (Orthopedic Implant) ×24 IMPLANT
CARTRIDGE OIL MAESTRO DRILL (MISCELLANEOUS) ×1 IMPLANT
CLOSURE WOUND 1/2 X4 (GAUZE/BANDAGES/DRESSINGS) ×1
CONT SPEC 4OZ CLIKSEAL STRL BL (MISCELLANEOUS) ×5 IMPLANT
CORDS BIPOLAR (ELECTRODE) ×2 IMPLANT
COVER BACK TABLE 60X90IN (DRAPES) ×3 IMPLANT
DERMABOND ADVANCED (GAUZE/BANDAGES/DRESSINGS) ×2
DERMABOND ADVANCED .7 DNX12 (GAUZE/BANDAGES/DRESSINGS) ×1 IMPLANT
DEVICE INTERBODY ELEVATE 23X8 (Cage) ×6 IMPLANT
DIFFUSER DRILL AIR PNEUMATIC (MISCELLANEOUS) ×3 IMPLANT
DRAPE C-ARM 42X72 X-RAY (DRAPES) ×6 IMPLANT
DRAPE HALF SHEET 40X57 (DRAPES) ×3 IMPLANT
DRAPE LAPAROTOMY 100X72X124 (DRAPES) ×3 IMPLANT
DRAPE POUCH INSTRU U-SHP 10X18 (DRAPES) ×3 IMPLANT
DRAPE SURG 17X23 STRL (DRAPES) ×12 IMPLANT
DRSG OPSITE POSTOP 4X8 (GAUZE/BANDAGES/DRESSINGS) ×2 IMPLANT
DURAPREP 26ML APPLICATOR (WOUND CARE) ×3 IMPLANT
ELECT REM PT RETURN 9FT ADLT (ELECTROSURGICAL) ×3
ELECTRODE REM PT RTRN 9FT ADLT (ELECTROSURGICAL) ×1 IMPLANT
EVACUATOR 1/8 PVC DRAIN (DRAIN) ×1 IMPLANT
GAUZE SPONGE 4X4 12PLY STRL (GAUZE/BANDAGES/DRESSINGS) ×3 IMPLANT
GLOVE BIO SURGEON STRL SZ 6.5 (GLOVE) ×2 IMPLANT
GLOVE BIO SURGEONS STRL SZ 6.5 (GLOVE) ×2
GLOVE BIOGEL PI IND STRL 6.5 (GLOVE) IMPLANT
GLOVE BIOGEL PI INDICATOR 6.5 (GLOVE) ×6
GLOVE ECLIPSE 9.0 STRL (GLOVE) ×6 IMPLANT
GLOVE INDICATOR 7.5 STRL GRN (GLOVE) ×8 IMPLANT
GOWN STRL REUS W/ TWL LRG LVL3 (GOWN DISPOSABLE) IMPLANT
GOWN STRL REUS W/ TWL XL LVL3 (GOWN DISPOSABLE) ×2 IMPLANT
GOWN STRL REUS W/TWL 2XL LVL3 (GOWN DISPOSABLE) ×2 IMPLANT
GOWN STRL REUS W/TWL LRG LVL3 (GOWN DISPOSABLE) ×6
GOWN STRL REUS W/TWL XL LVL3 (GOWN DISPOSABLE) ×6
GRAFT BN 10X1XDBM MAGNIFUSE (Bone Implant) IMPLANT
GRAFT BN 5X1XSPNE CVD POST DBM (Bone Implant) IMPLANT
GRAFT BONE MAGNIFUSE 1X10CM (Bone Implant) ×3 IMPLANT
GRAFT BONE MAGNIFUSE 1X5CM (Bone Implant) ×3 IMPLANT
KIT BASIN OR (CUSTOM PROCEDURE TRAY) ×3 IMPLANT
KIT INFUSE SMALL (Orthopedic Implant) ×2 IMPLANT
KIT ROOM TURNOVER OR (KITS) ×3 IMPLANT
NEEDLE HYPO 22GX1.5 SAFETY (NEEDLE) ×3 IMPLANT
NS IRRIG 1000ML POUR BTL (IV SOLUTION) ×3 IMPLANT
OIL CARTRIDGE MAESTRO DRILL (MISCELLANEOUS) ×3
PACK LAMINECTOMY NEURO (CUSTOM PROCEDURE TRAY) ×3 IMPLANT
PENCIL BUTTON BLDE SNGL 10FT (ELECTRODE) ×3 IMPLANT
ROD 110MM (Rod) ×6 IMPLANT
ROD SPNL 110X5.5XNS TI RDS (Rod) IMPLANT
SCREW 6.75X40MM (Screw) ×4 IMPLANT
SCREW 6.75X45MM (Screw) ×12 IMPLANT
SPACER SPNL STD 23X8XSTRL (Cage) IMPLANT
SPCR SPNL STD 23X8XSTRL (Cage) ×2 IMPLANT
SPONGE SURGIFOAM ABS GEL 100 (HEMOSTASIS) ×3 IMPLANT
STRIP CLOSURE SKIN 1/2X4 (GAUZE/BANDAGES/DRESSINGS) ×3 IMPLANT
SUT VIC AB 0 CT1 18XCR BRD8 (SUTURE) ×2 IMPLANT
SUT VIC AB 0 CT1 8-18 (SUTURE) ×3
SUT VIC AB 2-0 CT1 18 (SUTURE) ×3 IMPLANT
SUT VIC AB 3-0 SH 8-18 (SUTURE) ×6 IMPLANT
SYR 20ML ECCENTRIC (SYRINGE) ×2 IMPLANT
SYR CONTROL 10ML LL (SYRINGE) ×2 IMPLANT
TOWEL GREEN STERILE (TOWEL DISPOSABLE) ×3 IMPLANT
TOWEL GREEN STERILE FF (TOWEL DISPOSABLE) ×2 IMPLANT
TRAY FOLEY W/METER SILVER 16FR (SET/KITS/TRAYS/PACK) ×3 IMPLANT
TUBE CONNECTING 12'X1/4 (SUCTIONS) ×1
TUBE CONNECTING 12X1/4 (SUCTIONS) ×1 IMPLANT
WATER STERILE IRR 1000ML POUR (IV SOLUTION) ×3 IMPLANT

## 2016-10-30 NOTE — Anesthesia Preprocedure Evaluation (Signed)
Anesthesia Evaluation  Patient identified by MRN, date of birth, ID band Patient awake    Reviewed: Allergy & Precautions, NPO status , Patient's Chart, lab work & pertinent test results  Airway Mallampati: II       Dental   Pulmonary neg pulmonary ROS,    breath sounds clear to auscultation       Cardiovascular hypertension,  Rhythm:Regular Rate:Normal     Neuro/Psych  Headaches,    GI/Hepatic Neg liver ROS, GERD  ,  Endo/Other  negative endocrine ROS  Renal/GU      Musculoskeletal  (+) Arthritis ,   Abdominal   Peds  Hematology  (+) anemia ,   Anesthesia Other Findings   Reproductive/Obstetrics                             Anesthesia Physical Anesthesia Plan  ASA: III  Anesthesia Plan: General   Post-op Pain Management:    Induction: Intravenous  Airway Management Planned: Oral ETT  Additional Equipment:   Intra-op Plan:   Post-operative Plan: Extubation in OR  Informed Consent: I have reviewed the patients History and Physical, chart, labs and discussed the procedure including the risks, benefits and alternatives for the proposed anesthesia with the patient or authorized representative who has indicated his/her understanding and acceptance.   Dental advisory given  Plan Discussed with: CRNA and Anesthesiologist  Anesthesia Plan Comments:         Anesthesia Quick Evaluation

## 2016-10-30 NOTE — Anesthesia Procedure Notes (Signed)
Procedure Name: Intubation Date/Time: 10/30/2016 12:00 PM Performed by: Clearnce Sorrel Pre-anesthesia Checklist: Patient identified, Emergency Drugs available, Suction available, Patient being monitored and Timeout performed Patient Re-evaluated:Patient Re-evaluated prior to inductionOxygen Delivery Method: Circle System Utilized and Circle system utilized Preoxygenation: Pre-oxygenation with 100% oxygen Intubation Type: IV induction Ventilation: Mask ventilation without difficulty Laryngoscope Size: Mac and 4 Grade View: Grade I Tube type: Oral Tube size: 7.5 mm Number of attempts: 1 Airway Equipment and Method: Stylet and Oral airway Placement Confirmation: ETT inserted through vocal cords under direct vision,  positive ETCO2 and breath sounds checked- equal and bilateral Secured at: 23 cm Tube secured with: Tape Dental Injury: Teeth and Oropharynx as per pre-operative assessment

## 2016-10-30 NOTE — Brief Op Note (Signed)
10/30/2016  3:27 PM  PATIENT:  Ethelene Browns Robbs  61 y.o. male  PRE-OPERATIVE DIAGNOSIS:  HNP  POST-OPERATIVE DIAGNOSIS:  HNP Lumbar 1-2, Pseudoarthrosis and Hardware Failure Lumbar Three-Four  PROCEDURE:  Procedure(s): LUMBAR ONE-TWO POSTERIOR LUMBAR FUSION WITH REVISION OF LUMBAR ONE-FOUR POSTERIOR LATERAL FUSION (N/A)  SURGEON:  Surgeon(s) and Role:    * Julio Sicks, MD - Primary  PHYSICIAN ASSISTANT:   ASSISTANTS: [place]   ANESTHESIA:   general  EBL:  Total I/O In: 1750 [I.V.:1000; IV Piggyback:750] Out: 460 [Urine:110; Blood:350]  BLOOD ADMINISTERED:none  DRAINS: none   LOCAL MEDICATIONS USED:  MARCAINE     SPECIMEN:  No Specimen  DISPOSITION OF SPECIMEN:  N/A  COUNTS:  YES  TOURNIQUET:  * No tourniquets in log *  DICTATION: .Dragon Dictation  PLAN OF CARE: Admit to inpatient   PATIENT DISPOSITION:  PACU - hemodynamically stable.   Delay start of Pharmacological VTE agent (>24hrs) due to surgical blood loss or risk of bleeding: yes

## 2016-10-30 NOTE — H&P (Signed)
[name] is an 61 y.o. male.   Chief Complaint: Back pain HPI: 61 year old male status post previous L2-3, L3-4 and L4-5 fusions presents with worsening back pain with bilateral lower extremity weakness and progressive sensory loss. Workup demonstrates evidence of a very large adjacent level disc herniation at L1-2 with severe stenosis. Patient presents now for decompression and fusion at L1-2.  Past Medical History:  Diagnosis Date  . ADD (attention deficit disorder)    takes Ritalin daily  . Anemia    denies  . Anxiety   . Arthritis   . Cataract    left eye  . Depression    takes Cymbalta daily  . Enlarged prostate    takes Flomax daily   . GERD (gastroesophageal reflux disease)    takes Omeprazole daily  . Headache    hx of migraines-none recent  . History of blood transfusion   . History of kidney stones    x1 15 years ago  . Hypertension   . Numbness and tingling    fingers bilat comes and goes   . OCD (obsessive compulsive disorder)   . Restless leg syndrome    takes requip daily  . Rheumatoid arthritis(714.0)    sees Dr Sharmon Revere @ [facility] in [place]  . Wears glasses     Past Surgical History:  Procedure Laterality Date  . ABDOMINAL EXPOSURE  09/13/2012   Procedure: ABDOMINAL EXPOSURE;  Surgeon: Larina Earthly, MD;  Location: [facility];  Service: Vascular;  Laterality: N/A;  . ANTERIOR LAT LUMBAR FUSION  09/13/2012   Procedure: ANTERIOR LATERAL LUMBAR FUSION 2 LEVELS;  Surgeon: Reinaldo Meeker, MD;  Location: [facility];  Service: Neurosurgery;  Laterality: Right;  lumbar two three,three-four  . ANTERIOR LUMBAR FUSION  09/13/2012   Procedure: ANTERIOR LUMBAR FUSION 1 LEVEL;  Surgeon: Reinaldo Meeker, MD;  Location: [facility];  Service: Neurosurgery;  Laterality: N/A;  lumbar four-five  . EXCISIONAL TOTAL KNEE ARTHROPLASTY WITH ANTIBIOTIC SPACERS Left 08/06/2015   Procedure: EXCISION OF LEFT TOTAL KNEE ARTHROPLASTY WITH PLACEMENT OF ANTIBIOTIC SPACERS;   Surgeon: Kathryne Hitch, MD;  Location: [facility];  Service: Orthopedics;  Laterality: Left;  . HAND TENDON SURGERY Right 06/2012   done @ [facility] in [place]  . I&D KNEE WITH POLY EXCHANGE Left 05/07/2015   Procedure: IRRIGATION AND DEBRIDEMENT LEFT KNEE WITH POLY EXCHANGE;  Surgeon: Kathryne Hitch, MD;  Location: [facility];  Service: Orthopedics;  Laterality: Left;  . KNEE ARTHROSCOPY Left 06/22/2015   Procedure: LEFT KNEE ARTHROSCOPY WITH DEBRIDEMENT, SYNOVECTOMY;  Surgeon: Kathryne Hitch, MD;  Location: [facility] OR;  Service: Orthopedics;  Laterality: Left;  . LUMBAR PERCUTANEOUS PEDICLE SCREW 2 LEVEL  09/13/2012   Procedure: LUMBAR PERCUTANEOUS PEDICLE SCREW 2 LEVEL;  Surgeon: Reinaldo Meeker, MD;  Location: [facility];  Service: Neurosurgery;  Laterality: Left;  left pedicle screws two -three,three-four  . ROTATOR CUFF REPAIR Right 2008    done in [facility]  . SHOULDER ARTHROSCOPY Right 10/02/2016  . TOTAL HIP ARTHROPLASTY Right 01/2012   done in [facility]  . TOTAL KNEE ARTHROPLASTY Left 01/08/2015   Procedure: LEFT TOTAL KNEE ARTHROPLASTY;  Surgeon: Kathryne Hitch, MD;  Location: [facility];  Service: Orthopedics;  Laterality: Left;  . TOTAL KNEE REVISION Left 09/20/2015   Procedure: RE-IMPLANT/REVISION LEFT TOTAL KNEE ARTHROPLASTY;  Surgeon: Kathryne Hitch, MD;  Location: [facility] OR;  Service: Orthopedics;  Laterality: Left;    Family History  Problem Relation  Age of Onset  . CAD Father   . Alzheimer's disease Father    Social History:  reports that he has never smoked. He has never used smokeless tobacco. He reports that he drinks alcohol. He reports that he does not use drugs.  Allergies:  Allergies  Allergen Reactions  . Penicillins Rash    Has patient had a PCN reaction causing immediate rash, facial/tongue/throat swelling, SOB or lightheadedness with hypotension: No Has patient had a PCN reaction causing severe rash involving mucus membranes or skin  necrosis: No- around scars.  Has patient had a PCN reaction that required hospitalization No Has patient had a PCN reaction occurring within the last 10 years: No If all of the above answers are "NO", then may proceed with Cephalosporin use.     Medications Prior to Admission  Medication Sig Dispense Refill  . acetaminophen (TYLENOL) 500 MG tablet Take 500 mg by mouth every 6 (six) hours as needed.    Marland Kitchen atorvastatin (LIPITOR) 40 MG tablet Take 40 mg by mouth at bedtime.     . diclofenac (VOLTAREN) 75 MG EC tablet Take 75 mg by mouth 2 (two) times daily.     . DULoxetine (CYMBALTA) 60 MG capsule Take 60 mg by mouth 2 (two) times daily.     . folic acid (FOLVITE) 1 MG tablet Take 1 mg by mouth daily.     Marland Kitchen ibuprofen (ADVIL,MOTRIN) 200 MG tablet Take 800 mg by mouth every 6 (six) hours as needed for moderate pain.    Marland Kitchen lisinopril-hydrochlorothiazide (PRINZIDE,ZESTORETIC) 10-12.5 MG tablet Take 1 tablet by mouth daily.    . methotrexate (RHEUMATREX) 2.5 MG tablet Take 7.5 mg by mouth every 7 (seven) days. On Sunday  Will stop 1 week prior to procedure    . methylphenidate (RITALIN) 10 MG tablet Take 10 mg by mouth daily. Only takes once daily    . oxyCODONE (OXY IR/ROXICODONE) 5 MG immediate release tablet Take 1-2 tablets (5-10 mg total) by mouth every 6 (six) hours as needed for severe pain. (Patient taking differently: Take 5-10 mg by mouth every 6 (six) hours as needed for severe pain. ) 60 tablet 0  . pantoprazole (PROTONIX) 20 MG tablet Take 20 mg by mouth daily.    . pramipexole (MIRAPEX) 1 MG tablet Take 1 mg by mouth 3 (three) times daily.    . tamsulosin (FLOMAX) 0.4 MG CAPS capsule Take 0.4 mg by mouth daily after breakfast.     . ondansetron (ZOFRAN) 4 MG tablet Take 4 mg by mouth every 8 (eight) hours as needed for nausea or vomiting.       No results found for this or any previous visit (from the past 48 hour(s)). No results found.  Pertinent items noted in HPI and remainder of  comprehensive ROS otherwise negative.  Blood pressure (!) 151/79, pulse 81, temperature 97.6 F (36.4 C), temperature source Oral, resp. rate 20, weight 81.2 kg (179 lb), SpO2 97 %.  Patient is awake and alert. He is oriented and appropriate. His speech is fluent. His judgment and insight are intact. Cranial nerve function normal bilaterally. Motor examination reveals weakness of her lateral hip flexors and quadriceps muscles grating out of 4 minus over 5. Patient with decreased sensation to pinprick and light touch in bilateral L2 and L3 dermatomes. Deep tendon refills hypoactive but symmetric. No evidence of long track signs. Examination head ears eyes and throat are marked. Chest and abdomen are benign. Extremities are free from injury or  deformity. Assessment/Plan L1-L2 herniated mucous pulposis with severe stenosis and neurogenic claudication. Plan bilateral L1 and L2 decompressive laminotomies and foraminotomies followed by posterior lumbar interbody fusion utilizing interbody expandable cages, locally harvested autograft and posterior lateral arthrodesis with pedicle screw fixation. Risks metaphyseal been explained. Patient wishes to proceed.  Agnes Brightbill A 10/30/2016, 11:13 AM

## 2016-10-30 NOTE — Transfer of Care (Signed)
Immediate Anesthesia Transfer of Care Note  Patient: [name]  Procedure(s) Performed: Procedure(s): LUMBAR ONE-TWO POSTERIOR LUMBAR FUSION WITH REVISION OF LUMBAR ONE-FOUR POSTERIOR LATERAL FUSION (N/A)  Patient Location: PACU  Anesthesia Type:General  Level of Consciousness: awake and alert   Airway & Oxygen Therapy: Patient Spontanous Breathing and Patient connected to face mask oxygen  Post-op Assessment: Report given to RN, Post -op Vital signs reviewed and stable and Patient moving all extremities X 4  Post vital signs: Reviewed and stable  Last Vitals:  Vitals:   10/30/16 0917 10/30/16 1543  BP: (!) 151/79 113/65  Pulse: 81 96  Resp: 20 11  Temp: 36.4 C 36.7 C    Last Pain:  Vitals:   10/30/16 1554  TempSrc:   PainSc: 8          Complications: No apparent anesthesia complications

## 2016-10-30 NOTE — Op Note (Signed)
Date of procedure: 10/30/2016  Date of dictation: Same  Service: Neurosurgery  Preoperative diagnosis: L1-L2 herniated nucleus pulposus with stenosis and neurogenic claudication.  Status post L2-L4 fusion with possible L3-4 pseudoarthrosis  Postoperative diagnosis: L1-2 herniated nucleus pulposus with stenosis and neurogenic claudication  L3-4 pseudoarthrosis with hardware failure  Procedure Name: Reexploration of L2-3 for posterior lateral arthrodesis with removal of failed instrumentation and revision bilateral posterior lateral fusion with segmental pedicle screw fixation from L1-L4 utilizing local autograft, morselized allograft, and bone morphogenic protein.  L1-L2 bilateral decompressive laminotomies and foraminotomies with bilateral L1 to microdiscectomy, more than would be required for simple interbody fusion alone.  L1-L2 posterior lumbar interbody fusion utilizing interbody expandable cages and morselized autograft  Surgeon:Montzerrat Brunell A.Emilyann Banka, M.D.  Asst. Surgeon: Danielle Dess  Anesthesia: General  Indication: Patient is a 61 year old male status post anterior posterior lumbar fusion by Dr. Gerlene Fee. Patient presents now with severe back and bilateral lower extremity symptoms. Workup demonstrates evidence of junctional kyphosis above the level of his perfusion with a large superiorly migrated disc fragment with severe stenosis at L1-L2. Workup also demonstrates probable pseudoarthrosis at L3-4 although this is not definite. Fusion at L2-3 and prior fusion at L4-5 appear solid.  Operative note: After induction of anesthesia, patient positioned prone on the Wilson frame and appropriately padded. Lumbar region prepped and draped sterilely. Incision made from L1-L4. Dissection performed on the left side. Previously placed pedicle screw instrumentation from L2-L4 were dissected free. The rod and screw construct was disassembled. Fusion at L2-3 appeared solid. Fusion L3-4 was definitely not solid.  The screw at L4 partially torn out. At this point I decided that revision of his prior posterolateral fusion would be necessary and be necessary to be performed bilaterally. Dissection was then performed bilaterally exposing lamina and transverse processes of L1 and L2 L3 L4 and L5. Retractor placed. Fluoroscopy used. Levels confirmed. Bilateral decompressive laminotomies and facetectomies were performed bilaterally at L1-L2. The inferior two thirds of the lamina of L1 were removed bilaterally. Complete inferior facetectomies L1 were performed bilaterally. Superior facetectomies of L2 were performed bilaterally. Ligament flavum was elevated and resected. Epidural venous plexus coagulated and cut. Bilateral microdiscectomy was then performed by incising the disc space removing disc material using pituitary rongeurs. There was a large amount of superiorly migrated disc herniation right greater than left. This was fully resected and the spinal canal was well decompressed. Interspaces and prepared for interbody fusion. Disc space was cleaned of all soft tissue with various scrapers and curettes. A 10 mm distractor was placed in the patient's right side. Thecal sac and nerve respect to the left side. An 8 mm standard Med[employer]onic expandable cage was then impacted in place and expanded to its full extent. This cage was packed with locally harvested autograft. The distractor was removed patient's contralateral side. Disc space on this side was prepared for interbody fusion. Morcellized autograft was packed into the interspace. A second cage packed with autograft was then impacted in place and expanded to its full extent. Pedicles of L1-L2 L3-L4 were identified using surface landmarks and intraoperative fluoroscopy bilaterally. Bilateral were drilled using a high-speed drill. Each pilot hole was probed using a pedicle awl each pedicle awl track was then tapped with a screw. Each screw Hole was probed and found to be solidly  within the bone. 6.75 mm radius branch screws from [employer] or placed bilaterally at L1-L2 L3-L4. Short segment titanium rods placed over the screw instrument L1-L4. A locking caps placed over the  screw interlocking Engaged with a Construct under Compression. Final Images Reveal Good Position of the Cages and Screws the Proper Level with Normal Alignment of Spine. Wound Was Irrigated One Final Time. The Facet Joints and Lamina of L2-L3 and L4 Were Decorticated Using High-Speed Drill. Morselized Autograft Mixed with Magna fuse packets were packed along the lamina and facet joints from L2-L4 as were bone morphogenic protein-soaked sponges. Transverse processes of L1 and L2 were decorticated. Morcellized autograft and allograft was packed posterolaterally along the transverse processes this level. Wound was then irrigated with antibiotic solution. Gelfoam was placed topically over the laminectomy defect. Vancomycin power was placed the deep wound . Hemovac drain was placed in deep wound . Wound is then closed in layers with Vicryl sutures. Steri-Strips and sterile dressing were applied. No apparent complications. Patient tolerated the procedure well and he returns to the recovery room postoperatively

## 2016-10-30 NOTE — Progress Notes (Signed)
Vancomycin per Pharmacy for surgical prophylaxis   61 YO M s/p posterior lumbar fusion, on vancomycin for surgical prophylaxis d/t penicillin allergy, pharmacy to dose vancomycin for post-op prophylaxis. Scr 0.99 on 3/14, est. crcl ~ 80 ml/min. Received pre-op dose 1g vancomycin at ~ 1200. Hemovac drain was placed per surgery note.   Plan: Vancomycin 1000 mg IV Q 12 hrs, next dose at midnight. Monitor renal function and drain status Check vancomycin trough at steady state if vancomycin will be continued for > 72 hrs  Thanks.   Bayard Hugger, PharmD, BCPS  Clinical Pharmacist  Pager: [phone]

## 2016-10-31 MED ORDER — CYCLOBENZAPRINE HCL 10 MG PO TABS: 10.0000 mg | ORAL_TABLET | Freq: Three times a day (TID) | ORAL | 0 refills | Status: DC | PRN

## 2016-10-31 MED ORDER — OXYCODONE HCL 5 MG PO TABS: 5.0000 mg | ORAL_TABLET | Freq: Four times a day (QID) | ORAL | 0 refills | Status: DC | PRN

## 2016-10-31 NOTE — Discharge Summary (Signed)
Physician Discharge Summary  Patient ID: [name] MRN: [id number] DOB/AGE: [birth year] 61 y.o.  Admit date: 10/30/2016 Discharge date: 10/31/2016  Admission Diagnoses:  Discharge Diagnoses:  Active Problems:   Lumbar stenosis with neurogenic claudication   Discharged Condition: good  Hospital Course: Patient admitted to the hospital where he underwent an uncomplicated L1-2 posterior lumbar interbody fusion as well as L1-L4 posterior lateral arthrodesis for treatment of spinal stenosis and L3-4 pseudoarthrosis. Postoperatively patient is doing very well. Preoperative back and lower extremity pain much improved. Ambulating without difficulty. Ready for discharge home.  Consults:   Significant Diagnostic Studies:   Treatments:   Discharge Exam: Blood pressure 117/70, pulse 88, temperature 97.5 F (36.4 C), resp. rate 18, weight 81.2 kg (179 lb), SpO2 100 %. Awake and alert. Oriented and appropriate. Cranial nerve function intact. Motor and sensory function extremities normal. Wound clean and dry. Chest and abdomen benign.  Disposition: 06-Home-Health Care Svc   Allergies as of 10/31/2016      Reactions   Penicillins Rash   Has patient had a PCN reaction causing immediate rash, facial/tongue/throat swelling, SOB or lightheadedness with hypotension: No Has patient had a PCN reaction causing severe rash involving mucus membranes or skin necrosis: No- around scars.  Has patient had a PCN reaction that required hospitalization No Has patient had a PCN reaction occurring within the last 10 years: No If all of the above answers are "NO", then may proceed with Cephalosporin use.      Medication List    TAKE these medications   acetaminophen 500 MG tablet Commonly known as:  TYLENOL Take 500 mg by mouth every 6 (six) hours as needed.   atorvastatin 40 MG tablet Commonly known as:  LIPITOR Take 40 mg by mouth at bedtime.   cyclobenzaprine 10 MG tablet Commonly known as:   FLEXERIL Take 1 tablet (10 mg total) by mouth 3 (three) times daily as needed for muscle spasms.   diclofenac 75 MG EC tablet Commonly known as:  VOLTAREN Take 75 mg by mouth 2 (two) times daily.   DULoxetine 60 MG capsule Commonly known as:  CYMBALTA Take 60 mg by mouth 2 (two) times daily.   folic acid 1 MG tablet Commonly known as:  FOLVITE Take 1 mg by mouth daily.   ibuprofen 200 MG tablet Commonly known as:  ADVIL,MOTRIN Take 800 mg by mouth every 6 (six) hours as needed for moderate pain.   lisinopril-hydrochlorothiazide 10-12.5 MG tablet Commonly known as:  PRINZIDE,ZESTORETIC Take 1 tablet by mouth daily.   methotrexate 2.5 MG tablet Commonly known as:  RHEUMATREX Take 7.5 mg by mouth every 7 (seven) days. On Sunday  Will stop 1 week prior to procedure   methylphenidate 10 MG tablet Commonly known as:  RITALIN Take 10 mg by mouth daily. Only takes once daily   ondansetron 4 MG tablet Commonly known as:  ZOFRAN Take 4 mg by mouth every 8 (eight) hours as needed for nausea or vomiting.   oxyCODONE 5 MG immediate release tablet Commonly known as:  Oxy IR/ROXICODONE Take 1-2 tablets (5-10 mg total) by mouth every 6 (six) hours as needed for severe pain.   pantoprazole 20 MG tablet Commonly known as:  PROTONIX Take 20 mg by mouth daily.   pramipexole 1 MG tablet Commonly known as:  MIRAPEX Take 1 mg by mouth 3 (three) times daily.   tamsulosin 0.4 MG Caps capsule Commonly known as:  FLOMAX Take 0.4 mg by mouth daily after breakfast.  Durable Medical Equipment        Start     Ordered   10/30/16 1732  DME Walker rolling  Once    Question:  Patient needs a walker to treat with the following condition  Answer:  Lumbar stenosis with neurogenic claudication   10/30/16 1731   10/30/16 1732  DME 3 n 1  Once     10/30/16 1731       Signed: Orlanda Lemmerman A 10/31/2016, 12:46 PM

## 2016-10-31 NOTE — Evaluation (Signed)
Physical Therapy Evaluation and Discharge Patient Details Name: Alexandria Current MRN: [id number] DOB: 1955/09/08 Today's Date: 10/31/2016   History of Present Illness  Pt is a 61 y/o male who presents s/p L1-L2 posterior lumbar fusion on 10/30/16.  Clinical Impression  Patient evaluated by Physical Therapy with no further acute PT needs identified. All education has been completed and the patient has no further questions. At the time of PT eval pt was able to perform transfers and ambulation with modified independence. Pt was educated on precautions, general safety with functional mobility, and walking program upon d/c. See below for any follow-up Physical Therapy or equipment needs. PT is signing off. Thank you for this referral.     Follow Up Recommendations No PT follow up    Equipment Recommendations  None recommended by PT    Recommendations for Other Services       Precautions / Restrictions Precautions Precautions: Fall;Back Precaution Booklet Issued: Yes (comment) Precaution Comments: Reviewed in detail with pt and wife. Pt was cued for precautions during functional mobility.  Required Braces or Orthoses: Spinal Brace Spinal Brace: Lumbar corset;Applied in sitting position Restrictions Weight Bearing Restrictions: No      Mobility  Bed Mobility Overal bed mobility: Needs Assistance Bed Mobility: Rolling;Sidelying to Sit Rolling: Modified independent (Device/Increase time) Sidelying to sit: Modified independent (Device/Increase time)       General bed mobility comments: HOB flat and rails lowered to simulate home environment  Transfers Overall transfer level: Modified independent Equipment used: None             General transfer comment: Minimal use of hands. Able to maintain precautions well with no assist required.   Ambulation/Gait Ambulation/Gait assistance: Modified independent (Device/Increase time) Ambulation Distance (Feet): 400 Feet Assistive  device: None Gait Pattern/deviations: Step-through pattern;Decreased stride length   Gait velocity interpretation: Below normal speed for age/gender    Stairs Stairs: Yes Stairs assistance: Supervision Stair Management: One rail Right;Step to pattern;Forwards Number of Stairs: 3 General stair comments: VC's for general sequencing and safety  Wheelchair Mobility    Modified Rankin (Stroke Patients Only)       Balance Overall balance assessment: No apparent balance deficits (not formally assessed)                                           Pertinent Vitals/Pain Pain Assessment: Faces Faces Pain Scale: Hurts a little bit Pain Location: Incision site Pain Descriptors / Indicators: Operative site guarding Pain Intervention(s): Limited activity within patient's tolerance;Monitored during session;Repositioned    Home Living Family/patient expects to be discharged to:: Private residence Living Arrangements: Spouse/significant other Available Help at Discharge: Family;Available 24 hours/day (Initially if needed) Type of Home: House Home Access: Stairs to enter Entrance Stairs-Rails: None Entrance Stairs-Number of Steps: 2 Home Layout: Two level;Able to live on main level with bedroom/bathroom Home Equipment: Crutches;Walker - 2 wheels;Bedside commode;Grab bars - tub/shower      Prior Function Level of Independence: Independent               Hand Dominance   Dominant Hand: Right    Extremity/Trunk Assessment   Upper Extremity Assessment Upper Extremity Assessment: Defer to OT evaluation    Lower Extremity Assessment Lower Extremity Assessment: Generalized weakness (Consistent with pre-op diagnosis)    Cervical / Trunk Assessment Cervical / Trunk Assessment: Other exceptions Cervical / Trunk Exceptions: s/p lumbar  surgery  Communication   Communication: No difficulties  Cognition Arousal/Alertness: Awake/alert Behavior During Therapy: WFL  for tasks assessed/performed Overall Cognitive Status: Within Functional Limits for tasks assessed                      General Comments      Exercises     Assessment/Plan    PT Assessment Patent does not need any further PT services  PT Problem List         PT Treatment Interventions      PT Goals (Current goals can be found in the Care Plan section)  Acute Rehab PT Goals PT Goal Formulation: All assessment and education complete, DC therapy    Frequency     Barriers to discharge        Co-evaluation               End of Session Equipment Utilized During Treatment: Gait belt;Back brace Activity Tolerance: Patient tolerated treatment well Patient left: in bed;with call bell/phone within reach;with family/visitor present Nurse Communication: Mobility status PT Visit Diagnosis: Pain         Time: 1005-1026 PT Time Calculation (min) (ACUTE ONLY): 21 min   Charges:   PT Evaluation $PT Eval Moderate Complexity: 1 Procedure     PT G CodesMarylynn Pearson 10/31/2016, 11:04 AM   Conni Slipper, PT, DPT Acute Rehabilitation Services Pager: [phone]

## 2016-10-31 NOTE — Anesthesia Postprocedure Evaluation (Addendum)
Anesthesia Post Note  Patient: [name]  Procedure(s) Performed: Procedure(s) (LRB): LUMBAR ONE-TWO POSTERIOR LUMBAR FUSION WITH REVISION OF LUMBAR ONE-FOUR POSTERIOR LATERAL FUSION (N/A)  Patient location during evaluation: PACU Anesthesia Type: General Level of consciousness: awake and alert and patient cooperative Pain management: pain level controlled Vital Signs Assessment: post-procedure vital signs reviewed and stable Respiratory status: spontaneous breathing and respiratory function stable Cardiovascular status: stable Anesthetic complications: no       Last Vitals:  Vitals:   10/31/16 0738 10/31/16 1218  BP: 118/80 117/70  Pulse: 88 88  Resp: 18 18  Temp: 36.9 C 36.4 C    Last Pain:  Vitals:   10/31/16 1036  TempSrc:   PainSc: 6                  Vennie Salsbury S

## 2016-10-31 NOTE — Discharge Instructions (Signed)

## 2016-10-31 NOTE — Evaluation (Signed)
Occupational Therapy Evaluation Patient Details Name: [name] MRN: [id number] DOB: [birth year] Today's Date: 10/31/2016    History of Present Illness Pt is a 61 y/o male who presents s/p L1-L2 posterior lumbar fusion on 10/30/16.   Clinical Impression   Patient evaluated by Occupational Therapy with no further acute OT needs identified. All education has been completed and the patient has no further questions. See below for any follow-up Occupational Therapy or equipment needs. OT to sign off. Thank you for referral.      Follow Up Recommendations  No OT follow up    Equipment Recommendations  None recommended by OT    Recommendations for Other Services       Precautions / Restrictions Precautions Precautions: Fall;Back Precaution Booklet Issued: Yes (comment) Precaution Comments: handout present and reviewed in detail for adls Required Braces or Orthoses: Spinal Brace Spinal Brace: Lumbar corset;Applied in sitting position Restrictions Weight Bearing Restrictions: No      Mobility Bed Mobility Overal bed mobility: Needs Assistance Bed Mobility: Rolling;Sidelying to Sit Rolling: Modified independent (Device/Increase time) Sidelying to sit: Modified independent (Device/Increase time)       General bed mobility comments: HOB flat and rails lowered to simulate home environment  Transfers Overall transfer level: Modified independent Equipment used: None             General transfer comment: observed prior to session in hall with wife as well    Balance Overall balance assessment: No apparent balance deficits (not formally assessed)                                          ADL Overall ADL's : Modified independent                                       General ADL Comments: pt able to cross bil LE and don brace. pt able to verbalize wear schedule for brace. pt encouraged to order lunch at this time due to pending d/c  times   Back handout provided and reviewed adls in detail. Pt educated on: clothing between brace, never sleep in brace, set an alarm at night for medication, avoid sitting for long periods of time, correct bed positioning for sleeping, correct sequence for bed mobility, avoiding lifting more than 5 pounds and never wash directly over incision. All education is complete and patient indicates understanding.    Vision Baseline Vision/History: Wears glasses Wears Glasses: At all times       Perception     Praxis      Pertinent Vitals/Pain Pain Assessment: Faces Faces Pain Scale: Hurts a little bit Pain Location: Incision site Pain Descriptors / Indicators: Operative site guarding Pain Intervention(s): Limited activity within patient's tolerance;Monitored during session;Repositioned     Hand Dominance Right   Extremity/Trunk Assessment Upper Extremity Assessment Upper Extremity Assessment: Overall WFL for tasks assessed   Lower Extremity Assessment Lower Extremity Assessment: Defer to PT evaluation   Cervical / Trunk Assessment Cervical / Trunk Assessment: Other exceptions Cervical / Trunk Exceptions: s/p lumbar surgery   Communication Communication Communication: No difficulties   Cognition Arousal/Alertness: Awake/alert Behavior During Therapy: WFL for tasks assessed/performed Overall Cognitive Status: Within Functional Limits for tasks assessed  General Comments  educated on washing around incision and never directly on the wound    Exercises       Shoulder Instructions      Home Living Family/patient expects to be discharged to:: Private residence Living Arrangements: Spouse/significant other Available Help at Discharge: Family;Available 24 hours/day (Initially if needed) Type of Home: House Home Access: Stairs to enter [employer] of Steps: 2 Entrance Stairs-Rails: None Home Layout: Two level;Able to live on main level  with bedroom/bathroom Alternate Level Stairs-Number of Steps: 7 and 7 Alternate Level Stairs-Rails: Left Bathroom Shower/Tub: Producer, television/film/video: Standard Bathroom Accessibility: Yes   Home Equipment: Crutches;Walker - 2 wheels;Bedside commode;Grab bars - tub/shower          Prior Functioning/Environment Level of Independence: Independent                 OT Problem List:        OT Treatment/Interventions:      OT Goals(Current goals can be found in the care plan section)    OT Frequency:     Barriers to D/C:            Co-evaluation              End of Session Equipment Utilized During Treatment: Back brace  Activity Tolerance: Patient tolerated treatment well Patient left: in bed;with call bell/phone within reach;with family/visitor present  OT Visit Diagnosis: Pain Pain - part of body:  (back)                ADL either performed or assessed with clinical judgement  Time: 3825-0539 OT Time Calculation (min): 22 min Charges:  OT General Charges $OT Visit: 1 Procedure OT Evaluation $OT Eval Moderate Complexity: 1 Procedure G-Codes:      Mateo Flow   OTR/L Pager: [phone] Office: [phone] .   Boone Master B 10/31/2016, 2:27 PM

## 2016-10-31 NOTE — Progress Notes (Signed)
Patient alert and oriented, mae's well, voiding adequate amount of urine, swallowing without difficulty, no c/o pain at time of discharge. Patient discharged home with family. Script and discharged instructions given to patient. Patient and family stated understanding of instructions given. Patient has an appointment with Dr. Pool  

## 2016-11-01 MED FILL — Heparin Sodium (Porcine) Inj 1000 Unit/ML: INTRAMUSCULAR | Qty: 30 | Status: AC

## 2016-11-01 MED FILL — Sodium Chloride IV Soln 0.9%: INTRAVENOUS | Qty: 1000 | Status: AC

## 2017-01-23 ENCOUNTER — Ambulatory Visit (INDEPENDENT_AMBULATORY_CARE_PROVIDER_SITE_OTHER): Payer: Medicare HMO | Admitting: Orthopaedic Surgery

## 2017-01-29 ENCOUNTER — Ambulatory Visit (INDEPENDENT_AMBULATORY_CARE_PROVIDER_SITE_OTHER): Payer: Medicare HMO | Admitting: Orthopaedic Surgery

## 2017-01-31 ENCOUNTER — Ambulatory Visit (INDEPENDENT_AMBULATORY_CARE_PROVIDER_SITE_OTHER): Payer: Medicare HMO | Admitting: Rehabilitative and Restorative Service Providers"

## 2017-01-31 ENCOUNTER — Encounter: Payer: Self-pay | Admitting: Rehabilitative and Restorative Service Providers"

## 2017-01-31 DIAGNOSIS — M545 Low back pain, unspecified: Secondary | ICD-10-CM

## 2017-01-31 DIAGNOSIS — M6281 Muscle weakness (generalized): Secondary | ICD-10-CM

## 2017-01-31 DIAGNOSIS — R29898 Other symptoms and signs involving the musculoskeletal system: Secondary | ICD-10-CM | POA: Diagnosis not present

## 2017-01-31 NOTE — Therapy (Signed)
[facility] Out[facility] Rehabilitation [place] [address] [place], [place], [address] Phone: [phone]   Fax:  [phone]  Physical Therapy Evaluation  Patient Details  Name: [name] MRN: [id number] Date of Birth: 26-Nov-1955 Referring Provider: Dr Julio Sicks   Encounter Date: 01/31/2017      PT End of Session - 01/31/17 1108    Visit Number 1   Number of Visits 8   Date for PT Re-Evaluation 02/28/17   PT Start Time 1109   PT Stop Time 1210  Simultaneous filing. User may not have seen previous data.   PT Time Calculation (min) 61 min  Simultaneous filing. User may not have seen previous data.   Activity Tolerance Patient tolerated treatment well      Past Medical History:  Diagnosis Date  . ADD (attention deficit disorder)    takes Ritalin daily  . Anemia    denies  . Anxiety   . Arthritis   . Cataract    left eye  . Depression    takes Cymbalta daily  . Enlarged prostate    takes Flomax daily   . GERD (gastroesophageal reflux disease)    takes Omeprazole daily  . Headache    hx of migraines-none recent  . History of blood transfusion   . History of kidney stones    x1 15 years ago  . Hypertension   . Numbness and tingling    fingers bilat comes and goes   . OCD (obsessive compulsive disorder)   . Restless leg syndrome    takes requip daily  . Rheumatoid arthritis(714.0)    sees Dr Sharmon Revere @ [facility] in [place]  . Wears glasses     Past Surgical History:  Procedure Laterality Date  . ABDOMINAL EXPOSURE  09/13/2012   Procedure: ABDOMINAL EXPOSURE;  Surgeon: Larina Earthly, MD;  Location: [facility];  Service: Vascular;  Laterality: N/A;  . ANTERIOR LAT LUMBAR FUSION  09/13/2012   Procedure: ANTERIOR LATERAL LUMBAR FUSION 2 LEVELS;  Surgeon: Reinaldo Meeker, MD;  Location: [facility];  Service: Neurosurgery;  Laterality: Right;  lumbar two three,three-four  . ANTERIOR LUMBAR FUSION  09/13/2012   Procedure: ANTERIOR  LUMBAR FUSION 1 LEVEL;  Surgeon: Reinaldo Meeker, MD;  Location: [facility];  Service: Neurosurgery;  Laterality: N/A;  lumbar four-five  . EXCISIONAL TOTAL KNEE ARTHROPLASTY WITH ANTIBIOTIC SPACERS Left 08/06/2015   Procedure: EXCISION OF LEFT TOTAL KNEE ARTHROPLASTY WITH PLACEMENT OF ANTIBIOTIC SPACERS;  Surgeon: Kathryne Hitch, MD;  Location: [facility];  Service: Orthopedics;  Laterality: Left;  . HAND TENDON SURGERY Right 06/2012   done @ [facility] in [place]  . I&D KNEE WITH POLY EXCHANGE Left 05/07/2015   Procedure: IRRIGATION AND DEBRIDEMENT LEFT KNEE WITH POLY EXCHANGE;  Surgeon: Kathryne Hitch, MD;  Location: [facility];  Service: Orthopedics;  Laterality: Left;  . KNEE ARTHROSCOPY Left 06/22/2015   Procedure: LEFT KNEE ARTHROSCOPY WITH DEBRIDEMENT, SYNOVECTOMY;  Surgeon: Kathryne Hitch, MD;  Location: [facility];  Service: Orthopedics;  Laterality: Left;  . LUMBAR PERCUTANEOUS PEDICLE SCREW 2 LEVEL  09/13/2012   Procedure: LUMBAR PERCUTANEOUS PEDICLE SCREW 2 LEVEL;  Surgeon: Reinaldo Meeker, MD;  Location: [facility];  Service: Neurosurgery;  Laterality: Left;  left pedicle screws two -three,three-four  . ROTATOR CUFF REPAIR Right 2008    done in [facility]  . SHOULDER ARTHROSCOPY Right 10/02/2016  . TOTAL HIP ARTHROPLASTY Right 01/2012   done in [facility]  .  TOTAL KNEE ARTHROPLASTY Left 01/08/2015   Procedure: LEFT TOTAL KNEE ARTHROPLASTY;  Surgeon: Kathryne Hitch, MD;  Location: [facility];  Service: Orthopedics;  Laterality: Left;  . TOTAL KNEE REVISION Left 09/20/2015   Procedure: RE-IMPLANT/REVISION LEFT TOTAL KNEE ARTHROPLASTY;  Surgeon: Kathryne Hitch, MD;  Location: [facility] OR;  Service: Orthopedics;  Laterality: Left;    There were no vitals filed for this visit.       Subjective Assessment - 01/31/17 1117    Subjective Patient reports that he had his second back surgery 10/20/16 for herniated disc and spinal fusion L3/4. He has done OK since the surgery.  He would like to learn some exercises to strengthen his back. He does a good bit of bending with his work.    Pertinent History Previous lumbar surgery 1/14 for intermal fixation with multilevel fusion.L1 or L2 to L5; Lt TKA 5/16 and repeat 2/17; surgery Rt shoulder 2/18    How long can you sit comfortably? 2-3 hours   How long can you stand comfortably? 2-3 hours    How long can you walk comfortably? 1-2 hours    Diagnostic tests xrays MRI    Patient Stated Goals learn exercises to strengthen back    Currently in Pain? No/denies            [facility] PT Assessment - 01/31/17 0001      Assessment   Medical Diagnosis Lumbar fusion    Referring Provider Dr Julio Sicks    Onset Date/Surgical Date 10/19/16   Hand Dominance Right   Next MD Visit PRN   Prior Therapy for TKA     Precautions   Precaution Comments spinal fusion      Balance Screen   Has the patient fallen in the past 6 months No   Has the patient had a decrease in activity level because of a fear of falling?  No   Is the patient reluctant to leave their home because of a fear of falling?  No     Prior Function   Level of Independence Independent     Observation/Other Assessments   Skin Integrity lumbar incision with area of white suture vs eschar at lower 1/4th of incision with smaller areas proximally that have eschar covering two additional areas 1/16 to 1/8 inch in length    Focus on Therapeutic Outcomes (FOTO)  43% limitation      Observation/Other Assessments-Edema    Edema --  edema noted Lt knee      Sensation   Additional Comments WFL's per pt report except lateral Lt knee      Posture/Postural Control   Posture Comments head forward; shoudlers rounded and elevated; increased thoracic kyphosis; Lt hsoulder elevated compared to Rt      AROM   Lumbar Flexion 80%  pull and discomfort center LB    Lumbar Extension 50%   Lumbar - Right Side Bend 85%  pull Lt hip    Lumbar - Left Side Bend 90%  pull Rt LB     Lumbar - Right Rotation 50%   Lumbar - Left Rotation 50%     Strength   Overall Strength Comments 5/5 bialt LE's - weak core contraction      Flexibility   Hamstrings ~85 deg bilat    Quadriceps tight Lt    ITB WFL's   Piriformis WFL's      Palpation   Spinal mobility unable to assess due to multilevel fusion    Palpation comment  muscular tightness bilat lumbar spine through paraspinals and QL/lats             Objective measurements completed on examination: See above findings.          [facility] Adult PT Treatment/Exercise - 01/31/17 0001      Exercises   Exercises Lumbar     Lumbar Exercises: Standing   Shoulder Extension Strengthening;10 reps;Theraband  core engaged.    Theraband Level (Shoulder Extension) Level 2 (Red);Level 3 (Green)     Lumbar Exercises: Supine   Ab Set 10 reps  10 sec hold, 3 part core explained.   Clam 10 reps  with ab set   Bent Knee Raise 10 reps  with ab set   Bridge 10 reps  with core engaged                PT Education - 01/31/17 1203    Education provided Yes   Education Details HEP, pt issued green band,    Person(s) Educated Patient   Methods Explanation;Handout;Demonstration;Tactile cues;Verbal cues   Comprehension Verbalized understanding;Returned demonstration             PT Long Term Goals - 01/31/17 1112      PT LONG TERM GOAL #1   Title Patient to demonstrate good core contraction 02/28/17   Time 4   Period Weeks   Status New     PT LONG TERM GOAL #2   Title Patient to maintain core contraction for stabilization exercises 02/28/17   Time 4   Period Weeks   Status New     PT LONG TERM GOAL #3   Title Patient reports improved functional abilities with decreased pain and improved stability 02/28/17   Time 4   Status New     PT LONG TERM GOAL #4   Title I in HEP 02/28/17   Time 4   Period Weeks   Status New     PT LONG TERM GOAL #5   Title Improve FOTO to </= 36% limitation 02/28/17    Time 4   Period Weeks   Status New                Plan - 01/31/17 1230    Clinical Impression Statement Alinda Money presents for education in lumbar strengthening program following lumbar fusion 10/19/16. He has limited trunk mobilty and ROM; core weakness; difficulty tolerating functional activities. The lumbar incision has 3 small areas that are not fully healed - took a picture of area with patient's cell phone and advised patient to watch the area and contact MD with any symptoms. Alinda Money will benefit from PT for instruction in core stabilization and strengthening.    Clinical Presentation Stable   Clinical Decision Making Low   Rehab Potential Good   PT Frequency 1x / week   PT Duration 4 weeks   PT Treatment/Interventions Patient/family education;ADLs/Self Care Home Management;Cryotherapy;Electrical Stimulation;Iontophoresis 4mg /ml Dexamethasone;Moist Heat;Ultrasound;Dry needling;Manual techniques;Therapeutic activities;Therapeutic exercise   PT Next Visit Plan review HEP; progress core stabilization and strengthening (stretch hip flexors)   Consulted and Agree with Plan of Care Patient      Patient will benefit from skilled therapeutic intervention in order to improve the following deficits and impairments:  Postural dysfunction, Improper body mechanics, Increased muscle spasms, Decreased strength, Decreased activity tolerance  Visit Diagnosis: Acute bilateral low back pain without sciatica - Plan: PT plan of care cert/re-cert  Other symptoms and signs involving the musculoskeletal system - Plan: PT plan of care cert/re-cert  Muscle weakness (generalized) - Plan: PT plan of care cert/re-cert     Problem List Patient Active Problem List   Diagnosis Date Noted  . Lumbar stenosis with neurogenic claudication 10/30/2016  . 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 Status post  revision of total replacement of left knee 09/20/2015  . 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 Effusion of left knee joint; questionable prosthetic joint infection 05/07/2015  . Knee effusion 05/07/2015  . Status post total left knee replacement 01/08/2015  . Osteoarthritis of left knee 12/25/2014  . Lumbar spondylosis L2-5 09/14/2012    Tyre Beaver Rober Minion PT,MPH  01/31/2017, 12:52 PM  [facility] [address] [place], [place], [address] Phone: [phone]   Fax:  [phone]  Name: Sloane Junkin MRN: [id number] Date of Birth: 09/21/1955

## 2017-01-31 NOTE — Patient Instructions (Signed)
3 part core exercise    With neutral spine, tighten pelvic floor, then tighten abdominal muscles sucking your belly button to back bone, then tighten muscles in low back at waist. Hold 10 seconds. Repeat _10_ times. Do _several__ times a day.  * When you have mastered this, you can contract all 3 muscle groups at same time.  Knee Fold   Lie on back, legs bent, arms by sides. Exhale, lifting knee to chest. Inhale, returning. Keep abdominals flat, navel to spine. Repeat __10__ times, alternating legs. Do __2__ sessions per day.  Knee Drop   Keep pelvis stable. Without rotating hips, slowly drop knee to side, pause, return to center, bring knee across midline toward opposite hip. Feel obliques engaging. Repeat for ___10_ times each leg.  Please keep BOTH knees bent.  Therapeutic - Bridging    Lift buttocks, keeping back straight and arms on floor. Hold __1-5__ seconds. Repeat __10__ times.   Strengthening: Human resources officer core. Hold tubing with both hands, arms forward. Pull arms back, elbow straight. Repeat _10-30___ times per set. Do ____ sets per session. Do _1___ sessions per day.   [facility] [address] [place], [place] [address]  [phone] (office) [phone] (fax)

## 2017-02-07 ENCOUNTER — Ambulatory Visit (INDEPENDENT_AMBULATORY_CARE_PROVIDER_SITE_OTHER): Payer: Medicare HMO

## 2017-02-07 ENCOUNTER — Ambulatory Visit (INDEPENDENT_AMBULATORY_CARE_PROVIDER_SITE_OTHER): Payer: Medicare HMO | Admitting: Orthopaedic Surgery

## 2017-02-07 DIAGNOSIS — Z96652 Presence of left artificial knee joint: Secondary | ICD-10-CM

## 2017-02-07 NOTE — Progress Notes (Signed)
The patient is well-known to me. He is recovering from her revision knee arthroplasty that we performed in February 2017. He had the revision due to infection and a primary total knee. We have detected primary total knee in place antibiotic spacer from the current infection and then we revised the knee. He still gets occasional swelling in his knee and discomfort but is able to be active. The knee does get stiff on occasion. I told him that the stiffness is certainly normal after revision surgery.  On exam there still a mild effusion of his knee but no redness. He's got excellent range of motion of the knee and it feels ligaments stable. There is some clunking and clicking at the patella.  2 views of the left knee are obtained and compared to previous x-rays. Femoral and tibial components appear well seated. 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  All questions were encouraged and answered. 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 I would like to see him back in 6 months with a repeat AP and lateral of his left knee. Office and you can see him sooner if there is any issues. I do not feel we need to aspirate the knee today.

## 2017-02-08 ENCOUNTER — Ambulatory Visit (INDEPENDENT_AMBULATORY_CARE_PROVIDER_SITE_OTHER): Payer: Medicare HMO | Admitting: Physical Therapy

## 2017-02-08 DIAGNOSIS — M6281 Muscle weakness (generalized): Secondary | ICD-10-CM | POA: Diagnosis not present

## 2017-02-08 DIAGNOSIS — R29898 Other symptoms and signs involving the musculoskeletal system: Secondary | ICD-10-CM

## 2017-02-08 DIAGNOSIS — M545 Low back pain, unspecified: Secondary | ICD-10-CM

## 2017-02-08 NOTE — Therapy (Addendum)
[facility] [place], [place], [address] Phone: [phone]   Fax:  [phone]  Physical Therapy Treatment  Patient Details  Name: [name] MRN: [id number] Date of Birth: 1955/11/15 Referring Provider: Dr Earnie Larsson   Encounter Date: 02/08/2017      PT End of Session - 02/08/17 0932    Visit Number 2   Number of Visits 8   Date for PT Re-Evaluation 02/28/17   PT Start Time 0932   PT Stop Time 1018   PT Time Calculation (min) 46 min   Activity Tolerance Patient tolerated treatment well;No increased pain   Behavior During Therapy WFL for tasks assessed/performed      Past Medical History:  Diagnosis Date  . ADD (attention deficit disorder)    takes Ritalin daily  . Anemia    denies  . Anxiety   . Arthritis   . Cataract    left eye  . Depression    takes Cymbalta daily  . Enlarged prostate    takes Flomax daily   . GERD (gastroesophageal reflux disease)    takes Omeprazole daily  . Headache    hx of migraines-none recent  . History of blood transfusion   . History of kidney stones    x1 15 years ago  . Hypertension   . Numbness and tingling    fingers bilat comes and goes   . OCD (obsessive compulsive disorder)   . Restless leg syndrome    takes requip daily  . Rheumatoid arthritis(714.0)    sees Dr Gerilyn Nestle @ [facility] in [place]  . Wears glasses     Past Surgical History:  Procedure Laterality Date  . ABDOMINAL EXPOSURE  09/13/2012   Procedure: ABDOMINAL EXPOSURE;  Surgeon: Rosetta Posner, MD;  [facility]: [facility];  Service: Vascular;  Laterality: N/A;  . ANTERIOR LAT LUMBAR FUSION  09/13/2012   Procedure: ANTERIOR LATERAL LUMBAR FUSION 2 LEVELS;  Surgeon: Faythe Ghee, MD;  Location: [place] [facility];  Service: Neurosurgery;  Laterality: Right;  lumbar two three,three-four  . ANTERIOR LUMBAR FUSION  09/13/2012   Procedure: ANTERIOR LUMBAR FUSION 1 LEVEL;  Surgeon: Faythe Ghee, MD;  Location: [place] [facility];  Service: Neurosurgery;  Laterality: N/A;  lumbar four-five  . EXCISIONAL TOTAL KNEE ARTHROPLASTY WITH ANTIBIOTIC SPACERS Left 08/06/2015   Procedure: EXCISION OF LEFT TOTAL KNEE ARTHROPLASTY WITH PLACEMENT OF ANTIBIOTIC SPACERS;  Surgeon: Mcarthur Rossetti, MD;  Location: [facility];  Service: Orthopedics;  Laterality: Left;  . HAND TENDON SURGERY Right 06/2012   done @ [facility] in [place]  . I&D KNEE WITH POLY EXCHANGE Left 05/07/2015   Procedure: IRRIGATION AND DEBRIDEMENT LEFT KNEE WITH POLY EXCHANGE;  Surgeon: Mcarthur Rossetti, MD;  [facility];  Service: Orthopedics;  Laterality: Left;  . KNEE ARTHROSCOPY Left 06/22/2015   Procedure: LEFT KNEE ARTHROSCOPY WITH DEBRIDEMENT, SYNOVECTOMY;  Surgeon: Mcarthur Rossetti, MD;  Location: [facility];  Service: Orthopedics;  Laterality: Left;  . LUMBAR PERCUTANEOUS PEDICLE SCREW 2 LEVEL  09/13/2012   Procedure: LUMBAR PERCUTANEOUS PEDICLE SCREW 2 LEVEL;  Surgeon: Faythe Ghee, MD;  Location: [place] NEUR[facility] ORS;  Service: Neurosurgery;  Laterality: Left;  left pedicle screws two -three,three-four  . ROTATOR CUFF REPAIR Right 2008    done in [facility]  . SHOULDER ARTHROSCOPY Right 10/02/2016  . TOTAL HIP ARTHROPLASTY Right 01/2012   done in [facility]  . TOTAL KNEE ARTHROPLASTY Left 01/08/2015   Procedure:  LEFT TOTAL KNEE ARTHROPLASTY;  Surgeon: Mcarthur Rossetti, MD;  [facility];  Service: Orthopedics;  Laterality: Left;  . TOTAL KNEE REVISION Left 09/20/2015   Procedure: RE-IMPLANT/REVISION LEFT TOTAL KNEE ARTHROPLASTY;  Surgeon: Mcarthur Rossetti, MD;  Location: [facility];  Service: Orthopedics;  Laterality: Left;    There were no vitals filed for this visit.      Subjective Assessment - 02/08/17 0939    Subjective Pt states that he is feeling no pain, and that he's been a bit slack on the exercises, stating he's done it once since last visit.   Currently in Pain? No/denies           [facility] Adult PT Treatment/Exercise - 02/08/17 0001      Lumbar Exercises: Stretches   Active Hamstring Stretch --   Passive Hamstring Stretch 3 reps;30 seconds  supine, with strap   Prone on Elbows Stretch 20 seconds;5 reps   Press Ups Limitations Unable to tolerate due to rt shoulder pain   Piriformis Stretch 1 rep;20 seconds  each leg     Lumbar Exercises: Aerobic   Stationary Bike Nustep L5; 6 min     Lumbar Exercises: Standing   Row Both;10 reps;Theraband   Theraband Level (Row) Level 3 (Green)   Shoulder Extension Both;10 reps;Theraband  VC for posture and form   Theraband Level (Shoulder Extension) Level 3 (Green)   Other Standing Lumbar Exercises isometric hand press (pushing up with one hand/down with the other) in standing, shoulders flexed to 90 degrees. Pt felt discomfort in rt. shoulder, changed to sitting and 45 degree shoulder flexion     Lumbar Exercises: Seated   Long Arc Quad on Chair Right;Left;10 reps  With bolster between knees     Lumbar Exercises: Supine   Ab Set 5 reps;2 seconds   Glut Set 10 reps;5 seconds   Clam 10 reps;2 seconds   Bent Knee Raise 10 reps;2 seconds                PT Education - 02/08/17 1029    Education provided Yes   Education Details Pt was instructed to be more consistent with exercises in order to progress toward goals.    Person(s) Educated Patient   Methods Explanation;Demonstration;Tactile cues;Verbal cues   Comprehension Verbalized understanding;Returned demonstration             PT Long Term Goals - 02/08/17 1005      PT LONG TERM GOAL #1   Title Patient to demonstrate good core contraction 02/28/17   Time 4   Period Weeks   Status On-going  progressing     PT LONG TERM GOAL #2   Title Patient to maintain core contraction for stabilization exercises 02/28/17   Time 4   Period Weeks   Status On-going     PT LONG TERM GOAL #3   Title Patient reports improved functional abilities with decreased pain  and improved stability 02/28/17   Time 4   Period Weeks   Status On-going     PT LONG TERM GOAL #4   Title I in HEP 02/28/17   Time 4   Period Weeks   Status On-going     PT LONG TERM GOAL #5   Title Improve FOTO to </= 36% limitation 02/28/17   Time 4   Period Weeks   Status On-going               Plan - 02/08/17 1034    Clinical  Impression Statement Pt will benefit from further education on proper positioning to improve core stabilization and to understand the importance of performing core exercises in order to strengthen lumbar area, increase ROM, and improve trunk flexibility. Patient shows weakness or discomfort in other areas of the body that limit his ability to perform some exercises; therefore all exercises have been modified to be performed within pt's capabilities.   Clinical Presentation Stable   Rehab Potential Good   PT Frequency 1x / week   PT Duration 4 weeks   PT Treatment/Interventions Patient/family education;ADLs/Self Care Home Management;Cryotherapy;Electrical Stimulation;Iontophoresis 65m/ml Dexamethasone;Moist Heat;Ultrasound;Dry needling;Manual techniques;Therapeutic activities;Therapeutic exercise   PT Next Visit Plan Continue to review exercises and emphasize importance of core stabilization.   Consulted and Agree with Plan of Care Patient      Patient will benefit from skilled therapeutic intervention in order to improve the following deficits and impairments:  Postural dysfunction, Improper body mechanics, Increased muscle spasms, Decreased strength, Decreased activity tolerance  Visit Diagnosis: Acute bilateral low back pain without sciatica  Other symptoms and signs involving the musculoskeletal system  Muscle weakness (generalized)     Problem List Patient Active Problem List   Diagnosis Date Noted  . Lumbar stenosis with neurogenic claudication 10/30/2016  . 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 Status post revision of total replacement of left knee 09/20/2015  . Infection of total left knee replacement ([place] 08/06/2015  . Effusion of left knee joint; questionable prosthetic joint infection 05/07/2015  . Knee effusion 05/07/2015  . Status post total left knee replacement 01/08/2015  . Osteoarthritis of left knee 12/25/2014  . Lumbar spondylosis L2-5 09/14/2012    [name] 02/08/17  11:42am  [facility] [place] [address] [address]Phone: [phone]  Fax:  [phone] Name: AAadarsh CozortMRN: [id number] of Birth: 515-Jul-1957 During this treatment session, the therapist was present, participating in and directing the treatment. [name] PTA 02/08/17 11:51 AM  PHYSICAL THERAPY DISCHARGE SUMMARY  Visits from Start of Care: 2  Current functional level related to goals / functional outcomes: See progress note for discharge status   Remaining deficits: Should continue with HEP to maximize benefit from PT    Education / Equipment: HEP Plan: Patient agrees to discharge.  Patient goals were partially met. Patient is being discharged due to being pleased with the current functional level.  ?????      Celyn P. HHelene KelpPT, MPH 03/22/17 12:48 PM

## 2017-02-15 ENCOUNTER — Encounter: Payer: Medicare HMO | Admitting: Physical Therapy

## 2017-04-04 NOTE — Addendum Note (Signed)
Addendum  created 04/04/17 1237 by Achille Rich, MD   Sign clinical note

## 2017-05-14 ENCOUNTER — Other Ambulatory Visit (INDEPENDENT_AMBULATORY_CARE_PROVIDER_SITE_OTHER): Payer: Self-pay

## 2017-05-14 ENCOUNTER — Ambulatory Visit (INDEPENDENT_AMBULATORY_CARE_PROVIDER_SITE_OTHER): Payer: Medicare HMO | Admitting: Physician Assistant

## 2017-05-14 ENCOUNTER — Encounter (INDEPENDENT_AMBULATORY_CARE_PROVIDER_SITE_OTHER): Payer: Self-pay | Admitting: Physician Assistant

## 2017-05-14 ENCOUNTER — Ambulatory Visit (INDEPENDENT_AMBULATORY_CARE_PROVIDER_SITE_OTHER): Payer: Medicare HMO

## 2017-05-14 VITALS — Ht 67.0 in | Wt 168.0 lb

## 2017-05-14 DIAGNOSIS — M25462 Effusion, left knee: Secondary | ICD-10-CM

## 2017-05-14 DIAGNOSIS — M25562 Pain in left knee: Secondary | ICD-10-CM | POA: Diagnosis not present

## 2017-05-14 DIAGNOSIS — Z96652 Presence of left artificial knee joint: Secondary | ICD-10-CM | POA: Diagnosis not present

## 2017-05-14 NOTE — Progress Notes (Signed)
Office Visit Note   Patient: [name]           Date of Birth: August 07, 1956           MRN: [id number] Visit Date: 05/14/2017              Requested by: Rich Fuchs, PA [address] [place], [place] [address] PCP: Rich Fuchs, PA   Assessment & Plan: Visit Diagnoses:  1. Acute pain of left knee   2. Status post revision of total replacement of left knee     Plan: We will obtain a Bone Scan to rule out loosening of the left total knee arthroplasty components. CBC, SED rate , and CRP. Follow-up in 2 weeks sooner if he develops any redness ,fevers ,chills or signs of infection.  Follow-Up Instructions: Return in about 2 weeks (around 05/28/2017).   Orders:  Orders Placed This Encounter  Procedures  . XR Knee 1-2 Views Left  . CBC with Differential  . Sed Rate (ESR)  . C-reactive protein   No orders of the defined types were placed in this encounter.     Procedures: No procedures performed   Clinical Data: No additional findings.   Subjective: Chief Complaint  Patient presents with  . Left Leg - Pain    HPI Mr. Falls 61 year old male well-known Dr. Trevor Mace service with a history of initial left total knee arthroplasty May 2016 2 subsequent irrigation and debridement and then an excision of the left total knee December 2016 with reimplantation of left knee components February 2017. States his left leg knee are sore to the touch pains down even into his left ankle area. States the knee feels as if it gives at times. He's had no new injury to the knee. Denies any fevers chills shortness breath. No significant calf pain. Pains and swelling is been ongoing for the last week. Is tried ibuprofen. Review of Systems Denies fevers chills shortness of breath or chest pain. Positive swelling left leg laterally down the ankle. No numbness tingling down left leg.  Objective: Vital Signs: Ht _0  (1.702 m)   Wt 168 lb (76.2 kg)   BMI 26.31 kg/m   Physical  Exam  Constitutional: He is oriented to person, place, and time. He appears well-developed and well-nourished. No distress.  Neurological: He is alert and oriented to person, place, and time.  Skin: He is not diaphoretic.  Psychiatric: He has a normal mood and affect.    Ortho Exam Left knee positive edema and no significant effusion. No abnormal warmth. Full extension to slight hyperextension. Full flexion. No gross instability valgus varus stressing. Anterior drawer reveals some laxity. Tenderness over the ankle laterally. No bruising. Specialty Comments:  No specialty comments available.  Imaging: Xr Knee 1-2 Views Left  Result Date: 05/14/2017 AP lateral views left knee: AP view shows no hardware failure no acute fractures. Lateral view suspicious for loosening of femoral component and patellar component.    PMFS History: Patient Active Problem List   Diagnosis Date Noted  . Lumbar stenosis with neurogenic claudication 10/30/2016  . 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 Status post revision of total replacement of left knee 09/20/2015  . Infection of total left knee replacement ([place]) 08/06/2015  . Effusion of left knee joint; questionable prosthetic joint infection 05/07/2015  . Knee effusion 05/07/2015  . Status post total left knee replacement 01/08/2015  .  Osteoarthritis of left knee 12/25/2014  . Lumbar spondylosis L2-5 09/14/2012   Past Medical History:  Diagnosis Date  . ADD (attention deficit disorder)    takes Ritalin daily  . Anemia    denies  . Anxiety   . Arthritis   . Cataract    left eye  . Depression    takes Cymbalta daily  . Enlarged prostate    takes Flomax daily   . GERD (gastroesophageal reflux disease)    takes Omeprazole daily  . Headache    hx of migraines-none recent  . History of blood transfusion   . History of kidney stones    x1 15  years ago  . Hypertension   . Numbness and tingling    fingers bilat comes and goes   . OCD (obsessive compulsive disorder)   . Restless leg syndrome    takes requip daily  . Rheumatoid arthritis(714.0)    sees Dr Gerilyn Nestle @ [facility] in [place]  . Wears glasses     Family History  Problem Relation Age of Onset  . CAD Father   . Alzheimer's disease Father     Past Surgical History:  Procedure Laterality Date  . ABDOMINAL EXPOSURE  09/13/2012   Procedure: ABDOMINAL EXPOSURE;  Surgeon: Rosetta Posner, MD;  [facility]: [facility];  Service: Vascular;  Laterality: N/A;  . ANTERIOR LAT LUMBAR FUSION  09/13/2012   Procedure: ANTERIOR LATERAL LUMBAR FUSION 2 LEVELS;  Surgeon: Faythe Ghee, MD;  Location: [place] NE[facility] OR[facility];  Service: Neurosurgery;  Laterality: Right;  lumbar two three,three-four  . ANTERIOR LUMBAR FUSION  09/13/2012   Procedure: ANTERIOR LUMBAR FUSION 1 LEVEL;  Surgeon: Faythe Ghee, MD;  Location: [place] NEURO ORS;  Service: Neurosurgery;  Laterality: N/A;  lumbar four-five  . EXCISIONAL TOTAL KNEE ARTHROPLASTY WITH ANTIBIOTIC SPACERS Left 08/06/2015   Procedure: EXCISION OF LEFT TOTAL KNEE ARTHROPLASTY WITH PLACEMENT OF ANTIBIOTIC SPACERS;  Surgeon: Mcarthur Rossetti, MD;  [facility];  Service: Orthopedics;  Laterality: Left;  . HAND TENDON SURGERY Right 06/2012   done @ [facility] in [place]  . I&D KNEE WITH POLY EXCHANGE Left 05/07/2015   Procedure: IRRIGATION AND DEBRIDEMENT LEFT KNEE WITH POLY EXCHANGE;  Surgeon: Mcarthur Rossetti, MD;  [facility];  Service: Orthopedics;  Laterality: Left;  . KNEE ARTHROSCOPY Left 06/22/2015   Procedure: LEFT KNEE ARTHROSCOPY WITH DEBRIDEMENT, SYNOVECTOMY;  Surgeon: Mcarthur Rossetti, MD;  Location: [facility];  Service: Orthopedics;  Laterality: Left;  . LUMBAR PERCUTANEOUS PEDICLE SCREW 2 LEVEL  09/13/2012   Procedure: LUMBAR PERCUTANEOUS PEDICLE SCREW 2 LEVEL;  Surgeon: Faythe Ghee, MD;  Location: [facility] NEURO ORS;  Service:  Neurosurgery;  Laterality: Left;  left pedicle screws two -three,three-four  . ROTATOR CUFF REPAIR Right 2008    done in [facility]  . SHOULDER ARTHROSCOPY Right 10/02/2016  . TOTAL HIP ARTHROPLASTY Right 01/2012   done in [facility]  . TOTAL KNEE ARTHROPLASTY Left 01/08/2015   Procedure: LEFT TOTAL KNEE ARTHROPLASTY;  Surgeon: Mcarthur Rossetti, MD;  [facility];  Service: Orthopedics;  Laterality: Left;  . TOTAL KNEE REVISION Left 09/20/2015   Procedure: RE-IMPLANT/REVISION LEFT TOTAL KNEE ARTHROPLASTY;  Surgeon: Mcarthur Rossetti, MD;  Location: [facility];  Service: Orthopedics;  Laterality: Left;   Social History   Occupational History  . Not on file.   Social History Main Topics  . Smoking status: Never Smoker  . Smokeless tobacco: Never Used  . Alcohol use Yes  Comment: rarely   . Drug use: No  . Sexual activity: Not on file

## 2017-05-15 LAB — CBC WITH DIFFERENTIAL/PLATELET
BASOS ABS: 17 {cells}/uL (ref 0–200)
Basophils Relative: 0.3 %
Eosinophils Absolute: 70 cells/uL (ref 15–500)
Eosinophils Relative: 1.2 %
HEMATOCRIT: 33.2 % — AB (ref 38.5–50.0)
Hemoglobin: 10.9 g/dL — ABNORMAL LOW (ref 13.2–17.1)
LYMPHS ABS: 1073 {cells}/uL (ref 850–3900)
MCH: 28.6 pg (ref 27.0–33.0)
MCHC: 32.8 g/dL (ref 32.0–36.0)
MCV: 87.1 fL (ref 80.0–100.0)
MPV: 10 fL (ref 7.5–12.5)
Monocytes Relative: 6.7 %
NEUTROS PCT: 73.3 %
Neutro Abs: 4251 cells/uL (ref 1500–7800)
Platelets: 217 10*3/uL (ref 140–400)
RBC: 3.81 10*6/uL — ABNORMAL LOW (ref 4.20–5.80)
RDW: 16.9 % — AB (ref 11.0–15.0)
Total Lymphocyte: 18.5 %
WBC: 5.8 10*3/uL (ref 3.8–10.8)
WBCMIX: 389 {cells}/uL (ref 200–950)

## 2017-05-15 LAB — SEDIMENTATION RATE: Sed Rate: 9 mm/h (ref 0–20)

## 2017-05-15 LAB — C-REACTIVE PROTEIN: CRP: 2 mg/L (ref <8.0)

## 2017-05-16 ENCOUNTER — Ambulatory Visit (INDEPENDENT_AMBULATORY_CARE_PROVIDER_SITE_OTHER): Payer: Medicare HMO | Admitting: Orthopaedic Surgery

## 2017-05-18 ENCOUNTER — Telehealth (INDEPENDENT_AMBULATORY_CARE_PROVIDER_SITE_OTHER): Payer: Self-pay | Admitting: Orthopaedic Surgery

## 2017-05-18 NOTE — Telephone Encounter (Signed)
Patient wanting lab results. Please advise. Thanks.

## 2017-05-18 NOTE — Telephone Encounter (Signed)
Patient called asking about the results of his blood work. If you could give him a call if the results are in. Thank you. Cb # [phone]

## 2017-05-25 ENCOUNTER — Encounter (HOSPITAL_COMMUNITY)
Admission: RE | Admit: 2017-05-25 | Discharge: 2017-05-25 | Disposition: A | Discharge status: Home / Self Care | Payer: Medicare HMO | Source: Ambulatory Visit | Attending: Physician Assistant | Admitting: Physician Assistant

## 2017-05-25 ENCOUNTER — Encounter (HOSPITAL_COMMUNITY): Admission: RE | Admit: 2017-05-25 | Payer: Medicare HMO | Source: Ambulatory Visit

## 2017-05-25 DIAGNOSIS — M25462 Effusion, left knee: Secondary | ICD-10-CM | POA: Insufficient documentation

## 2017-05-25 DIAGNOSIS — M25562 Pain in left knee: Secondary | ICD-10-CM | POA: Diagnosis present

## 2017-05-25 MED ORDER — TECHNETIUM TC 99M MEDRONATE IV KIT
25.0000 | PACK | Freq: Once | INTRAVENOUS | Status: AC | PRN
  Administered 2017-05-25: 25 via INTRAVENOUS

## 2017-05-28 ENCOUNTER — Ambulatory Visit (INDEPENDENT_AMBULATORY_CARE_PROVIDER_SITE_OTHER): Payer: Medicare HMO | Admitting: Orthopaedic Surgery

## 2017-05-28 DIAGNOSIS — M25462 Effusion, left knee: Secondary | ICD-10-CM

## 2017-05-28 NOTE — Addendum Note (Signed)
Addended by: Shonna Chock on: 05/28/2017 03:19 PM   Modules accepted: Orders

## 2017-05-28 NOTE — Progress Notes (Signed)
The patient is continue to follow-up for his left knee. We obtained a peripheral white blood cell count, a sedimentation rate, CRP. Only the CRP which is slightly elevated. He still having pain and swelling of that knee. Was sent him also for a 3 phase bone scan to assess for prosthetic loosening.  On examination of the left knee there is no redness but there is an effusion. I was able to aspirate at least 50 mL of fluid off the knee which we are going to send for Gram stain, cell count, and culture. The knee fluid was serosanguineous. He did get relief after this.  The 3 phase bone scan does suggest prosthetic loosening and synovitis.  He understands I'm worried that this may be an infection but definitely shows evidence of loosening components that would require revision arthroplasty. He understands this fully and we will wait to see what the aspiration shows. He has not been on antibiotics either. I will call the results of aspiration and over the phone and we can decide what the next capsule be.

## 2017-05-29 LAB — TIQ-NTM

## 2017-05-29 LAB — GRAM STAIN
MICRO NUMBER:: 81146523
SPECIMEN QUALITY:: ADEQUATE

## 2017-05-29 LAB — SYNOVIAL CELL COUNT + DIFF, W/ CRYSTALS: WBC, Synovial: 1035 cells/uL — ABNORMAL HIGH (ref <150)

## 2017-07-09 ENCOUNTER — Telehealth (INDEPENDENT_AMBULATORY_CARE_PROVIDER_SITE_OTHER): Payer: Self-pay | Admitting: Orthopaedic Surgery

## 2017-07-09 NOTE — Telephone Encounter (Signed)
Patient wants to discuss with Dr Magnus Ivan the results from his second opinion with Dr Samuel Bouche. He states he going to try a brace per Dr Samuel Bouche.

## 2017-07-13 ENCOUNTER — Telehealth (INDEPENDENT_AMBULATORY_CARE_PROVIDER_SITE_OTHER): Payer: Self-pay | Admitting: Orthopaedic Surgery

## 2017-07-13 NOTE — Telephone Encounter (Signed)
Patient called wanting to know if he wanted 6 months to do the revision, would that make things worse or would it be okay.  CB#[phone].  Thank you.

## 2017-07-13 NOTE — Telephone Encounter (Signed)
Please advise 

## 2017-07-16 NOTE — Telephone Encounter (Signed)
There is no problem waiting this long as long as he is doing ok.

## 2017-07-16 NOTE — Telephone Encounter (Signed)
Patient aware of the below message  

## 2017-07-30 ENCOUNTER — Ambulatory Visit (INDEPENDENT_AMBULATORY_CARE_PROVIDER_SITE_OTHER): Payer: Medicare HMO | Admitting: Orthopaedic Surgery

## 2017-09-03 ENCOUNTER — Telehealth (INDEPENDENT_AMBULATORY_CARE_PROVIDER_SITE_OTHER): Payer: Self-pay | Admitting: Orthopaedic Surgery

## 2017-09-03 NOTE — Telephone Encounter (Signed)
Patient states it is time for him to have knee surgery, he said him and Dr. Magnus Ivan have discussed. Not sure if you need anything before he is schedule. Please advise # [phone]

## 2017-09-03 NOTE — Telephone Encounter (Signed)
Please advise 

## 2017-10-02 NOTE — Telephone Encounter (Signed)
I called patient to discuss.  He will call me back to schedule.   Wife might need surgery and will have to coordinate.

## 2017-10-09 ENCOUNTER — Other Ambulatory Visit (INDEPENDENT_AMBULATORY_CARE_PROVIDER_SITE_OTHER): Payer: Self-pay | Admitting: Orthopaedic Surgery

## 2017-10-09 DIAGNOSIS — T84498D Other mechanical complication of other internal orthopedic devices, implants and grafts, subsequent encounter: Secondary | ICD-10-CM

## 2017-10-09 NOTE — Pre-Procedure Instructions (Signed)
[name]  10/09/2017      [id number] - [place], [place] - [address]OSS [address] [place] [address] Phone: [phone] Fax: [phone]  [facility] - [place], [place] - [address] [place] [address] Phone: [phone] Fax: [phone]    Your procedure is scheduled on Tuesday, October 16, 2017  Report to [facility] Admitting Entrance "A" at 11:00AM   Call this number if you have problems the morning of surgery:  [phone]   Remember:  Do not eat food or drink liquids after midnight.  Take these medicines the morning of surgery with A SIP OF WATER: DULoxetine (CYMBALTA), Pantoprazole (PROTONIX), Tamsulosin (FLOMAX), and  Pramipexole (MIRAPEX). If needed Acetaminophen (TYLENOL) for pain.  As of today, stop taking all Aspirins, Vitamins, Fish oils, and Herbal medications. Also stop all NSAIDS i.e. Advil, Ibuprofen, Motrin, Aleve, Anaprox, Naproxen, BC and Goody Powders. Including: Diclofenac (VOLTAREN)   Do not wear jewelry.  Do not wear lotions, powders, colognes, or deodorant.  Do not shave 48 hours prior to surgery.  Men may shave face and neck.  Do not bring valuables to the hospital.  [facility] is not responsible for any belongings or valuables.  Contacts, dentures or bridgework may not be worn into surgery.  Leave your suitcase in the car.  After surgery it may be brought to your room.  For patients admitted to the hospital, discharge time will be determined by your treatment team.  Patients discharged the day of surgery will not be allowed to drive home.   Special instructions: [facility]- Preparing For Surgery  Before surgery, you can play an important role. Because skin is not sterile, your skin needs to be as free of germs as possible. You can reduce the number of germs on your skin by washing with CHG (chlorahexidine gluconate) Soap  before surgery.  CHG is an antiseptic cleaner which kills germs and bonds with the skin to continue killing germs even after washing.  Please do not use if you have an allergy to CHG or antibacterial soaps. If your skin becomes reddened/irritated stop using the CHG.  Do not shave (including legs and underarms) for at least 48 hours prior to first CHG shower. It is OK to shave your face.  Please follow these instructions carefully.   1. Shower the NIGHT BEFORE SURGERY and the MORNING OF SURGERY with CHG.   2. If you chose to wash your hair, wash your hair first as usual with your normal shampoo.  3. After you shampoo, rinse your hair and body thoroughly to remove the shampoo.  4. Use CHG as you would any other liquid soap. You can apply CHG directly to the skin and wash gently with a scrungie or a clean washcloth.   5. Apply the CHG Soap to your body ONLY FROM THE NECK DOWN.  Do not use on open wounds or open sores. Avoid contact with your eyes, ears, mouth and genitals (private parts). Wash Face and genitals (private parts)  with your normal soap.  6. Wash thoroughly, paying special attention to the area where your surgery will be performed.  7. Thoroughly rinse your body with warm water from the neck down.  8. DO NOT shower/wash with your normal soap after using and rinsing off the CHG Soap.  9. Pat yourself dry with a CLEAN TOWEL.  10. Wear CLEAN PAJAMAS to  bed the night before surgery, wear comfortable clothes the morning of surgery  11. Place CLEAN SHEETS on your bed the night of your first shower and DO NOT SLEEP WITH PETS.  Day of Surgery: Do not apply any deodorants/lotions. Please wear clean clothes to the hospital/surgery center.    Please read over the following fact sheets that you were given. Pain Booklet, Coughing and Deep Breathing, MRSA Information and Surgical Site Infection Prevention

## 2017-10-10 ENCOUNTER — Encounter (INDEPENDENT_AMBULATORY_CARE_PROVIDER_SITE_OTHER): Payer: Self-pay

## 2017-10-10 ENCOUNTER — Other Ambulatory Visit: Payer: Self-pay

## 2017-10-10 ENCOUNTER — Telehealth (INDEPENDENT_AMBULATORY_CARE_PROVIDER_SITE_OTHER): Payer: Self-pay | Admitting: Orthopaedic Surgery

## 2017-10-10 ENCOUNTER — Encounter (HOSPITAL_COMMUNITY)
Admission: RE | Admit: 2017-10-10 | Discharge: 2017-10-10 | Disposition: A | Discharge status: Home / Self Care | Payer: Medicare HMO | Source: Ambulatory Visit | Attending: Orthopaedic Surgery | Admitting: Orthopaedic Surgery

## 2017-10-10 ENCOUNTER — Encounter (HOSPITAL_COMMUNITY): Payer: Self-pay

## 2017-10-10 DIAGNOSIS — Z01810 Encounter for preprocedural cardiovascular examination: Secondary | ICD-10-CM | POA: Diagnosis present

## 2017-10-10 DIAGNOSIS — X58XXXA Exposure to other specified factors, initial encounter: Secondary | ICD-10-CM | POA: Insufficient documentation

## 2017-10-10 DIAGNOSIS — Z01812 Encounter for preprocedural laboratory examination: Secondary | ICD-10-CM | POA: Diagnosis present

## 2017-10-10 DIAGNOSIS — T84093A Other mechanical complication of internal left knee prosthesis, initial encounter: Secondary | ICD-10-CM | POA: Insufficient documentation

## 2017-10-10 LAB — CBC
HCT: 39.8 % (ref 39.0–52.0)
HEMOGLOBIN: 13.6 g/dL (ref 13.0–17.0)
MCH: 30.4 pg (ref 26.0–34.0)
MCHC: 34.2 g/dL (ref 30.0–36.0)
MCV: 89 fL (ref 78.0–100.0)
Platelets: 202 10*3/uL (ref 150–400)
RBC: 4.47 MIL/uL (ref 4.22–5.81)
RDW: 14 % (ref 11.5–15.5)
WBC: 5.5 10*3/uL (ref 4.0–10.5)

## 2017-10-10 LAB — TYPE AND SCREEN
ABO/RH(D): A POS
Antibody Screen: NEGATIVE

## 2017-10-10 LAB — BASIC METABOLIC PANEL
ANION GAP: 9 (ref 5–15)
BUN: 22 mg/dL — ABNORMAL HIGH (ref 6–20)
CALCIUM: 9.2 mg/dL (ref 8.9–10.3)
CO2: 26 mmol/L (ref 22–32)
Chloride: 101 mmol/L (ref 101–111)
Creatinine, Ser: 0.87 mg/dL (ref 0.61–1.24)
GLUCOSE: 98 mg/dL (ref 65–99)
Potassium: 4.4 mmol/L (ref 3.5–5.1)
SODIUM: 136 mmol/L (ref 135–145)

## 2017-10-10 LAB — SURGICAL PCR SCREEN
MRSA, PCR: NEGATIVE
STAPHYLOCOCCUS AUREUS: NEGATIVE

## 2017-10-10 NOTE — Progress Notes (Signed)
Pt. Encouraged to call MD to inquire about whether a dose of the Methotrexate should be held this weekend. Pt. Agrees.

## 2017-10-10 NOTE — Telephone Encounter (Signed)
Patient came into the clinic and requested a note to be faxed to his employer [employer]) attention Rosalva Ferron at fax #[phone] stating that he is having surgery on March 5th.  Ttelephone number is [phone] if you have any questions.  Patient's CB#[phone].  Thank you.

## 2017-10-10 NOTE — Telephone Encounter (Signed)
Faxed to provided number  

## 2017-10-10 NOTE — Progress Notes (Signed)
Pt. Denies chest or breathing problems, PCP- Dr. Earnest Bailey, has seen cardiac care  in the past- stress test was wnl. Pulse increased at PAT check, pt. Verbalizing that he is very anxious about this surgery, has seeked a second opinion & is quite nervous related to the outcome.  Pulse improved on 3 rd palpation .

## 2017-10-12 ENCOUNTER — Telehealth (INDEPENDENT_AMBULATORY_CARE_PROVIDER_SITE_OTHER): Payer: Self-pay | Admitting: Orthopaedic Surgery

## 2017-10-12 ENCOUNTER — Encounter (INDEPENDENT_AMBULATORY_CARE_PROVIDER_SITE_OTHER): Payer: Self-pay

## 2017-10-12 NOTE — Telephone Encounter (Signed)
Faxed to provided number  

## 2017-10-12 NOTE — Telephone Encounter (Signed)
Patient called asking if a note could be faxed to his employer stating that he was taken out of work on 2/25 prior to his knee replacement surgery scheduled for this coming Tuesday. Fax # [phone] Attn: Rosalva Ferron CB # [phone]

## 2017-10-15 MED ORDER — TRANEXAMIC ACID 1000 MG/10ML IV SOLN
1000.0000 mg | INTRAVENOUS | Status: AC
  Administered 2017-10-16: 1000 mg via INTRAVENOUS
  Filled 2017-10-15: qty 1100

## 2017-10-15 MED ORDER — CLINDAMYCIN PHOSPHATE 900 MG/50ML IV SOLN
900.0000 mg | INTRAVENOUS | Status: AC
  Administered 2017-10-16: 900 mg via INTRAVENOUS
  Filled 2017-10-15: qty 50

## 2017-10-16 ENCOUNTER — Inpatient Hospital Stay (HOSPITAL_COMMUNITY)
Admission: RE | Admit: 2017-10-16 | Discharge: 2017-10-18 | DRG: 467 | Disposition: A | Discharge status: Home / Self Care | Payer: Medicare HMO | Source: Ambulatory Visit | Attending: Orthopaedic Surgery | Admitting: Orthopaedic Surgery

## 2017-10-16 ENCOUNTER — Encounter (HOSPITAL_COMMUNITY)
Admission: RE | Disposition: A | Discharge status: Home / Self Care | Payer: Self-pay | Source: Ambulatory Visit | Attending: Orthopaedic Surgery

## 2017-10-16 ENCOUNTER — Inpatient Hospital Stay (HOSPITAL_COMMUNITY): Payer: Medicare HMO

## 2017-10-16 ENCOUNTER — Inpatient Hospital Stay (HOSPITAL_COMMUNITY): Payer: Medicare HMO | Admitting: Anesthesiology

## 2017-10-16 ENCOUNTER — Encounter (HOSPITAL_COMMUNITY): Payer: Self-pay | Admitting: *Deleted

## 2017-10-16 DIAGNOSIS — F419 Anxiety disorder, unspecified: Secondary | ICD-10-CM | POA: Diagnosis present

## 2017-10-16 DIAGNOSIS — Z981 Arthrodesis status: Secondary | ICD-10-CM

## 2017-10-16 DIAGNOSIS — Z96611 Presence of right artificial shoulder joint: Secondary | ICD-10-CM | POA: Diagnosis present

## 2017-10-16 DIAGNOSIS — Z880 Allergy status to penicillin: Secondary | ICD-10-CM | POA: Diagnosis not present

## 2017-10-16 DIAGNOSIS — T84033S Mechanical loosening of internal left knee prosthetic joint, sequela: Secondary | ICD-10-CM

## 2017-10-16 DIAGNOSIS — F329 Major depressive disorder, single episode, unspecified: Secondary | ICD-10-CM | POA: Diagnosis present

## 2017-10-16 DIAGNOSIS — T84498D Other mechanical complication of other internal orthopedic devices, implants and grafts, subsequent encounter: Secondary | ICD-10-CM

## 2017-10-16 DIAGNOSIS — Z820 Family history of epilepsy and other diseases of the nervous system: Secondary | ICD-10-CM | POA: Diagnosis not present

## 2017-10-16 DIAGNOSIS — Z87442 Personal history of urinary calculi: Secondary | ICD-10-CM | POA: Diagnosis not present

## 2017-10-16 DIAGNOSIS — K219 Gastro-esophageal reflux disease without esophagitis: Secondary | ICD-10-CM | POA: Diagnosis present

## 2017-10-16 DIAGNOSIS — Z79899 Other long term (current) drug therapy: Secondary | ICD-10-CM | POA: Diagnosis not present

## 2017-10-16 DIAGNOSIS — F988 Other specified behavioral and emotional disorders with onset usually occurring in childhood and adolescence: Secondary | ICD-10-CM | POA: Diagnosis present

## 2017-10-16 DIAGNOSIS — T84033A Mechanical loosening of internal left knee prosthetic joint, initial encounter: Secondary | ICD-10-CM | POA: Diagnosis present

## 2017-10-16 DIAGNOSIS — Y792 Prosthetic and other implants, materials and accessory orthopedic devices associated with adverse incidents: Secondary | ICD-10-CM | POA: Diagnosis present

## 2017-10-16 DIAGNOSIS — M25462 Effusion, left knee: Secondary | ICD-10-CM | POA: Diagnosis present

## 2017-10-16 DIAGNOSIS — Z96652 Presence of left artificial knee joint: Secondary | ICD-10-CM

## 2017-10-16 DIAGNOSIS — N400 Enlarged prostate without lower urinary tract symptoms: Secondary | ICD-10-CM | POA: Diagnosis present

## 2017-10-16 DIAGNOSIS — F429 Obsessive-compulsive disorder, unspecified: Secondary | ICD-10-CM | POA: Diagnosis present

## 2017-10-16 DIAGNOSIS — H269 Unspecified cataract: Secondary | ICD-10-CM | POA: Diagnosis present

## 2017-10-16 DIAGNOSIS — G2581 Restless legs syndrome: Secondary | ICD-10-CM | POA: Diagnosis present

## 2017-10-16 DIAGNOSIS — I10 Essential (primary) hypertension: Secondary | ICD-10-CM | POA: Diagnosis present

## 2017-10-16 DIAGNOSIS — Z96641 Presence of right artificial hip joint: Secondary | ICD-10-CM | POA: Diagnosis present

## 2017-10-16 DIAGNOSIS — D62 Acute posthemorrhagic anemia: Secondary | ICD-10-CM | POA: Diagnosis not present

## 2017-10-16 DIAGNOSIS — M069 Rheumatoid arthritis, unspecified: Secondary | ICD-10-CM | POA: Diagnosis present

## 2017-10-16 DIAGNOSIS — T84038A Mechanical loosening of other internal prosthetic joint, initial encounter: Secondary | ICD-10-CM

## 2017-10-16 DIAGNOSIS — Z8249 Family history of ischemic heart disease and other diseases of the circulatory system: Secondary | ICD-10-CM

## 2017-10-16 DIAGNOSIS — Z96659 Presence of unspecified artificial knee joint: Secondary | ICD-10-CM

## 2017-10-16 HISTORY — PX: TOTAL KNEE REVISION: SHX996

## 2017-10-16 HISTORY — PX: STERIOD INJECTION: SHX5046

## 2017-10-16 SURGERY — STEROID INJECTION
Anesthesia: Monitor Anesthesia Care | Site: Shoulder | Laterality: Left

## 2017-10-16 MED ORDER — SENNOSIDES-DOCUSATE SODIUM 8.6-50 MG PO TABS
1.0000 | ORAL_TABLET | Freq: Every evening | ORAL | Status: DC | PRN
  Filled 2017-10-16: qty 1

## 2017-10-16 MED ORDER — ONDANSETRON HCL 4 MG/2ML IJ SOLN: 4.0000 mg | Freq: Four times a day (QID) | INTRAMUSCULAR | Status: DC | PRN

## 2017-10-16 MED ORDER — MIDAZOLAM HCL 2 MG/2ML IJ SOLN
1.0000 mg | Freq: Once | INTRAMUSCULAR | Status: AC
  Administered 2017-10-16: 1 mg via INTRAVENOUS

## 2017-10-16 MED ORDER — ONDANSETRON HCL 4 MG/2ML IJ SOLN
INTRAMUSCULAR | Status: AC
  Filled 2017-10-16: qty 2

## 2017-10-16 MED ORDER — FENTANYL CITRATE (PF) 100 MCG/2ML IJ SOLN: 25.0000 ug | INTRAMUSCULAR | Status: DC | PRN

## 2017-10-16 MED ORDER — CLINDAMYCIN PHOSPHATE 600 MG/50ML IV SOLN
600.0000 mg | Freq: Four times a day (QID) | INTRAVENOUS | Status: AC
  Administered 2017-10-16 – 2017-10-17 (×2): 600 mg via INTRAVENOUS
  Filled 2017-10-16 (×2): qty 50

## 2017-10-16 MED ORDER — SODIUM CHLORIDE 0.9 % IR SOLN
Status: DC | PRN
  Administered 2017-10-16 (×2): 3000 mL

## 2017-10-16 MED ORDER — METOCLOPRAMIDE HCL 5 MG/ML IJ SOLN: 5.0000 mg | Freq: Three times a day (TID) | INTRAMUSCULAR | Status: DC | PRN

## 2017-10-16 MED ORDER — ONDANSETRON HCL 4 MG PO TABS: 4.0000 mg | ORAL_TABLET | Freq: Four times a day (QID) | ORAL | Status: DC | PRN

## 2017-10-16 MED ORDER — MIDAZOLAM HCL 2 MG/2ML IJ SOLN
INTRAMUSCULAR | Status: AC
  Administered 2017-10-16: 1 mg via INTRAVENOUS
  Filled 2017-10-16: qty 2

## 2017-10-16 MED ORDER — METHOCARBAMOL 500 MG PO TABS
500.0000 mg | ORAL_TABLET | Freq: Four times a day (QID) | ORAL | Status: DC | PRN
Start: 2017-10-16 — End: 2017-10-18
  Administered 2017-10-16 – 2017-10-18 (×6): 500 mg via ORAL
  Filled 2017-10-16 (×5): qty 1

## 2017-10-16 MED ORDER — HYDROCHLOROTHIAZIDE 12.5 MG PO CAPS
12.5000 mg | ORAL_CAPSULE | Freq: Every day | ORAL | Status: DC
  Administered 2017-10-17 – 2017-10-18 (×2): 12.5 mg via ORAL
  Filled 2017-10-16 (×2): qty 1

## 2017-10-16 MED ORDER — LIDOCAINE HCL (PF) 1 % IJ SOLN
INTRAMUSCULAR | Status: AC
  Filled 2017-10-16: qty 30

## 2017-10-16 MED ORDER — ACETAMINOPHEN 500 MG PO TABS
1000.0000 mg | ORAL_TABLET | Freq: Once | ORAL | Status: AC
  Administered 2017-10-16: 1000 mg via ORAL

## 2017-10-16 MED ORDER — DOCUSATE SODIUM 100 MG PO CAPS
100.0000 mg | ORAL_CAPSULE | Freq: Two times a day (BID) | ORAL | Status: DC
  Administered 2017-10-16 – 2017-10-18 (×4): 100 mg via ORAL
  Filled 2017-10-16 (×4): qty 1

## 2017-10-16 MED ORDER — STERILE WATER FOR IRRIGATION IR SOLN
Status: DC | PRN
  Administered 2017-10-16: 1000 mL

## 2017-10-16 MED ORDER — DIPHENHYDRAMINE HCL 12.5 MG/5ML PO ELIX: 12.5000 mg | ORAL_SOLUTION | ORAL | Status: DC | PRN

## 2017-10-16 MED ORDER — ACETAMINOPHEN 160 MG/5ML PO SOLN: 325.0000 mg | ORAL | Status: DC | PRN

## 2017-10-16 MED ORDER — FENTANYL CITRATE (PF) 100 MCG/2ML IJ SOLN
50.0000 ug | Freq: Once | INTRAMUSCULAR | Status: AC
  Administered 2017-10-16: 50 ug via INTRAVENOUS

## 2017-10-16 MED ORDER — OXYCODONE HCL 5 MG PO TABS
10.0000 mg | ORAL_TABLET | ORAL | Status: DC | PRN
  Administered 2017-10-17 – 2017-10-18 (×8): 15 mg via ORAL
  Filled 2017-10-16 (×8): qty 3

## 2017-10-16 MED ORDER — ACETAMINOPHEN 500 MG PO TABS
ORAL_TABLET | ORAL | Status: AC
  Filled 2017-10-16: qty 2

## 2017-10-16 MED ORDER — OXYCODONE HCL 5 MG PO TABS: 5.0000 mg | ORAL_TABLET | Freq: Once | ORAL | Status: DC | PRN

## 2017-10-16 MED ORDER — TAMSULOSIN HCL 0.4 MG PO CAPS
0.4000 mg | ORAL_CAPSULE | Freq: Every day | ORAL | Status: DC
  Administered 2017-10-17 – 2017-10-18 (×2): 0.4 mg via ORAL
  Filled 2017-10-16 (×2): qty 1

## 2017-10-16 MED ORDER — FENTANYL CITRATE (PF) 100 MCG/2ML IJ SOLN
INTRAMUSCULAR | Status: AC
  Administered 2017-10-16: 50 ug via INTRAVENOUS
  Filled 2017-10-16: qty 2

## 2017-10-16 MED ORDER — METHOCARBAMOL 1000 MG/10ML IJ SOLN
500.0000 mg | Freq: Four times a day (QID) | INTRAVENOUS | Status: DC | PRN
  Filled 2017-10-16: qty 5

## 2017-10-16 MED ORDER — FENTANYL CITRATE (PF) 250 MCG/5ML IJ SOLN
INTRAMUSCULAR | Status: AC
  Filled 2017-10-16: qty 5

## 2017-10-16 MED ORDER — METOCLOPRAMIDE HCL 5 MG PO TABS: 5.0000 mg | ORAL_TABLET | Freq: Three times a day (TID) | ORAL | Status: DC | PRN

## 2017-10-16 MED ORDER — MENTHOL 3 MG MT LOZG: 1.0000 | LOZENGE | OROMUCOSAL | Status: DC | PRN

## 2017-10-16 MED ORDER — METHYLPHENIDATE HCL 5 MG PO TABS
20.0000 mg | ORAL_TABLET | Freq: Two times a day (BID) | ORAL | Status: DC
  Administered 2017-10-17 – 2017-10-18 (×2): 20 mg via ORAL
  Filled 2017-10-16 (×2): qty 4

## 2017-10-16 MED ORDER — PROPOFOL 10 MG/ML IV BOLUS
INTRAVENOUS | Status: DC | PRN
  Administered 2017-10-16: 60 mg via INTRAVENOUS

## 2017-10-16 MED ORDER — FOLIC ACID 1 MG PO TABS
1.0000 mg | ORAL_TABLET | Freq: Every day | ORAL | Status: DC
  Administered 2017-10-16 – 2017-10-18 (×3): 1 mg via ORAL
  Filled 2017-10-16 (×3): qty 1

## 2017-10-16 MED ORDER — PROPOFOL 1000 MG/100ML IV EMUL
INTRAVENOUS | Status: AC
  Filled 2017-10-16: qty 100

## 2017-10-16 MED ORDER — ACETAMINOPHEN 325 MG PO TABS: 325.0000 mg | ORAL_TABLET | ORAL | Status: DC | PRN

## 2017-10-16 MED ORDER — ALUM & MAG HYDROXIDE-SIMETH 200-200-20 MG/5ML PO SUSP: 30.0000 mL | ORAL | Status: DC | PRN

## 2017-10-16 MED ORDER — HYDROMORPHONE HCL 1 MG/ML IJ SOLN
0.5000 mg | INTRAMUSCULAR | Status: DC | PRN
  Administered 2017-10-16 – 2017-10-18 (×8): 1 mg via INTRAVENOUS
  Filled 2017-10-16 (×8): qty 1

## 2017-10-16 MED ORDER — SODIUM CHLORIDE 0.9 % IR SOLN
Status: DC | PRN
  Administered 2017-10-16: 1000 mL

## 2017-10-16 MED ORDER — PHENOL 1.4 % MT LIQD: 1.0000 | OROMUCOSAL | Status: DC | PRN

## 2017-10-16 MED ORDER — LISINOPRIL-HYDROCHLOROTHIAZIDE 20-12.5 MG PO TABS: 1.0000 | ORAL_TABLET | Freq: Every day | ORAL | Status: DC

## 2017-10-16 MED ORDER — OXYCODONE HCL 5 MG PO TABS
5.0000 mg | ORAL_TABLET | ORAL | Status: DC | PRN
  Administered 2017-10-16: 10 mg via ORAL

## 2017-10-16 MED ORDER — ROCURONIUM BROMIDE 10 MG/ML (PF) SYRINGE
PREFILLED_SYRINGE | INTRAVENOUS | Status: AC
  Filled 2017-10-16: qty 5

## 2017-10-16 MED ORDER — DEXAMETHASONE SODIUM PHOSPHATE 10 MG/ML IJ SOLN
INTRAMUSCULAR | Status: AC
  Filled 2017-10-16: qty 1

## 2017-10-16 MED ORDER — LISINOPRIL 20 MG PO TABS
20.0000 mg | ORAL_TABLET | Freq: Every day | ORAL | Status: DC
  Administered 2017-10-17 – 2017-10-18 (×2): 20 mg via ORAL
  Filled 2017-10-16 (×2): qty 1

## 2017-10-16 MED ORDER — ALBUMIN HUMAN 5 % IV SOLN
INTRAVENOUS | Status: DC | PRN
  Administered 2017-10-16: 17:00:00 via INTRAVENOUS

## 2017-10-16 MED ORDER — LACTATED RINGERS IV SOLN
INTRAVENOUS | Status: DC
  Administered 2017-10-16 (×2): via INTRAVENOUS

## 2017-10-16 MED ORDER — SODIUM CHLORIDE 0.9 % IV SOLN
INTRAVENOUS | Status: DC
  Administered 2017-10-17: 06:00:00 via INTRAVENOUS

## 2017-10-16 MED ORDER — PROPOFOL 500 MG/50ML IV EMUL
INTRAVENOUS | Status: DC | PRN
  Administered 2017-10-16: 16:00:00 via INTRAVENOUS
  Administered 2017-10-16: 75 ug/kg/min via INTRAVENOUS

## 2017-10-16 MED ORDER — PROPOFOL 10 MG/ML IV BOLUS
INTRAVENOUS | Status: AC
  Filled 2017-10-16: qty 20

## 2017-10-16 MED ORDER — ATORVASTATIN CALCIUM 40 MG PO TABS
40.0000 mg | ORAL_TABLET | Freq: Every day | ORAL | Status: DC
  Administered 2017-10-16 – 2017-10-17 (×2): 40 mg via ORAL
  Filled 2017-10-16 (×2): qty 1

## 2017-10-16 MED ORDER — LIDOCAINE HCL (PF) 1 % IJ SOLN
INTRAMUSCULAR | Status: DC | PRN
  Administered 2017-10-16: 3 mL

## 2017-10-16 MED ORDER — PRAMIPEXOLE DIHYDROCHLORIDE 0.125 MG PO TABS
1.0000 mg | ORAL_TABLET | Freq: Three times a day (TID) | ORAL | Status: DC
  Administered 2017-10-16 – 2017-10-17 (×3): 1 mg via ORAL
  Filled 2017-10-16 (×3): qty 8

## 2017-10-16 MED ORDER — ACETAMINOPHEN 325 MG PO TABS
325.0000 mg | ORAL_TABLET | Freq: Four times a day (QID) | ORAL | Status: DC | PRN
  Administered 2017-10-17: 650 mg via ORAL
  Filled 2017-10-16: qty 2

## 2017-10-16 MED ORDER — DULOXETINE HCL 60 MG PO CPEP
60.0000 mg | ORAL_CAPSULE | Freq: Two times a day (BID) | ORAL | Status: DC
  Administered 2017-10-16 – 2017-10-18 (×4): 60 mg via ORAL
  Filled 2017-10-16 (×4): qty 1

## 2017-10-16 MED ORDER — DEXAMETHASONE SODIUM PHOSPHATE 10 MG/ML IJ SOLN
INTRAMUSCULAR | Status: DC | PRN
  Administered 2017-10-16: 10 mg via INTRAVENOUS

## 2017-10-16 MED ORDER — OXYCODONE HCL 5 MG/5ML PO SOLN: 5.0000 mg | Freq: Once | ORAL | Status: DC | PRN

## 2017-10-16 MED ORDER — ONDANSETRON HCL 4 MG/2ML IJ SOLN
INTRAMUSCULAR | Status: DC | PRN
  Administered 2017-10-16: 4 mg via INTRAVENOUS

## 2017-10-16 MED ORDER — ROPIVACAINE HCL 7.5 MG/ML IJ SOLN
INTRAMUSCULAR | Status: DC | PRN
  Administered 2017-10-16: 20 mL via PERINEURAL

## 2017-10-16 MED ORDER — METHOCARBAMOL 500 MG PO TABS
ORAL_TABLET | ORAL | Status: AC
  Administered 2017-10-17: 500 mg via ORAL
  Filled 2017-10-16: qty 1

## 2017-10-16 MED ORDER — LIDOCAINE 2% (20 MG/ML) 5 ML SYRINGE
INTRAMUSCULAR | Status: AC
  Filled 2017-10-16: qty 5

## 2017-10-16 MED ORDER — BUPIVACAINE HCL (PF) 0.5 % IJ SOLN
INTRAMUSCULAR | Status: DC | PRN
  Administered 2017-10-16: 3 mL via INTRATHECAL

## 2017-10-16 MED ORDER — PANTOPRAZOLE SODIUM 20 MG PO TBEC
20.0000 mg | DELAYED_RELEASE_TABLET | Freq: Every day | ORAL | Status: DC
  Administered 2017-10-16 – 2017-10-18 (×3): 20 mg via ORAL
  Filled 2017-10-16 (×3): qty 1

## 2017-10-16 MED ORDER — GABAPENTIN 100 MG PO CAPS
100.0000 mg | ORAL_CAPSULE | Freq: Three times a day (TID) | ORAL | Status: DC
  Administered 2017-10-16 – 2017-10-18 (×5): 100 mg via ORAL
  Filled 2017-10-16 (×5): qty 1

## 2017-10-16 MED ORDER — OXYCODONE HCL 5 MG PO TABS
ORAL_TABLET | ORAL | Status: AC
  Administered 2017-10-16: 10 mg via ORAL
  Filled 2017-10-16: qty 2

## 2017-10-16 MED ORDER — METHYLPREDNISOLONE ACETATE 40 MG/ML IJ SUSP
INTRAMUSCULAR | Status: DC | PRN
  Administered 2017-10-16: 40 mg

## 2017-10-16 MED ORDER — METHYLPREDNISOLONE ACETATE 40 MG/ML IJ SUSP
INTRAMUSCULAR | Status: AC
  Filled 2017-10-16: qty 1

## 2017-10-16 MED ORDER — RIVAROXABAN 10 MG PO TABS
10.0000 mg | ORAL_TABLET | Freq: Every day | ORAL | Status: DC
  Administered 2017-10-17 – 2017-10-18 (×2): 10 mg via ORAL
  Filled 2017-10-16 (×2): qty 1

## 2017-10-16 SURGICAL SUPPLY — 100 items
ADAPTER BOLT FEMORAL +2/-2 (Knees) ×2 IMPLANT
ADPR FEM +2/-2 OFST BOLT (Knees) ×2 IMPLANT
ADPR FEM 5D STRL KN PFC SGM (Orthopedic Implant) ×2 IMPLANT
AUG FEM SZ5 8 CMB POST STRL LF (Orthopedic Implant) ×2 IMPLANT
AUG FEM SZ5 8 STRL LF KN LT TI (Knees) ×4 IMPLANT
AUG TIB SZ5 10 REV STP WDG (Knees) ×4 IMPLANT
AUGMENT DISTAL LEFT 8 (Knees) IMPLANT
AUGMENT POST 5 8 (Orthopedic Implant) ×1 IMPLANT
AUGMENT POST 5 8MM (Orthopedic Implant) ×1 IMPLANT
BANDAGE ACE 4X5 VEL STRL LF (GAUZE/BANDAGES/DRESSINGS) ×3 IMPLANT
BANDAGE ACE 6X5 VEL STRL LF (GAUZE/BANDAGES/DRESSINGS) ×4 IMPLANT
BANDAGE ELASTIC 6 VELCRO ST LF (GAUZE/BANDAGES/DRESSINGS) ×2 IMPLANT
BANDAGE ESMARK 6X9 LF (GAUZE/BANDAGES/DRESSINGS) ×2 IMPLANT
BLADE CLIPPER SURG (BLADE) IMPLANT
BLADE SAG 18X100X1.27 (BLADE) ×4 IMPLANT
BLADE SAGITTAL 25.0X1.27X90 (BLADE) ×3 IMPLANT
BLADE SAGITTAL 25.0X1.27X90MM (BLADE) ×1
BLADE SAW SGTL 13.0X1.19X90.0M (BLADE) ×3 IMPLANT
BNDG CMPR 9X6 STRL LF SNTH (GAUZE/BANDAGES/DRESSINGS) ×2
BNDG COHESIVE 6X5 TAN STRL LF (GAUZE/BANDAGES/DRESSINGS) ×8 IMPLANT
BNDG ESMARK 6X9 LF (GAUZE/BANDAGES/DRESSINGS) ×4
BOWL SMART MIX CTS (DISPOSABLE) ×6 IMPLANT
CABLE CERLAGE W/CRIMP 1.8 (Cable) ×2 IMPLANT
CABLE CERLAGE W/CRIMP 1.8MM (Cable) ×2 IMPLANT
CEMENT BONE SIMPLEX SPEEDSET (Cement) ×12 IMPLANT
COVER SURGICAL LIGHT HANDLE (MISCELLANEOUS) ×4 IMPLANT
CUFF TOURNIQUET SINGLE 34IN LL (TOURNIQUET CUFF) ×2 IMPLANT
CUFF TOURNIQUET SINGLE 44IN (TOURNIQUET CUFF) IMPLANT
DISTAL AUG 8 LEFT (Knees) ×8 IMPLANT
DRAPE IMP U-DRAPE 54X76 (DRAPES) ×2 IMPLANT
DRAPE ORTHO SPLIT 77X108 STRL (DRAPES) ×8
DRAPE SURG ORHT 6 SPLT 77X108 (DRAPES) ×4 IMPLANT
DRAPE U-SHAPE 47X51 STRL (DRAPES) ×4 IMPLANT
DRSG PAD ABDOMINAL 8X10 ST (GAUZE/BANDAGES/DRESSINGS) ×8 IMPLANT
DURAPREP 26ML APPLICATOR (WOUND CARE) ×6 IMPLANT
ELECT REM PT RETURN 9FT ADLT (ELECTROSURGICAL) ×4
ELECTRODE REM PT RTRN 9FT ADLT (ELECTROSURGICAL) ×2 IMPLANT
EVACUATOR 1/8 PVC DRAIN (DRAIN) IMPLANT
FACESHIELD STD STERILE (MASK) ×8 IMPLANT
FEMORAL ADAPTER (Orthopedic Implant) ×3 IMPLANT
FEMUR LFT TC3 REVISION (Knees) ×3 IMPLANT
GAUZE SPONGE 4X4 12PLY STRL (GAUZE/BANDAGES/DRESSINGS) ×4 IMPLANT
GAUZE SPONGE 4X4 12PLY STRL LF (GAUZE/BANDAGES/DRESSINGS) ×2 IMPLANT
GAUZE XEROFORM 1X8 LF (GAUZE/BANDAGES/DRESSINGS) ×4 IMPLANT
GAUZE XEROFORM 5X9 LF (GAUZE/BANDAGES/DRESSINGS) ×2 IMPLANT
GLOVE BIO SURGEON STRL SZ8 (GLOVE) ×4 IMPLANT
GLOVE BIOGEL PI IND STRL 8 (GLOVE) ×4 IMPLANT
GLOVE BIOGEL PI INDICATOR 8 (GLOVE) ×4
GLOVE ORTHO TXT STRL SZ7.5 (GLOVE) ×4 IMPLANT
GOWN STRL REUS W/ TWL LRG LVL3 (GOWN DISPOSABLE) ×6 IMPLANT
GOWN STRL REUS W/ TWL XL LVL3 (GOWN DISPOSABLE) ×4 IMPLANT
GOWN STRL REUS W/TWL LRG LVL3 (GOWN DISPOSABLE) ×12
GOWN STRL REUS W/TWL XL LVL3 (GOWN DISPOSABLE) ×8
HANDPIECE INTERPULSE COAX TIP (DISPOSABLE)
IMMOBILIZER KNEE 22 (SOFTGOODS) ×2 IMPLANT
IMMOBILIZER KNEE 22 UNIV (SOFTGOODS) ×4 IMPLANT
INSERT TIBIAL TC3 RP SZ5 12.5 (Knees) ×2 IMPLANT
KIT BASIN OR (CUSTOM PROCEDURE TRAY) ×4 IMPLANT
KIT ROOM TURNOVER OR (KITS) ×4 IMPLANT
MANIFOLD NEPTUNE II (INSTRUMENTS) ×4 IMPLANT
NS IRRIG 1000ML POUR BTL (IV SOLUTION) ×4 IMPLANT
PACK TOTAL JOINT (CUSTOM PROCEDURE TRAY) ×4 IMPLANT
PACK UNIVERSAL I (CUSTOM PROCEDURE TRAY) ×2 IMPLANT
PAD ABD 8X10 STRL (GAUZE/BANDAGES/DRESSINGS) ×3 IMPLANT
PAD ARMBOARD 7.5X6 YLW CONV (MISCELLANEOUS) ×8 IMPLANT
PAD CAST 4YDX4 CTTN HI CHSV (CAST SUPPLIES) IMPLANT
PADDING CAST COTTON 4X4 STRL (CAST SUPPLIES) ×4
PADDING CAST COTTON 6X4 STRL (CAST SUPPLIES) ×4 IMPLANT
PATELLA DOME PFC 38MM (Knees) ×2 IMPLANT
PUTTY DBX 2.5CC (Putty) ×4 IMPLANT
PUTTY DBX 2.5CC DEPUY (Putty) IMPLANT
PUTTY DBX 5CC (Putty) ×2 IMPLANT
SET HNDPC FAN SPRY TIP SCT (DISPOSABLE) IMPLANT
SET PAD KNEE POSITIONER (MISCELLANEOUS) ×4 IMPLANT
SLEEVE UNIV FEM DIST PRO SZ 31 (Sleeve) ×2 IMPLANT
SPONGE LAP 18X18 X RAY DECT (DISPOSABLE) ×2 IMPLANT
STAPLER VISISTAT 35W (STAPLE) ×8 IMPLANT
STEM TIBIA PFC 13X30MM (Stem) ×2 IMPLANT
STEM TIBIA PFC 13X60MM (Stem) ×2 IMPLANT
SUCTION FRAZIER HANDLE 10FR (MISCELLANEOUS) ×2
SUCTION TUBE FRAZIER 10FR DISP (MISCELLANEOUS) ×2 IMPLANT
SUT ETHIBOND NAB CT1 #1 30IN (SUTURE) ×4 IMPLANT
SUT VIC AB 0 CT1 27 (SUTURE) ×8
SUT VIC AB 0 CT1 27XBRD ANBCTR (SUTURE) ×4 IMPLANT
SUT VIC AB 1 CT1 27 (SUTURE) ×12
SUT VIC AB 1 CT1 27XBRD ANBCTR (SUTURE) ×4 IMPLANT
SUT VIC AB 2-0 CT1 27 (SUTURE) ×20
SUT VIC AB 2-0 CT1 TAPERPNT 27 (SUTURE) ×7 IMPLANT
SWAB COLLECTION DEVICE MRSA (MISCELLANEOUS) ×2 IMPLANT
SWAB CULTURE ESWAB REG 1ML (MISCELLANEOUS) ×4 IMPLANT
TIP HIGH FLOW IRRIGATION COAX (MISCELLANEOUS) ×2 IMPLANT
TOWEL OR 17X24 6PK STRL BLUE (TOWEL DISPOSABLE) ×4 IMPLANT
TOWEL OR 17X26 10 PK STRL BLUE (TOWEL DISPOSABLE) ×4 IMPLANT
TRAY FOLEY CATH 16FR SILVER (SET/KITS/TRAYS/PACK) ×2 IMPLANT
TRAY SLEEVE CEM ML (Knees) ×2 IMPLANT
TRAY TIB SZ 5 REVISION (Knees) ×2 IMPLANT
WATER STERILE IRR 1000ML POUR (IV SOLUTION) ×12 IMPLANT
WEDGE SZ 5 5MM 10MM (Knees) ×4 IMPLANT
WRAP KNEE MAXI GEL POST OP (GAUZE/BANDAGES/DRESSINGS) ×4 IMPLANT
YANKAUER SUCT BULB TIP NO VENT (SUCTIONS) ×3 IMPLANT

## 2017-10-16 NOTE — H&P (Signed)
TOTAL KNEE REVISION ADMISSION H&P  Patient is being admitted for left revision total knee arthroplasty.  Subjective:  Chief Complaint:left knee pain.  HPI: [name], 62 y.o. male, has a history of pain and functional disability in the left knee(s) due to failed previous arthroplasty and patient has failed non-surgical conservative treatments for greater than 12 weeks to include NSAID's and/or analgesics, corticosteriod injections, flexibility and strengthening excercises and activity modification. The indications for the revision of the total knee arthroplasty are loosening of one or more components. Onset of symptoms was gradual starting 1 years ago with gradually worsening course since that time.  Prior procedures on the left knee(s) include arthroplasty.  Patient currently rates pain in the left knee(s) at 7 out of 10 with activity. There is night pain, worsening of pain with activity and weight bearing, pain with passive range of motion and joint swelling.  Patient has evidence of prosthetic loosening by imaging studies. This condition presents safety issues increasing the risk of falls.  There is no current active infection.  Patient Active Problem List   Diagnosis Date Noted  . Loose left total knee arthroplasty (HCC) 10/16/2017  . Lumbar stenosis with neurogenic claudication 10/30/2016  . 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 Status post revision of total replacement of left knee 09/20/2015  . 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 Effusion of left knee joint; questionable prosthetic joint infection 05/07/2015  . Knee effusion 05/07/2015  . Status post total left knee replacement 01/08/2015  . Osteoarthritis of left knee 12/25/2014  . Lumbar spondylosis L2-5 09/14/2012   Past Medical History:  Diagnosis Date  . ADD (attention deficit disorder)    takes  Ritalin daily  . Anemia    denies  . Anxiety   . Arthritis    RA, OA, - shoulder, spine, knee   . Cataract    left eye  . Depression    takes Cymbalta daily, OCD  . Enlarged prostate    takes Flomax daily   . GERD (gastroesophageal reflux disease)    takes Omeprazole daily  . Headache    hx of migraines-none recent  . History of blood transfusion   . History of kidney stones    x1 15 years ago  . Hypertension   . Numbness and tingling    fingers bilat comes and goes   . OCD (obsessive compulsive disorder)   . Restless leg syndrome    takes requip daily  . Rheumatoid arthritis(714.0)    sees Dr Sharmon Revere @ [facility] in [place]  . Wears glasses     Past Surgical History:  Procedure Laterality Date  . ABDOMINAL EXPOSURE  09/13/2012   Procedure: ABDOMINAL EXPOSURE;  Surgeon: Larina Earthly, MD;  Location: [facility];  Service: Vascular;  Laterality: N/A;  . ANTERIOR LAT LUMBAR FUSION  09/13/2012   Procedure: ANTERIOR LATERAL LUMBAR FUSION 2 LEVELS;  Surgeon: Reinaldo Meeker, MD;  Location: [facility];  Service: Neurosurgery;  Laterality: Right;  lumbar two three,three-four  . ANTERIOR LUMBAR FUSION  09/13/2012   Procedure: ANTERIOR LUMBAR FUSION 1 LEVEL;  Surgeon: Reinaldo Meeker, MD;  Location: [facility];  Service: Neurosurgery;  Laterality: N/A;  lumbar four-five  . EXCISIONAL TOTAL KNEE ARTHROPLASTY WITH ANTIBIOTIC SPACERS Left 08/06/2015   Procedure: EXCISION OF LEFT TOTAL KNEE ARTHROPLASTY WITH PLACEMENT OF ANTIBIOTIC SPACERS;  Surgeon: Kathryne Hitch, MD;  Location: [facility];  Service: Orthopedics;  Laterality: Left;  . HAND TENDON SURGERY Right 06/2012   done @ [facility] in [place]  . I&D KNEE WITH POLY EXCHANGE Left 05/07/2015   Procedure: IRRIGATION AND DEBRIDEMENT LEFT KNEE WITH POLY EXCHANGE;  Surgeon: Kathryne Hitch, MD;  Location: [facility];  Service: Orthopedics;  Laterality: Left;  . KNEE ARTHROSCOPY Left 06/22/2015   Procedure: LEFT KNEE ARTHROSCOPY WITH  DEBRIDEMENT, SYNOVECTOMY;  Surgeon: Kathryne Hitch, MD;  Location: [facility] OR;  Service: Orthopedics;  Laterality: Left;  . LUMBAR PERCUTANEOUS PEDICLE SCREW 2 LEVEL  09/13/2012   Procedure: LUMBAR PERCUTANEOUS PEDICLE SCREW 2 LEVEL;  Surgeon: Reinaldo Meeker, MD;  Location: [facility];  Service: Neurosurgery;  Laterality: Left;  left pedicle screws two -three,three-four  . ROTATOR CUFF REPAIR Right 2008    done in [facility]  . SHOULDER ARTHROSCOPY Right 10/02/2016  . TOTAL HIP ARTHROPLASTY Right 01/2012   done in [facility]  . TOTAL KNEE ARTHROPLASTY Left 01/08/2015   Procedure: LEFT TOTAL KNEE ARTHROPLASTY;  Surgeon: Kathryne Hitch, MD;  Location: [facility];  Service: Orthopedics;  Laterality: Left;  . TOTAL KNEE REVISION Left 09/20/2015   Procedure: RE-IMPLANT/REVISION LEFT TOTAL KNEE ARTHROPLASTY;  Surgeon: Kathryne Hitch, MD;  Location: [facility] OR;  Service: Orthopedics;  Laterality: Left;    Current Facility-Administered Medications  Medication Dose Route Frequency Provider Last Rate Last Dose  . acetaminophen (TYLENOL) 500 MG tablet           . clindamycin (CLEOCIN) IVPB 900 mg  900 mg Intravenous To SS-Surg Kathryne Hitch, MD      . fentaNYL (SUBLIMAZE) 100 MCG/2ML injection           . lactated ringers infusion   Intravenous Continuous Val Eagle, MD 50 mL/hr at 10/16/17 1152    . midazolam (VERSED) 2 MG/2ML injection           . tranexamic acid (CYKLOKAPRON) 1,000 mg in sodium chloride 0.9 % 100 mL IVPB  1,000 mg Intravenous To OR Kathryne Hitch, MD       Allergies  Allergen Reactions  . Penicillins Rash and Other (See Comments)    Has patient had a PCN reaction causing immediate rash, facial/tongue/throat swelling, SOB or lightheadedness with hypotension: No Has patient had a PCN reaction causing severe rash involving mucus membranes or skin necrosis: No- around scars.  Has patient had a PCN reaction that required hospitalization No Has  patient had a PCN reaction occurring within the last 10 years: No If all of the above answers are "NO", then may proceed with Cephalosporin use.     Social History   Tobacco Use  . Smoking status: Never Smoker  . Smokeless tobacco: Never Used  Substance Use Topics  . Alcohol use: Yes    Comment: rarely     Family History  Problem Relation Age of Onset  . CAD Father   . Alzheimer's disease Father       Review of Systems  Musculoskeletal: Positive for joint pain.  All other systems reviewed and are negative.    Objective:  Physical Exam  Constitutional: He is oriented to person, place, and time. He appears well-developed and well-nourished.  HENT:  Head: Normocephalic and atraumatic.  Eyes: EOM are normal. Pupils are equal, round, and reactive to light.  Neck: Normal range of motion. Neck supple.  Cardiovascular: Normal rate and regular rhythm.  Respiratory: Effort normal and breath sounds normal.  GI: Soft.  Bowel sounds are normal.  Musculoskeletal:       Left shoulder: He exhibits decreased range of motion, tenderness, bony tenderness and decreased strength.       Left knee: He exhibits effusion and bony tenderness. Tenderness found. Medial joint line and lateral joint line tenderness noted.  Neurological: He is alert and oriented to person, place, and time.  Skin: Skin is warm and dry.  Psychiatric: He has a normal mood and affect.    Vital signs in last 24 hours: Temp:  [97.7 F (36.5 C)] 97.7 F (36.5 C) (03/05 1044) Pulse Rate:  [93] 93 (03/05 1044) Resp:  [20] 20 (03/05 1044) BP: (129)/(80) 129/80 (03/05 1044) SpO2:  [96 %] 96 % (03/05 1044) Weight:  [175 lb (79.4 kg)] 175 lb (79.4 kg) (03/05 1045)  Labs:  Estimated body mass index is 27.41 kg/m as calculated from the following:   Height as of this encounter: 5\' 7"  (1.702 m).   Weight as of this encounter: 175 lb (79.4 kg).  Imaging Review Plain radiographs demonstrate loosening of the  prosthesis  Assessment/Plan:  Failed left knee revision arthroplasty  The patient history, physical examination, clinical judgment of the provider and imaging studies are consistent with loosening of left knee(s), previous total knee arthroplasty. Revision total knee arthroplasty is deemed medically necessary. The treatment options including medical management, injection therapy, arthroscopy and revision arthroplasty were discussed at length. The risks and benefits of revision total knee arthroplasty were presented and reviewed. The risks due to aseptic loosening, infection, stiffness, patella tracking problems, thromboembolic complications and other imponderables were discussed. The patient acknowledged the explanation, agreed to proceed with the plan and consent was signed. Patient is being admitted for inpatient treatment for surgery, pain control, PT, OT, prophylactic antibiotics, VTE prophylaxis, progressive ambulation and ADL's and discharge planning.The patient is planning to be discharged home with home health services

## 2017-10-16 NOTE — Transfer of Care (Signed)
Immediate Anesthesia Transfer of Care Note  Patient: [name]  Procedure(s) Performed: STEROID INJECTION (Left Shoulder) TOTAL KNEE REVISION (Left Knee)  Patient Location: PACU  Anesthesia Type:MAC, Regional and Spinal  Level of Consciousness: awake, oriented and patient cooperative  Airway & Oxygen Therapy: Patient Spontanous Breathing and Patient connected to face mask oxygen  Post-op Assessment: Report given to RN and Post -op Vital signs reviewed and stable  Post vital signs: Reviewed and stable  Last Vitals:  Vitals:   10/16/17 1315 10/16/17 1803  BP: 109/72   Pulse: 83   Resp: 12   Temp:  (P) 36.5 C  SpO2: 98%     Last Pain:  Vitals:   10/16/17 1803  TempSrc:   PainSc: (P) 0-No pain         Complications: No apparent anesthesia complications

## 2017-10-16 NOTE — Brief Op Note (Signed)
10/16/2017  5:24 PM  PATIENT:  [name]  62 y.o. male  PRE-OPERATIVE DIAGNOSIS:  failed left knee revision arthroplasty  POST-OPERATIVE DIAGNOSIS:  failed left knee revision arthroplasty  PROCEDURE:  Procedure(s): STEROID INJECTION (Left) TOTAL KNEE REVISION (Left)  SURGEON:  Surgeon(s) and Role:    Kathryne Hitch, MD - Primary  PHYSICIAN ASSISTANT: Mikey Kirschner, PA-c  ANESTHESIA:   regional and spinal  EBL:  300 mL   COUNTS:  YES  TOURNIQUET:   Total Tourniquet Time Documented: Thigh (Left) - 122 minutes Thigh (Left) - 44 minutes Total: Thigh (Left) - 166 minutes   PLAN OF CARE: Admit to inpatient   PATIENT DISPOSITION:  PACU - hemodynamically stable.   Delay start of Pharmacological VTE agent (>24hrs) due to surgical blood loss or risk of bleeding: no

## 2017-10-16 NOTE — Anesthesia Postprocedure Evaluation (Signed)
Anesthesia Post Note  Patient: [name]  Procedure(s) Performed: STEROID INJECTION (Left Shoulder) TOTAL KNEE REVISION (Left Knee)     Patient location during evaluation: PACU Anesthesia Type: Regional and Spinal Level of consciousness: awake Pain management: pain level controlled Vital Signs Assessment: post-procedure vital signs reviewed and stable Respiratory status: spontaneous breathing Cardiovascular status: stable Anesthetic complications: no    Last Vitals:  Vitals:   10/16/17 1803 10/16/17 1816  BP:  118/74  Pulse:  91  Resp:  (!) 21  Temp: 36.5 C   SpO2:  95%    Last Pain:  Vitals:   10/16/17 1830  TempSrc:   PainSc: 6                  Lorece Keach

## 2017-10-16 NOTE — Anesthesia Preprocedure Evaluation (Addendum)
Anesthesia Evaluation  Patient identified by MRN, date of birth, ID band Patient awake    Reviewed: Allergy & Precautions, NPO status , Patient's Chart, lab work & pertinent test results  History of Anesthesia Complications Negative for: history of anesthetic complications  Airway Mallampati: I  TM Distance: >3 FB Neck ROM: Full    Dental  (+) Teeth Intact   Pulmonary neg pulmonary ROS,    breath sounds clear to auscultation       Cardiovascular hypertension, Pt. on medications (-) angina(-) Past MI and (-) CHF  Rhythm:Regular     Neuro/Psych  Headaches, PSYCHIATRIC DISORDERS Anxiety Depression    GI/Hepatic Neg liver ROS, GERD  Medicated and Controlled,  Endo/Other  negative endocrine ROS  Renal/GU negative Renal ROS     Musculoskeletal  (+) Arthritis ,   Abdominal   Peds  Hematology negative hematology ROS (+)   Anesthesia Other Findings   Reproductive/Obstetrics                            Anesthesia Physical Anesthesia Plan  ASA: II  Anesthesia Plan: MAC, Regional and Spinal   Post-op Pain Management:    Induction:   PONV Risk Score and Plan: 1 and Treatment may vary due to age or medical condition  Airway Management Planned: Nasal Cannula  Additional Equipment: None  Intra-op Plan:   Post-operative Plan:   Informed Consent: I have reviewed the patients History and Physical, chart, labs and discussed the procedure including the risks, benefits and alternatives for the proposed anesthesia with the patient or authorized representative who has indicated his/her understanding and acceptance.   Dental advisory given  Plan Discussed with: CRNA and Surgeon  Anesthesia Plan Comments:         Anesthesia Quick Evaluation

## 2017-10-16 NOTE — Anesthesia Procedure Notes (Signed)
Spinal  Patient location during procedure: pre-op Start time: 10/16/2017 1:06 PM End time: 10/16/2017 1:09 PM Staffing Anesthesiologist: Val Eagle, MD Preanesthetic Checklist Completed: patient identified, surgical consent, pre-op evaluation, timeout performed, IV checked, risks and benefits discussed and monitors and equipment checked Spinal Block Patient position: sitting Prep: site prepped and draped and DuraPrep Patient monitoring: heart rate, cardiac monitor, continuous pulse ox and blood pressure Approach: midline Location: L4-5 Injection technique: single-shot Needle Needle type: Pencan  Needle gauge: 24 G Needle length: 10 cm Assessment Sensory level: T6

## 2017-10-16 NOTE — Anesthesia Procedure Notes (Signed)
Anesthesia Regional Block: Adductor canal block   Pre-Anesthetic Checklist: ,, timeout performed, Correct Patient, Correct Site, Correct Laterality, Correct Procedure, Correct Position, site marked, Risks and benefits discussed,  Surgical consent,  Pre-op evaluation,  At surgeon's request and post-op pain management  Laterality: Lower and Left  Prep: chloraprep       Needles:  Injection technique: Single-shot  Needle Type: Echogenic Stimulator Needle          Additional Needles:   Procedures:,,,, ultrasound used (permanent image in chart),,,,  Narrative:  Start time: 10/16/2017 1:12 PM End time: 10/16/2017 1:15 PM Injection made incrementally with aspirations every 5 mL.  Performed by: Personally  Anesthesiologist: Val Eagle, MD  Additional Notes: H+P and labs reviewed, risks and benefits discussed with patient, procedure tolerated well without complications

## 2017-10-17 ENCOUNTER — Encounter (HOSPITAL_COMMUNITY): Payer: Self-pay | Admitting: Orthopaedic Surgery

## 2017-10-17 LAB — BASIC METABOLIC PANEL
Anion gap: 10 (ref 5–15)
BUN: 22 mg/dL — AB (ref 6–20)
CALCIUM: 8.6 mg/dL — AB (ref 8.9–10.3)
CO2: 28 mmol/L (ref 22–32)
CREATININE: 0.94 mg/dL (ref 0.61–1.24)
Chloride: 98 mmol/L — ABNORMAL LOW (ref 101–111)
GFR calc Af Amer: >60 mL/min (ref >60)
GLUCOSE: 122 mg/dL — AB (ref 65–99)
Potassium: 3.7 mmol/L (ref 3.5–5.1)
Sodium: 136 mmol/L (ref 135–145)

## 2017-10-17 LAB — CBC
HCT: 29.7 % — ABNORMAL LOW (ref 39.0–52.0)
Hemoglobin: 10 g/dL — ABNORMAL LOW (ref 13.0–17.0)
MCH: 29.9 pg (ref 26.0–34.0)
MCHC: 33.7 g/dL (ref 30.0–36.0)
MCV: 88.7 fL (ref 78.0–100.0)
PLATELETS: 222 10*3/uL (ref 150–400)
RBC: 3.35 MIL/uL — AB (ref 4.22–5.81)
RDW: 13.8 % (ref 11.5–15.5)
WBC: 10.8 10*3/uL — ABNORMAL HIGH (ref 4.0–10.5)

## 2017-10-17 NOTE — Discharge Instructions (Addendum)
Information on my medicine - XARELTO (Rivaroxaban)  This medication education was reviewed with me or my healthcare representative as part of my discharge preparation.  The pharmacist that spoke with me during my hospital stay was:  Fayne Norrie, [facility]  Why was Xarelto prescribed for you? Xarelto was prescribed for you to reduce the risk of blood clots forming after orthopedic surgery. The medical term for these abnormal blood clots is venous thromboembolism (VTE).  What do you need to know about xarelto ? Take your Xarelto ONCE DAILY at the same time every day. You may take it either with or without food.  If you have difficulty swallowing the tablet whole, you may crush it and mix in applesauce just prior to taking your dose.  Take Xarelto exactly as prescribed by your doctor and DO NOT stop taking Xarelto without talking to the doctor who prescribed the medication.  Stopping without other VTE prevention medication to take the place of Xarelto may increase your risk of developing a clot.  After discharge, you should have regular check-up appointments with your healthcare provider that is prescribing your Xarelto.    What do you do if you miss a dose? If you miss a dose, take it as soon as you remember on the same day then continue your regularly scheduled once daily regimen the next day. Do not take two doses of Xarelto on the same day.   Important Safety Information A possible side effect of Xarelto is bleeding. You should call your healthcare provider right away if you experience any of the following: ? Bleeding from an injury or your nose that does not stop. ? Unusual colored urine (red or dark brown) or unusual colored stools (red or black). ? Unusual bruising for unknown reasons. ? A serious fall or if you hit your head (even if there is no bleeding).  Some medicines may interact with Xarelto and might increase your risk of bleeding while on Xarelto. To help  avoid this, consult your healthcare provider or pharmacist prior to using any new prescription or non-prescription medications, including herbals, vitamins, non-steroidal anti-inflammatory drugs (NSAIDs) and supplements.  This website has more information on Xarelto: VisitDestination.com.br.  INSTRUCTIONS AFTER JOINT REPLACEMENT   o Remove items at home which could result in a fall. This includes throw rugs or furniture in walking pathways o ICE to the affected joint every three hours while awake for 30 minutes at a time, for at least the first 3-5 days, and then as needed for pain and swelling.  Continue to use ice for pain and swelling. You may notice swelling that will progress down to the foot and ankle.  This is normal after surgery.  Elevate your leg when you are not up walking on it.   o Continue to use the breathing machine you got in the hospital (incentive spirometer) which will help keep your temperature down.  It is common for your temperature to cycle up and down following surgery, especially at night when you are not up moving around and exerting yourself.  The breathing machine keeps your lungs expanded and your temperature down.   DIET:  As you were doing prior to hospitalization, we recommend a well-balanced diet.  DRESSING / WOUND CARE / SHOWERING  Keep the surgical dressing until follow up.  The dressing is water proof, so you can shower without any extra covering.  IF THE DRESSING FALLS OFF or the wound gets wet inside, change the dressing with sterile gauze.  Please use good hand washing techniques before changing the dressing.  Do not use any lotions or creams on the incision until instructed by your surgeon.    ACTIVITY  o Increase activity slowly as tolerated, but follow the weight bearing instructions below.   o No driving for 6 weeks or until further direction given by your physician.  You cannot drive while taking narcotics.  o No lifting or carrying greater than 10 lbs.  until further directed by your surgeon. o Avoid periods of inactivity such as sitting longer than an hour when not asleep. This helps prevent blood clots.  o You may return to work once you are authorized by your doctor.     WEIGHT BEARING   Weight bearing as tolerated with assist device (walker, cane, etc) as directed, use it as long as suggested by your surgeon or therapist, typically at least 4-6 weeks.   EXERCISES  Results after joint replacement surgery are often greatly improved when you follow the exercise, range of motion and muscle strengthening exercises prescribed by your doctor. Safety measures are also important to protect the joint from further injury. Any time any of these exercises cause you to have increased pain or swelling, decrease what you are doing until you are comfortable again and then slowly increase them. If you have problems or questions, call your caregiver or physical therapist for advice.   Rehabilitation is important following a joint replacement. After just a few days of immobilization, the muscles of the leg can become weakened and shrink (atrophy).  These exercises are designed to build up the tone and strength of the thigh and leg muscles and to improve motion. Often times heat used for twenty to thirty minutes before working out will loosen up your tissues and help with improving the range of motion but do not use heat for the first two weeks following surgery (sometimes heat can increase post-operative swelling).   These exercises can be done on a training (exercise) mat, on the floor, on a table or on a bed. Use whatever works the best and is most comfortable for you.    Use music or television while you are exercising so that the exercises are a pleasant break in your day. This will make your life better with the exercises acting as a break in your routine that you can look forward to.   Perform all exercises about fifteen times, three times per day or as  directed.  You should exercise both the operative leg and the other leg as well.  Exercises include:    Quad Sets - Tighten up the muscle on the front of the thigh (Quad) and hold for 5-10 seconds.    Straight Leg Raises - With your knee straight (if you were given a brace, keep it on), lift the leg to 60 degrees, hold for 3 seconds, and slowly lower the leg.  Perform this exercise against resistance later as your leg gets stronger.   Leg Slides: Lying on your back, slowly slide your foot toward your buttocks, bending your knee up off the floor (only go as far as is comfortable). Then slowly slide your foot back down until your leg is flat on the floor again.   Angel Wings: Lying on your back spread your legs to the side as far apart as you can without causing discomfort.   Hamstring Strength:  Lying on your back, push your heel against the floor with your leg straight by tightening up the  muscles of your buttocks.  Repeat, but this time bend your knee to a comfortable angle, and push your heel against the floor.  You may put a pillow under the heel to make it more comfortable if necessary.   A rehabilitation program following joint replacement surgery can speed recovery and prevent re-injury in the future due to weakened muscles. Contact your doctor or a physical therapist for more information on knee rehabilitation.    CONSTIPATION  Constipation is defined medically as fewer than three stools per week and severe constipation as less than one stool per week.  Even if you have a regular bowel pattern at home, your normal regimen is likely to be disrupted due to multiple reasons following surgery.  Combination of anesthesia, postoperative narcotics, change in appetite and fluid intake all can affect your bowels.   YOU MUST use at least one of the following options; they are listed in order of increasing strength to get the job done.  They are all available over the counter, and you may need to  use some, POSSIBLY even all of these options:    Drink plenty of fluids (prune juice may be helpful) and high fiber foods Colace 100 mg by mouth twice a day  Senokot for constipation as directed and as needed Dulcolax (bisacodyl), take with full glass of water  Miralax (polyethylene glycol) once or twice a day as needed.  If you have tried all these things and are unable to have a bowel movement in the first 3-4 days after surgery call either your surgeon or your primary doctor.    If you experience loose stools or diarrhea, hold the medications until you stool forms back up.  If your symptoms do not get better within 1 week or if they get worse, check with your doctor.  If you experience "the worst abdominal pain ever" or develop nausea or vomiting, please contact the office immediately for further recommendations for treatment.   ITCHING:  If you experience itching with your medications, try taking only a single pain pill, or even half a pain pill at a time.  You can also use Benadryl over the counter for itching or also to help with sleep.   TED HOSE STOCKINGS:  Use stockings on both legs until for at least 2 weeks or as directed by physician office. They may be removed at night for sleeping.  MEDICATIONS:  See your medication summary on the After Visit Summary that nursing will review with you.  You may have some home medications which will be placed on hold until you complete the course of blood thinner medication.  It is important for you to complete the blood thinner medication as prescribed.  PRECAUTIONS:  If you experience chest pain or shortness of breath - call 911 immediately for transfer to the hospital emergency department.   If you develop a fever greater that 101 F, purulent drainage from wound, increased redness or drainage from wound, foul odor from the wound/dressing, or calf pain - CONTACT YOUR SURGEON.                                                   FOLLOW-UP  APPOINTMENTS:  If you do not already have a post-op appointment, please call the office for an appointment to be seen by your surgeon.  Guidelines for how  soon to be seen are listed in your “After Visit Summary”, but are typically between 1-4 weeks after surgery. ° °OTHER INSTRUCTIONS:  ° °Knee Replacement:  Do not place pillow under knee, focus on keeping the knee straight while resting. CPM instructions: 0-90 degrees, 2 hours in the morning, 2 hours in the afternoon, and 2 hours in the evening. Place foam block, curve side up under heel at all times except when in CPM or when walking.  DO NOT modify, tear, cut, or change the foam block in any way. ° °MAKE SURE YOU:  °• Understand these instructions.  °• Get help right away if you are not doing well or get worse.  ° ° °Thank you for letting us be a part of your medical care team.  It is a privilege we respect greatly.  We hope these instructions will help you stay on track for a fast and full recovery!  ° ° °

## 2017-10-17 NOTE — Evaluation (Signed)
Occupational Therapy Evaluation and Discharge Patient Details Name: [name] MRN: [id number] DOB: 03/14/1956 Today's Date: 10/17/2017    History of Present Illness 62 y.o. male s/p L Total Knee Revision after aseptic loosening 3/05. PMH includes: HTN, RA, OCD, GERD, ADD, L TKA 2016, RTC repair 2008, lumbar fusion.    Clinical Impression   Pt reports he was independent with ADL PTA. Currently pt overall supervision for ADL and functional mobility with the exception of min assist for LB ADL. Pt planning to d/c home with intermittent supervision from family. No further acute OT needs identified; signing off at this time. Please re-consult if needs change. Thank you for this referral.    Follow Up Recommendations  No OT follow up;Supervision - Intermittent    Equipment Recommendations  None recommended by OT    Recommendations for Other Services       Precautions / Restrictions Precautions Precautions: Fall Required Braces or Orthoses: Other Brace/Splint Other Brace/Splint: L bledsoe brace locked in full extension Restrictions Weight Bearing Restrictions: Yes LLE Weight Bearing: Touchdown weight bearing      Mobility Bed Mobility Overal bed mobility: Modified Independent             General bed mobility comments: HOB elevated, no physical assist  Transfers Overall transfer level: Needs assistance Equipment used: Rolling walker (2 wheeled) Transfers: Sit to/from Stand Sit to Stand: Supervision         General transfer comment: for safety, good hand placement and technique    Balance Overall balance assessment: Needs assistance Sitting-balance support: Feet supported;No upper extremity supported Sitting balance-Leahy Scale: Good     Standing balance support: Bilateral upper extremity supported Standing balance-Leahy Scale: Fair                             ADL either performed or assessed with clinical judgement   ADL Overall ADL's : Needs  assistance/impaired Eating/Feeding: Independent;Sitting   Grooming: Supervision/safety;Standing   Upper Body Bathing: Set up;Sitting   Lower Body Bathing: Minimal assistance;Sit to/from stand   Upper Body Dressing : Set up;Sitting   Lower Body Dressing: Minimal assistance;Sit to/from stand Lower Body Dressing Details (indicate cue type and reason): Educated on compensatory strategies for LB ADL Toilet Transfer: Supervision/safety;Ambulation;RW Toilet Transfer Details (indicate cue type and reason): Simulated by sit to stand from EOB with functional mobility       Tub/Shower Transfer Details (indicate cue type and reason): Discussed proper technique for walk in shower transfer; pt verbalized understanding Functional mobility during ADLs: Supervision/safety;Rolling walker General ADL Comments: Pt able to maintain LLE TDWB throughout all activities     Vision         Perception     Praxis      Pertinent Vitals/Pain Pain Assessment: Faces Faces Pain Scale: Hurts little more Pain Location: L knee Pain Descriptors / Indicators: Discomfort;Sore Pain Intervention(s): Monitored during session;Ice applied     Hand Dominance Right   Extremity/Trunk Assessment Upper Extremity Assessment Upper Extremity Assessment: Overall WFL for tasks assessed;LUE deficits/detail LUE Deficits / Details: c/o L shoulder pain this AM; not painful currently. Full AROM and strength   Lower Extremity Assessment Lower Extremity Assessment: Defer to PT evaluation   Cervical / Trunk Assessment Cervical / Trunk Assessment: Normal   Communication Communication Communication: No difficulties   Cognition Arousal/Alertness: Awake/alert Behavior During Therapy: WFL for tasks assessed/performed Overall Cognitive Status: Within Functional Limits for tasks assessed  General Comments       Exercises     Shoulder Instructions      Home Living  Family/patient expects to be discharged to:: Private residence Living Arrangements: Spouse/significant other Available Help at Discharge: Family;Available PRN/intermittently Type of Home: House Home Access: Stairs to enter [employer] of Steps: 2 Entrance Stairs-Rails: None Home Layout: Two level;Able to live on main level with bedroom/bathroom     Bathroom Shower/Tub: Producer, television/film/video: Standard     Home Equipment: Environmental consultant - 2 wheels;Bedside commode;Crutches          Prior Functioning/Environment Level of Independence: Independent        Comments: independent for last year, driving, no AD        OT Problem List:        OT Treatment/Interventions:      OT Goals(Current goals can be found in the care plan section) Acute Rehab OT Goals Patient Stated Goal: return home OT Goal Formulation: All assessment and education complete, DC therapy  OT Frequency:     Barriers to D/C:            Co-evaluation              AM-PAC PT "6 Clicks" Daily Activity     Outcome Measure Help from another person eating meals?: None Help from another person taking care of personal grooming?: A Little Help from another person toileting, which includes using toliet, bedpan, or urinal?: A Little Help from another person bathing (including washing, rinsing, drying)?: A Little Help from another person to put on and taking off regular upper body clothing?: None Help from another person to put on and taking off regular lower body clothing?: A Little 6 Click Score: 20   End of Session Equipment Utilized During Treatment: Rolling walker;Other (comment)(L bledsoe brace) Nurse Communication: Mobility status  Activity Tolerance: Patient tolerated treatment well Patient left: in bed;with call bell/phone within reach;with nursing/sitter in room  OT Visit Diagnosis: Unsteadiness on feet (R26.81);Other abnormalities of gait and mobility (R26.89);Pain Pain -  Right/Left: Left Pain - part of body: Knee                Time: 8101-7510 OT Time Calculation (min): 18 min Charges:  OT General Charges $OT Visit: 1 Visit OT Evaluation $OT Eval Low Complexity: 1 Low G-Codes:     Azie Mcconahy A. Brett Albino, M.S., OTR/L Pager: [phone]  Gaye Alken 10/17/2017, 3:31 PM

## 2017-10-17 NOTE — Progress Notes (Signed)
Orthopedic Tech Progress Note Patient Details:  Sony Schlarb 05-Jul-1956 [id number]  Patient ID: Eoin Willden, male   DOB: November 11, 1955, 62 y.o.   MRN: [id number]   Nikki Dom 10/17/2017, 9:06 AM Called in bio-tech brace order; spoke with Wylene Men

## 2017-10-17 NOTE — Op Note (Signed)
NAMEDEMARQUEZ, CIOLEK             ACCOUNT NO.:  [id number]  MEDICAL RECORD NO.:  [id number]  LOCATION:                                 FACILITY:  PHYSICIAN:  Vanita Panda. 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  PROCEDURE:  Left total knee revision arthroplasty.  FINDINGS:  Aseptic loosening with stat Gram stain showing no organisms and no white blood cells.  SURGEON:  Vanita Panda. Magnus Ivan, M.D.  ASSISTANT:  Tessa Lerner, PA-C.  ANESTHESIA: 1. Left lower extremity adductor canal block. 2. Spinal.  ANTIBIOTICS:  900 mg of IV clindamycin.  BLOOD LOSS:  300 cc.  TOURNIQUET TIME:  2 hours and then an additional 30 minutes after the tourniquet was let down for 30 minutes.  COMPLICATIONS:  Fracture of the medial femoral condyle.  Additional fixation to distal femoral cables.  IMPLANTS:  DePuy revision knee system with size 5 left TC3 cemented femoral component with a 5-degree adapter with distal and posterior augments, size 5 tibial tray with MBT revision tray size 5 with a 12.5- mm rotating platform polyethylene insert, 10-mm revision wedges on the tibia, a 30-mm modular stem on the tibia, and a 30 mm x 60 mm cemented stem on the femur.  INDICATIONS:  Mr. Vecchio is a 62 year old patient with rheumatoid disease, who in 2016 underwent a primary total knee arthroplasty to treat debilitating rheumatoid arthritis involving his left knee.  He continued to have recurrent effusions on that knee after surgery, and even after an irrigation and debridement arthroscopically and then arthrotomy with polyliner exchange, we eventually removed all implants at the end of 2016 and placed a temporary  antibiotic spacer in an effort to rule out any significant infection, but we still treated him with 2- stage revision later in February of 2017.  He then never grew out of infection, but over time has developed significant loosening of his femoral component and the patellar component.  A repeat aspiration of the knee showed only 1000 white cells with no organisms and he has not been on antibiotics.  His white blood cell count, sed rate, and CRP were unremarkable.  At this point, with continued knee pain and recurrent effusion and the evidence of loosening on plain films and 3-phase bone scan, it was recommended a revision arthroplasty.  He understands fully this is a difficult procedure at this point, but our options are limited.  PROCEDURE DESCRIPTION:  After informed consent was obtained, appropriate left knee was marked, he was brought to the operating room, placed supine on the operating table.  An adductor canal block had already been obtained in the holding room, and spinal anesthesia was obtained as well.  A nonsterile tourniquet was placed around his upper left thigh, and his left leg was prepped and draped with DuraPrep and sterile drapes from the thigh down to the toes.  Time-out was called to identify correct patient and correct left knee.  We then used an Sports administrator to  wrap out the leg and tourniquet was inflated to 300 mmHg of pressure.  We then made a direct midline incision over his previous incision, dissected down the knee joint, and carried out a medial parapatellar arthrotomy, and found a very large joint effusion.  We sent off cultures immediately of the surrounding soft tissue and the fluid.  These came back as negative for white cell count, negative for organisms.  We then thoroughly irrigated the knee with normal saline solution and removed all the synovium that we could from the knee.  We then removed the polyethylene insert and the patellar button came off easily.   In the femoral component, there was revision component of the stem, came off easily without any effort.  In the tibial component, we did have to spend some time chipping away at this because there was no significant loosening of it and we probably removed in its entirety.  We then cleaned cement debris throughout the knee and began hand reaming on both the femur and the tibia, so we could put a long stem prosthesis.  We ended up choosing a size 5 revision tibial tray from DePuy and a long stem and we placed 10-mm distal MTP revision wedges on the tibia trial side and the femur trial side.  We did our freshening cuts on the femur and we chose a size 5 femur, made our anterior and posterior cuts and our chamfer cuts, and we chose a long stem for this.  Once we were trialing the final trial components though, we noticed a significant crack in the medial femoral condyle.  It was pretty extensive.  We got reduction forceps and we were able to reduce this fracture.  Once we were done with this reduction, we then mixed our cement and then we cemented the real tibia component with a long stem and wedges and then the real femoral component.  We cemented the patellar button and then we placed a temporary polyethylene insert.  We then placed 2 cables around the femur and placed our real polyethylene insert.  We then let the tourniquet down.  We irrigated the knee with normal saline solution using pulsatile lavage.  We closed the arthrotomy with interrupted #1 Vicryl and #1 Ethibond suture followed by 0 Vicryl in the deep tissue, 2- 0 Vicryl in the subcutaneous tissue, interrupted staples on the skin. Xeroform and well-padded sterile dressings were applied.  He was taken to the recovery room in stable condition.  All final counts were correct.  There were no complications noted.  Of note, Tessa Lerner, PA-C, assisted in the entire case.  His assistance was crucial for facilitating all aspects of  this case.  Also, of note, we will have him nonweightbearing with his knee in full extension until further notice.     Vanita Panda. Magnus Ivan, M.D.     CYB/MEDQ  D:  10/16/2017  T:  10/17/2017  Job:  [id number]

## 2017-10-17 NOTE — Evaluation (Signed)
Physical Therapy Evaluation Patient Details Name: [name] MRN: [id number] DOB: 11-Nov-1955 Today's Date: 10/17/2017   History of Present Illness  62 y.o. male s/p L Total Knee Revision after aseptic loosening 3/05. PMH includes: HTN, RA, OCD, GERD, ADD, L TKA 2016, RTC repair 2008, lumbar fusion.      Clinical Impression  Patient is s/p above surgery resulting in functional limitations due to the deficits listed below (see PT Problem List). PTA, pt independent last year from prior knee surgery, living with wife in 2 story home with 2 stairs to enter. Upon eval pt presents with post op pain and limited by WB status, as well as L shoulder pain. Patient is currently min guard to supervision level for OOB mobility and doing well ambulating in hallway. Next PT visit will focus on stair training, anticipate pt will do well and be ready to return home when medically cleared. Patient will benefit from skilled PT to increase their independence and safety with mobility to allow discharge to the venue listed below.       Follow Up Recommendations Home health PT;Supervision for mobility/OOB    Equipment Recommendations  None recommended by PT    Recommendations for Other Services       Precautions / Restrictions Precautions Precautions: Fall Restrictions Weight Bearing Restrictions: Yes LLE Weight Bearing: Touchdown weight bearing      Mobility  Bed Mobility Overal bed mobility: Modified Independent             General bed mobility comments: using BUE to move surgical leg.  Transfers Overall transfer level: Modified independent Equipment used: Rolling walker (2 wheeled)                Ambulation/Gait Ambulation/Gait assistance: Min guard;Supervision Ambulation Distance (Feet): 50 Feet Assistive device: Rolling walker (2 wheeled) Gait Pattern/deviations: Step-to pattern Gait velocity: decreased   General Gait Details: Good adherence to NWB status. L shoulder hurts  but is tolerable. limited ambulation due to shoulder pain. progressed to supervision with hop to pattern in RW.  Stairs            Wheelchair Mobility    Modified Rankin (Stroke Patients Only)       Balance Overall balance assessment: Needs assistance   Sitting balance-Leahy Scale: Normal       Standing balance-Leahy Scale: Fair Standing balance comment: UE for support due to NWB status                             Pertinent Vitals/Pain      Home Living Family/patient expects to be discharged to:: Private residence Living Arrangements: Spouse/significant other Available Help at Discharge: Family;Available PRN/intermittently(Wife works ) Type of Home: House Home Access: Stairs to enter Entrance Stairs-Rails: None Secretary/administrator of Steps: 2 Home Layout: One level Home Equipment: Environmental consultant - 2 wheels;Bedside commode;Crutches      Prior Function Level of Independence: Independent         Comments: independent for last year, driving, no AD     Hand Dominance   Dominant Hand: Right    Extremity/Trunk Assessment   Upper Extremity Assessment Upper Extremity Assessment: Defer to OT evaluation;Overall [facility] for tasks assessed(c/o of L shoulder pain from fall  a week ago. )    Lower Extremity Assessment Lower Extremity Assessment: (LLE locked in extension. RLE strength WNL)       Communication   Communication: No difficulties  Cognition Arousal/Alertness: Awake/alert Behavior During  Therapy: WFL for tasks assessed/performed Overall Cognitive Status: Within Functional Limits for tasks assessed                                        General Comments      Exercises     Assessment/Plan    PT Assessment Patient needs continued PT services  PT Problem List Decreased strength;Decreased range of motion;Decreased activity tolerance;Decreased balance;Decreased mobility;Pain       PT Treatment Interventions      PT Goals  (Current goals can be found in the Care Plan section)  Acute Rehab PT Goals Patient Stated Goal: return home PT Goal Formulation: With patient Time For Goal Achievement: 10/24/17 Potential to Achieve Goals: Good    Frequency 7X/week   Barriers to discharge        Co-evaluation               AM-PAC PT "6 Clicks" Daily Activity  Outcome Measure Difficulty turning over in bed (including adjusting bedclothes, sheets and blankets)?: A Little Difficulty moving from lying on back to sitting on the side of the bed? : A Little Difficulty sitting down on and standing up from a chair with arms (e.g., wheelchair, bedside commode, etc,.)?: A Little Help needed moving to and from a bed to chair (including a wheelchair)?: A Little Help needed walking in hospital room?: A Little Help needed climbing 3-5 steps with a railing? : A Little 6 Click Score: 18    End of Session Equipment Utilized During Treatment: Gait belt Activity Tolerance: Patient tolerated treatment well   Nurse Communication: Mobility status PT Visit Diagnosis: Unsteadiness on feet (R26.81);Other abnormalities of gait and mobility (R26.89);Pain Pain - Right/Left: Left Pain - part of body: Knee    Time: 1110-1146 PT Time Calculation (min) (ACUTE ONLY): 36 min   Charges:   PT Evaluation $PT Eval Low Complexity: 1 Low PT Treatments $Gait Training: 8-22 mins   PT G Codes:        Etta Grandchild, PT, DPT Acute Rehab Services Pager: [phone]    Etta Grandchild 10/17/2017, 11:57 AM

## 2017-10-17 NOTE — Progress Notes (Signed)
Ortho Tech paged and returned in regards for order- Bledsoe knee brace.

## 2017-10-17 NOTE — Progress Notes (Signed)
Subjective: 1 Day Post-Op Procedure(s) (LRB): STEROID INJECTION (Left) TOTAL KNEE REVISION (Left) Patient reports pain as moderate.    Objective: Vital signs in last 24 hours: Temp:  [97.7 F (36.5 C)-98 F (36.7 C)] 98 F (36.7 C) (03/06 0521) Pulse Rate:  [81-102] 102 (03/06 0521) Resp:  [9-21] 19 (03/06 0521) BP: (109-137)/(63-86) 122/70 (03/06 0521) SpO2:  [95 %-100 %] 96 % (03/06 0521) Weight:  [175 lb (79.4 kg)] 175 lb (79.4 kg) (03/05 1045)  Intake/Output from previous day: 03/05 0701 - 03/06 0700 In: 3070 [P.O.:720; I.V.:2100; IV Piggyback:250] Out: 1450 [Urine:1150; Blood:300] Intake/Output this shift: No intake/output data recorded.  No results for input(s): HGB in the last 72 hours. No results for input(s): WBC, RBC, HCT, PLT in the last 72 hours. No results for input(s): NA, K, CL, CO2, BUN, CREATININE, GLUCOSE, CALCIUM in the last 72 hours. No results for input(s): LABPT, INR in the last 72 hours.  Sensation intact distally Intact pulses distally Dorsiflexion/Plantar flexion intact Incision: dressing C/D/I Compartment soft  Assessment/Plan: 1 Day Post-Op Procedure(s) (LRB): STEROID INJECTION (Left) TOTAL KNEE REVISION (Left) Up with therapy - keep knee extended and only up to touch down weight bearing on left leg.  [name] 10/17/2017, 7:30 AM

## 2017-10-18 ENCOUNTER — Encounter (HOSPITAL_COMMUNITY): Payer: Self-pay | Admitting: Orthopaedic Surgery

## 2017-10-18 LAB — CBC
HCT: 24.2 % — ABNORMAL LOW (ref 39.0–52.0)
Hemoglobin: 8.5 g/dL — ABNORMAL LOW (ref 13.0–17.0)
MCH: 31.5 pg (ref 26.0–34.0)
MCHC: 35.1 g/dL (ref 30.0–36.0)
MCV: 89.6 fL (ref 78.0–100.0)
Platelets: 143 K/uL — ABNORMAL LOW (ref 150–400)
RBC: 2.7 MIL/uL — ABNORMAL LOW (ref 4.22–5.81)
RDW: 14.3 % (ref 11.5–15.5)
WBC: 7.6 K/uL (ref 4.0–10.5)

## 2017-10-18 MED ORDER — OXYCODONE HCL 5 MG PO TABS: 5.0000 mg | ORAL_TABLET | ORAL | 0 refills | Status: DC | PRN

## 2017-10-18 MED ORDER — RIVAROXABAN 10 MG PO TABS: 10.0000 mg | ORAL_TABLET | Freq: Every day | ORAL | 0 refills | Status: AC

## 2017-10-18 MED ORDER — CYCLOBENZAPRINE HCL 10 MG PO TABS: 10.0000 mg | ORAL_TABLET | Freq: Three times a day (TID) | ORAL | 0 refills | Status: AC | PRN

## 2017-10-18 NOTE — Progress Notes (Signed)
[name] to be D/C'd Home per MD order.  Discussed prescriptions and follow up appointments with the patient. Prescriptions given to patient, medication list explained in detail. Pt verbalized understanding.  Allergies as of 10/18/2017      Reactions   Penicillins Rash, Other (See Comments)   Has patient had a PCN reaction causing immediate rash, facial/tongue/throat swelling, SOB or lightheadedness with hypotension: No Has patient had a PCN reaction causing severe rash involving mucus membranes or skin necrosis: No- around scars.  Has patient had a PCN reaction that required hospitalization No Has patient had a PCN reaction occurring within the last 10 years: No If all of the above answers are "NO", then may proceed with Cephalosporin use.      Medication List    STOP taking these medications   diclofenac 75 MG EC tablet Commonly known as:  VOLTAREN   ibuprofen 200 MG tablet Commonly known as:  ADVIL,MOTRIN     TAKE these medications   acetaminophen 500 MG tablet Commonly known as:  TYLENOL Take 500 mg by mouth every 6 (six) hours as needed for moderate pain or headache.   atorvastatin 40 MG tablet Commonly known as:  LIPITOR Take 40 mg by mouth at bedtime.   cyclobenzaprine 10 MG tablet Commonly known as:  FLEXERIL Take 1 tablet (10 mg total) by mouth 3 (three) times daily as needed for muscle spasms.   DULoxetine 60 MG capsule Commonly known as:  CYMBALTA Take 60 mg by mouth 2 (two) times daily.   folic acid 1 MG tablet Commonly known as:  FOLVITE Take 1 mg by mouth daily.   lisinopril-hydrochlorothiazide 20-12.5 MG tablet Commonly known as:  PRINZIDE,ZESTORETIC Take 1 tablet by mouth daily.   methotrexate 2.5 MG tablet Commonly known as:  RHEUMATREX Take 15 mg by mouth every Sunday.   methylphenidate 20 MG tablet Commonly known as:  RITALIN Take 20 mg by mouth 2 (two) times daily with breakfast and lunch.   oxyCODONE 5 MG immediate release  tablet Commonly known as:  Oxy IR/ROXICODONE Take 1-2 tablets (5-10 mg total) by mouth every 4 (four) hours as needed for severe pain. What changed:  when to take this   pantoprazole 20 MG tablet Commonly known as:  PROTONIX Take 20 mg by mouth daily.   pramipexole 1 MG tablet Commonly known as:  MIRAPEX Take 1 mg by mouth 3 (three) times daily.   rivaroxaban 10 MG Tabs tablet Commonly known as:  XARELTO Take 1 tablet (10 mg total) by mouth daily with breakfast. Start taking on:  10/19/2017   tamsulosin 0.4 MG Caps capsule Commonly known as:  FLOMAX Take 0.4 mg by mouth daily after breakfast.            Durable Medical Equipment  (From admission, onward)        Start     Ordered   10/16/17 1955  DME 3 n 1  Once     03 /05/19 1954   10/16/17 1955  DME Walker rolling  Once    Question:  Patient needs a walker to treat with the following condition  Answer:  Status post revision of total knee, left   10/16/17 1954      Vitals:   10/17/17 2100 10/18/17 0500  BP: (!) 129/55 124/64  Pulse: (!) 107 (!) 104  Resp: 16 16  Temp: 98.1 F (36.7 C) 98.5 F (36.9 C)  SpO2: 94% 93%    Skin clean, dry and intact without evidence  of skin break down, no evidence of skin tears noted. Aquacel dressing, clean, dry, and intact on left knee with brace in place prior to discharge. IV catheter discontinued intact. Site without signs and symptoms of complications. Dressing and pressure applied. Pt denies pain at this time. No complaints noted.  An After Visit Summary and prescriptions were printed and given to the patient. Patient escorted via WC, and D/C home via private auto.  [place] Chrsitopher Wik RN

## 2017-10-18 NOTE — Progress Notes (Signed)
Physical Therapy Treatment Patient Details Name: [name] MRN: [id number] DOB: July 23, 1956 Today's Date: 10/18/2017    History of Present Illness 62 y.o. male s/p L Total Knee Revision after aseptic loosening 3/05. PMH includes: HTN, RA, OCD, GERD, ADD, L TKA 2016, RTC repair 2008, lumbar fusion.     PT Comments    Patient progressing well with therapy. Supervision level for OOB mobility, and introduced stair training this visit. Plan to see tomorrow AM before d/c to reinforce stairs, no concerns for safety at home at this point in time.     Follow Up Recommendations  Home health PT;Supervision for mobility/OOB     Equipment Recommendations  None recommended by PT    Recommendations for Other Services       Precautions / Restrictions Precautions Precautions: Fall Restrictions Weight Bearing Restrictions: Yes LLE Weight Bearing: Touchdown weight bearing    Mobility  Bed Mobility Overal bed mobility: Modified Independent             General bed mobility comments: HOB elevated, no physical assist  Transfers Overall transfer level: Needs assistance Equipment used: Rolling walker (2 wheeled) Transfers: Sit to/from Stand Sit to Stand: Supervision         General transfer comment: for safety, good hand placement and technique  Ambulation/Gait Ambulation/Gait assistance: Min guard;Supervision Ambulation Distance (Feet): 50 Feet Assistive device: Rolling walker (2 wheeled) Gait Pattern/deviations: Step-to pattern Gait velocity: decreased   General Gait Details: Good adherence to NWB status. L shoulder hurts but is tolerable. limited ambulation due to shoulder pain. progressed to supervision with hop to pattern in RW.   Stairs Stairs: Yes   Stair Management: Backwards;No rails;With walker Number of Stairs: 8 General stair comments: Cues for sequencing and technique, pt able to perform with adherence to NWB status using RW. pt more comfortable with scooting  on bottom at this time as he is unsure who will be assisting him inside at this time.   Wheelchair Mobility    Modified Rankin (Stroke Patients Only)       Balance Overall balance assessment: Needs assistance Sitting-balance support: Feet supported;No upper extremity supported Sitting balance-Leahy Scale: Good     Standing balance support: Bilateral upper extremity supported Standing balance-Leahy Scale: Fair Standing balance comment: UE for support due to NWB status                            Cognition Arousal/Alertness: Awake/alert Behavior During Therapy: WFL for tasks assessed/performed Overall Cognitive Status: Within Functional Limits for tasks assessed                                        Exercises      General Comments        Pertinent Vitals/Pain Pain Assessment: Faces Pain Score: 4  Pain Location: L knee Pain Descriptors / Indicators: Discomfort;Sore    Home Living Family/patient expects to be discharged to:: Private residence Living Arrangements: Spouse/significant other Available Help at Discharge: Family;Available PRN/intermittently Type of Home: House Home Access: Stairs to enter            Prior Function            PT Goals (current goals can now be found in the care plan section) Acute Rehab PT Goals PT Goal Formulation: With patient Time For Goal Achievement: 10/24/17 Potential to Achieve Goals:  Good    Frequency    7X/week      PT Plan Current plan remains appropriate    Co-evaluation              AM-PAC PT "6 Clicks" Daily Activity  Outcome Measure  Difficulty turning over in bed (including adjusting bedclothes, sheets and blankets)?: A Little Difficulty moving from lying on back to sitting on the side of the bed? : A Little Difficulty sitting down on and standing up from a chair with arms (e.g., wheelchair, bedside commode, etc,.)?: A Little Help needed moving to and from a bed to  chair (including a wheelchair)?: A Little Help needed walking in hospital room?: A Little Help needed climbing 3-5 steps with a railing? : A Little 6 Click Score: 18    End of Session Equipment Utilized During Treatment: Gait belt Activity Tolerance: Patient tolerated treatment well   Nurse Communication: Mobility status PT Visit Diagnosis: Unsteadiness on feet (R26.81);Other abnormalities of gait and mobility (R26.89);Pain Pain - Right/Left: Left Pain - part of body: Knee     Time: 0932-6712 PT Time Calculation (min) (ACUTE ONLY): 17 min  Charges:  $Gait Training: 8-22 mins                    G Codes:       Etta Grandchild, PT, DPT Acute Rehab Services Pager: [phone]     Etta Grandchild 10/18/2017, 9:24 AM

## 2017-10-18 NOTE — Progress Notes (Signed)
Subjective: 2 Days Post-Op Procedure(s) (LRB): STEROID INJECTION (Left) TOTAL KNEE REVISION (Left) Patient reports pain as moderate.  Acute blood loss anemia from surgery, but asymptomatic.  Objective: Vital signs in last 24 hours: Temp:  [98.1 F (36.7 C)-98.5 F (36.9 C)] 98.5 F (36.9 C) (03/07 0500) Pulse Rate:  [104-116] 104 (03/07 0500) Resp:  [16-17] 16 (03/07 0500) BP: (117-129)/(55-65) 124/64 (03/07 0500) SpO2:  [93 %-98 %] 93 % (03/07 0500)  Intake/Output from previous day: 03/06 0701 - 03/07 0700 In: 836 [P.O.:836] Out: 1450 [Urine:1450] Intake/Output this shift: Total I/O In: 480 [P.O.:480] Out: -   Recent Labs    10/17/17 0859 10/18/17 0456  HGB 10.0* 8.5*   Recent Labs    10/17/17 0859 10/18/17 0456  WBC 10.8* 7.6  RBC 3.35* 2.70*  HCT 29.7* 24.2*  PLT 222 143*   Recent Labs    10/17/17 0859  NA 136  K 3.7  CL 98*  CO2 28  BUN 22*  CREATININE 0.94  GLUCOSE 122*  CALCIUM 8.6*   No results for input(s): LABPT, INR in the last 72 hours.  Sensation intact distally Intact pulses distally Dorsiflexion/Plantar flexion intact Incision: dressing C/D/I No cellulitis present Compartment soft  Assessment/Plan: 2 Days Post-Op Procedure(s) (LRB): STEROID INJECTION (Left) TOTAL KNEE REVISION (Left) Discharge home with home health this afternoon.  Kathryne Hitch 10/18/2017, 11:11 AM

## 2017-10-18 NOTE — Discharge Summary (Signed)
Patient ID: [name] MRN: [id number] DOB/AGE: Apr 18, 1956 62 y.o.  Admit date: 10/16/2017 Discharge date: 10/18/2017  Admission Diagnoses:  Principal Problem:   Loose left total knee arthroplasty [facility]) Active Problems:   Status post revision of total knee, left   Discharge Diagnoses:  Same  Past Medical History:  Diagnosis Date  . ADD (attention deficit disorder)    takes Ritalin daily  . Anemia    denies  . Anxiety   . Arthritis    RA, OA, - shoulder, spine, knee   . Cataract    left eye  . Depression    takes Cymbalta daily, OCD  . Enlarged prostate    takes Flomax daily   . GERD (gastroesophageal reflux disease)    takes Omeprazole daily  . Headache    hx of migraines-none recent  . History of blood transfusion   . History of kidney stones    x1 15 years ago  . Hypertension   . Numbness and tingling    fingers bilat comes and goes   . OCD (obsessive compulsive disorder)   . Restless leg syndrome    takes requip daily  . Rheumatoid arthritis(714.0)    sees Dr Sharmon Revere @ [facility] in [place]  . Wears glasses     Surgeries: Procedure(s): STEROID INJECTION TOTAL KNEE REVISION on 10/16/2017   Consultants:   Discharged Condition: Improved  Hospital Course: [name] is an 62 y.o. male who was admitted 10/16/2017 for operative treatment ofLoose total knee arthroplasty (HCC). Patient has severe unremitting pain that affects sleep, daily activities, and work/hobbies. After pre-op clearance the patient was taken to the operating room on 10/16/2017 and underwent  Procedure(s): STEROID INJECTION TOTAL KNEE REVISION.    Patient was given perioperative antibiotics:  Anti-infectives (From admission, onward)   Start     Dose/Rate Route Frequency Ordered Stop   10/16/17 2030  clindamycin (CLEOCIN) IVPB 600 mg     600 mg 100 mL/hr over 30 Minutes Intravenous Every 6 hours 10/16/17 1954 10/17/17 0536   10/16/17 1130  clindamycin (CLEOCIN) IVPB 900 mg     900  mg 100 mL/hr over 30 Minutes Intravenous To ShortStay Surgical 10/15/17 1242 10/16/17 1410       Patient was given sequential compression devices, early ambulation, and chemoprophylaxis to prevent DVT.  Patient benefited maximally from hospital stay and there were no complications.    Recent vital signs:  Patient Vitals for the past 24 hrs:  BP Temp Temp src Pulse Resp SpO2  10/18/17 0500 124/64 98.5 F (36.9 C) Oral (!) 104 16 93 %  10/17/17 2100 (!) 129/55 98.1 F (36.7 C) Oral (!) 107 16 94 %  10/17/17 1513 117/65 98.2 F (36.8 C) Oral (!) 116 17 98 %     Recent laboratory studies:  Recent Labs    10/17/17 0859 10/18/17 0456  WBC 10.8* 7.6  HGB 10.0* 8.5*  HCT 29.7* 24.2*  PLT 222 143*  NA 136  --   K 3.7  --   CL 98*  --   CO2 28  --   BUN 22*  --   CREATININE 0.94  --   GLUCOSE 122*  --   CALCIUM 8.6*  --      Discharge Medications:   Allergies as of 10/18/2017      Reactions   Penicillins Rash, Other (See Comments)   Has patient had a PCN reaction causing immediate rash, facial/tongue/throat swelling, SOB or lightheadedness with hypotension: No Has patient  had a PCN reaction causing severe rash involving mucus membranes or skin necrosis: No- around scars.  Has patient had a PCN reaction that required hospitalization No Has patient had a PCN reaction occurring within the last 10 years: No If all of the above answers are "NO", then may proceed with Cephalosporin use.      Medication List    STOP taking these medications   diclofenac 75 MG EC tablet Commonly known as:  VOLTAREN   ibuprofen 200 MG tablet Commonly known as:  ADVIL,MOTRIN     TAKE these medications   acetaminophen 500 MG tablet Commonly known as:  TYLENOL Take 500 mg by mouth every 6 (six) hours as needed for moderate pain or headache.   atorvastatin 40 MG tablet Commonly known as:  LIPITOR Take 40 mg by mouth at bedtime.   cyclobenzaprine 10 MG tablet Commonly known as:   FLEXERIL Take 1 tablet (10 mg total) by mouth 3 (three) times daily as needed for muscle spasms.   DULoxetine 60 MG capsule Commonly known as:  CYMBALTA Take 60 mg by mouth 2 (two) times daily.   folic acid 1 MG tablet Commonly known as:  FOLVITE Take 1 mg by mouth daily.   lisinopril-hydrochlorothiazide 20-12.5 MG tablet Commonly known as:  PRINZIDE,ZESTORETIC Take 1 tablet by mouth daily.   methotrexate 2.5 MG tablet Commonly known as:  RHEUMATREX Take 15 mg by mouth every Sunday.   methylphenidate 20 MG tablet Commonly known as:  RITALIN Take 20 mg by mouth 2 (two) times daily with breakfast and lunch.   oxyCODONE 5 MG immediate release tablet Commonly known as:  Oxy IR/ROXICODONE Take 1-2 tablets (5-10 mg total) by mouth every 4 (four) hours as needed for severe pain. What changed:  when to take this   pantoprazole 20 MG tablet Commonly known as:  PROTONIX Take 20 mg by mouth daily.   pramipexole 1 MG tablet Commonly known as:  MIRAPEX Take 1 mg by mouth 3 (three) times daily.   rivaroxaban 10 MG Tabs tablet Commonly known as:  XARELTO Take 1 tablet (10 mg total) by mouth daily with breakfast. Start taking on:  10/19/2017   tamsulosin 0.4 MG Caps capsule Commonly known as:  FLOMAX Take 0.4 mg by mouth daily after breakfast.            Durable Medical Equipment  (From admission, onward)        Start     Ordered   10/16/17 1955  DME 3 n 1  Once     03 /05/19 1954   10/16/17 1955  DME Walker rolling  Once    Question:  Patient needs a walker to treat with the following condition  Answer:  Status post revision of total knee, left   10/16/17 1954      Diagnostic Studies: Dg Knee Left Port  Result Date: 10/16/2017 CLINICAL DATA:  62 year old male post revision left knee prosthesis. Initial encounter. EXAM: PORTABLE LEFT KNEE - 1-2 VIEW COMPARISON:  03/20/2016 plain film exam. FINDINGS: Post revision left knee prosthesis. Tibial component unremarkable.  Femoral component not completely included on present exam. Cerclage wires utilized distal femoral shaft level. Tiny radiopaque structure medial aspect without surrounding fracture noted. IMPRESSION: Post revision left knee prosthesis. Tibial component unremarkable. Femoral component not completely included on present exam. Electronically Signed   By: 05/20/2016 M.D.   On: 10/16/2017 19:45    Disposition: 01-Home or Self Care    Follow-up Information  Kathryne Hitch, MD Follow up in 2 week(s).   Specialty:  Orthopedic Surgery Contact information: [address] [place] [address] [phone]            Signed: Kathryne Hitch 10/18/2017, 11:14 AM

## 2017-10-21 LAB — AEROBIC/ANAEROBIC CULTURE W GRAM STAIN (SURGICAL/DEEP WOUND): Gram Stain: NONE SEEN

## 2017-10-21 LAB — AEROBIC/ANAEROBIC CULTURE (SURGICAL/DEEP WOUND): CULTURE: NO GROWTH

## 2017-10-22 ENCOUNTER — Telehealth (INDEPENDENT_AMBULATORY_CARE_PROVIDER_SITE_OTHER): Payer: Self-pay | Admitting: Orthopaedic Surgery

## 2017-10-22 NOTE — Telephone Encounter (Signed)
Patient called asked if Dr Magnus Ivan will write him a Rx for a wheelchair with leg extensions on it. The number  To contact patient is [phone]

## 2017-10-22 NOTE — Telephone Encounter (Signed)
Please advise 

## 2017-10-22 NOTE — Telephone Encounter (Signed)
LMOM for patient letting him know I faxed this to [facility]

## 2017-10-22 NOTE — Telephone Encounter (Signed)
Please write that script

## 2017-10-23 ENCOUNTER — Telehealth (INDEPENDENT_AMBULATORY_CARE_PROVIDER_SITE_OTHER): Payer: Self-pay | Admitting: Orthopaedic Surgery

## 2017-10-23 NOTE — Telephone Encounter (Signed)
Patient aware Rx was sent I confirmed with [facility] that they received it before I talked to him  They said they will call patient and let him know to come pick it up

## 2017-10-23 NOTE — Telephone Encounter (Signed)
Patient called stating that [facility] received the prescription for the wheelchair but [facility] told the patient that there was an additional two forms that needed to go with the prescription but have not received them.  Thank you.

## 2017-10-23 NOTE — Telephone Encounter (Signed)
Completed and faxed to [facility]

## 2017-10-23 NOTE — Telephone Encounter (Signed)
Patient states the [facility] did receive the order for the wheelchair, but they state the are still missing something. Patient not sure what & request that someone call [facility] & him asap.

## 2017-10-29 ENCOUNTER — Encounter (HOSPITAL_COMMUNITY): Payer: Self-pay | Admitting: Orthopaedic Surgery

## 2017-10-29 NOTE — Addendum Note (Signed)
Addendum  created 10/29/17 2022 by Dorris Singh, MD   Intraprocedure Event edited

## 2017-10-30 ENCOUNTER — Encounter (INDEPENDENT_AMBULATORY_CARE_PROVIDER_SITE_OTHER): Payer: Self-pay

## 2017-10-30 ENCOUNTER — Ambulatory Visit (INDEPENDENT_AMBULATORY_CARE_PROVIDER_SITE_OTHER): Payer: Medicare HMO | Admitting: Physician Assistant

## 2017-10-30 ENCOUNTER — Encounter (INDEPENDENT_AMBULATORY_CARE_PROVIDER_SITE_OTHER): Payer: Self-pay | Admitting: Physician Assistant

## 2017-10-30 ENCOUNTER — Ambulatory Visit (INDEPENDENT_AMBULATORY_CARE_PROVIDER_SITE_OTHER): Payer: Medicare HMO

## 2017-10-30 DIAGNOSIS — Z96652 Presence of left artificial knee joint: Secondary | ICD-10-CM

## 2017-10-30 MED ORDER — OXYCODONE HCL 5 MG PO TABS: 5.0000 mg | ORAL_TABLET | ORAL | 0 refills | Status: DC | PRN

## 2017-10-30 NOTE — Progress Notes (Signed)
Office Visit Note   Patient: [name]           Date of Birth: [birth year]           MRN: [id number] Visit Date: 10/30/2017              Requested by: Macy Mis, MD [address] [place], [place] [address] PCP: Macy Mis, MD   Assessment & Plan: Visit Diagnoses:  1. S/P revision of total knee, left     Plan: We will keep him in a Bledsoe knee brace locked out at full extension.  He is touchdown weightbearing on the left leg.  Staples were removed today Steri-Strips applied.  He can get the wound wet in the shower and washing with antibacterial soap.  We will see him back in 2 weeks to check his wound and begin some flexion.   Follow-Up Instructions: Return in about 2 weeks (around 11/13/2017).   Orders:  Orders Placed This Encounter  Procedures  . XR Knee 1-2 Views Left   Meds ordered this encounter  Medications  . oxyCODONE (OXY IR/ROXICODONE) 5 MG immediate release tablet    Sig: Take 1-2 tablets (5-10 mg total) by mouth every 4 (four) hours as needed for severe pain.    Dispense:  60 tablet    Refill:  0      Procedures: No procedures performed   Clinical Data: No additional findings.   Subjective: Chief Complaint  Patient presents with  . Left Knee - Routine Post Op    HPI [name] returns today 2 weeks status post revision left total knee arthroplasty.  His surgery was complicated by fracture of the medial femoral condyle which really required additional fixation to the distal femur using tables.  He has been touchdown weightbearing in a Bledsoe brace locked out in full extension.  He is on Xarelto.  He has had no shortness of breath fevers chills. Review of Systems See HPI otherwise negative  Objective: Vital Signs: There were no vitals taken for this visit.  Physical Exam  Constitutional: He is oriented to person, place, and time. He appears well-developed and well-nourished. No distress.  Cardiovascular: Intact  distal pulses.  Pulmonary/Chest: Effort normal.  Neurological: He is alert and oriented to person, place, and time.  Skin: He is not diaphoretic.    Ortho Exam Left knee surgical incisions healing well no signs of infection.  Staples well approximate the incision.  Left calf is supple nontender. Specialty Comments:  No specialty comments available.  Imaging: Xr Knee 1-2 Views Left  Result Date: 10/30/2017 Left knee 2 views: Status post left total knee revision no evidence of loosening of hardware.  No change in overall position of the components.  2 cables are present distal femur shaft and are unchanged in overall position alignment.  There is no acute fracture is noted.    PMFS History: Patient Active Problem List   Diagnosis Date Noted  . Loose left total knee arthroplasty (HCC) 10/16/2017  . Status post revision of total knee, left 10/16/2017  . Lumbar stenosis with neurogenic claudication 10/30/2016  . 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 Status post revision of total replacement of left knee 09/20/2015  . 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 Effusion of left knee joint; questionable prosthetic joint infection 05/07/2015  . Knee effusion 05/07/2015  . Status post total  left knee replacement 01/08/2015  . Osteoarthritis of left knee 12/25/2014  . Lumbar spondylosis L2-5 09/14/2012   Past Medical History:  Diagnosis Date  . ADD (attention deficit disorder)    takes Ritalin daily  . Anemia    denies  . Anxiety   . Arthritis    RA, OA, - shoulder, spine, knee   . Cataract    left eye  . Depression    takes Cymbalta daily, OCD  . Enlarged prostate    takes Flomax daily   . GERD (gastroesophageal reflux disease)    takes Omeprazole daily  . Headache    hx of migraines-none recent  . History of blood transfusion   . History of kidney  stones    x1 15 years ago  . Hypertension   . Numbness and tingling    fingers bilat comes and goes   . OCD (obsessive compulsive disorder)   . Restless leg syndrome    takes requip daily  . Rheumatoid arthritis(714.0)    sees Dr Sharmon Revere @ [facility] in [place]  . Wears glasses     Family History  Problem Relation Age of Onset  . CAD Father   . Alzheimer's disease Father     Past Surgical History:  Procedure Laterality Date  . ABDOMINAL EXPOSURE  09/13/2012   Procedure: ABDOMINAL EXPOSURE;  Surgeon: Larina Earthly, MD;  Location: [facility];  Service: Vascular;  Laterality: N/A;  . ANTERIOR LAT LUMBAR FUSION  09/13/2012   Procedure: ANTERIOR LATERAL LUMBAR FUSION 2 LEVELS;  Surgeon: Reinaldo Meeker, MD;  Location: [facility];  Service: Neurosurgery;  Laterality: Right;  lumbar two three,three-four  . ANTERIOR LUMBAR FUSION  09/13/2012   Procedure: ANTERIOR LUMBAR FUSION 1 LEVEL;  Surgeon: Reinaldo Meeker, MD;  Location: [facility];  Service: Neurosurgery;  Laterality: N/A;  lumbar four-five  . EXCISIONAL TOTAL KNEE ARTHROPLASTY WITH ANTIBIOTIC SPACERS Left 08/06/2015   Procedure: EXCISION OF LEFT TOTAL KNEE ARTHROPLASTY WITH PLACEMENT OF ANTIBIOTIC SPACERS;  Surgeon: Kathryne Hitch, MD;  Location: [facility];  Service: Orthopedics;  Laterality: Left;  . HAND TENDON SURGERY Right 06/2012   done @ [facility] in [place]  . I&D KNEE WITH POLY EXCHANGE Left 05/07/2015   Procedure: IRRIGATION AND DEBRIDEMENT LEFT KNEE WITH POLY EXCHANGE;  Surgeon: Kathryne Hitch, MD;  Location: [facility];  Service: Orthopedics;  Laterality: Left;  . KNEE ARTHROSCOPY Left 06/22/2015   Procedure: LEFT KNEE ARTHROSCOPY WITH DEBRIDEMENT, SYNOVECTOMY;  Surgeon: Kathryne Hitch, MD;  Location: [facility] OR;  Service: Orthopedics;  Laterality: Left;  . LUMBAR PERCUTANEOUS PEDICLE SCREW 2 LEVEL  09/13/2012   Procedure: LUMBAR PERCUTANEOUS PEDICLE SCREW 2 LEVEL;  Surgeon: Reinaldo Meeker, MD;  Location: [facility];  Service: Neurosurgery;  Laterality: Left;  left pedicle screws two -three,three-four  . ROTATOR CUFF REPAIR Right 2008    done in [facility]  . SHOULDER ARTHROSCOPY Right 10/02/2016  . STERIOD INJECTION Left 10/16/2017   Procedure: STEROID INJECTION;  Surgeon: Kathryne Hitch, MD;  Location: [facility] OR;  Service: Orthopedics;  Laterality: Left;  . TOTAL HIP ARTHROPLASTY Right 01/2012   done in [facility]  . TOTAL KNEE ARTHROPLASTY Left 01/08/2015   Procedure: LEFT TOTAL KNEE ARTHROPLASTY;  Surgeon: Kathryne Hitch, MD;  Location: [facility];  Service: Orthopedics;  Laterality: Left;  . TOTAL KNEE REVISION Left 09/20/2015   Procedure: RE-IMPLANT/REVISION LEFT TOTAL KNEE ARTHROPLASTY;  Surgeon: Kathryne Hitch, MD;  Location: [facility]  OR;  Service: Orthopedics;  Laterality: Left;  . TOTAL KNEE REVISION Left 10/16/2017   Procedure: TOTAL KNEE REVISION;  Surgeon: Kathryne Hitch, MD;  Location: [facility] OR;  Service: Orthopedics;  Laterality: Left;   Social History   Occupational History  . Not on file  Tobacco Use  . Smoking status: Never Smoker  . Smokeless tobacco: Never Used  Substance and Sexual Activity  . Alcohol use: Yes    Comment: rarely   . Drug use: No  . Sexual activity: Not on file

## 2017-11-02 ENCOUNTER — Encounter (HOSPITAL_COMMUNITY): Payer: Self-pay | Admitting: Orthopaedic Surgery

## 2017-11-12 ENCOUNTER — Encounter (INDEPENDENT_AMBULATORY_CARE_PROVIDER_SITE_OTHER): Payer: Self-pay | Admitting: Physician Assistant

## 2017-11-12 ENCOUNTER — Ambulatory Visit (INDEPENDENT_AMBULATORY_CARE_PROVIDER_SITE_OTHER): Payer: Medicare HMO | Admitting: Physician Assistant

## 2017-11-12 ENCOUNTER — Encounter (INDEPENDENT_AMBULATORY_CARE_PROVIDER_SITE_OTHER): Payer: Self-pay

## 2017-11-12 ENCOUNTER — Ambulatory Visit (INDEPENDENT_AMBULATORY_CARE_PROVIDER_SITE_OTHER): Payer: Medicare HMO

## 2017-11-12 DIAGNOSIS — Z96652 Presence of left artificial knee joint: Secondary | ICD-10-CM

## 2017-11-12 NOTE — Progress Notes (Signed)
HPI: Mr. returns today follow-up 27 days status post revisionCiaschi left total knee.  He states he is doing okay continues to having some achiness in the knee.  He is finished up with Xarelto.  He is taking oxycodone as needed.  He remains in a Bledsoe brace locked at full extension and touchdown weightbearing is actually been placed in a little bit more weight than that.   Physical exam: Full extension flexion to approximately 50 degrees.  Calf supple.  Surgical incision without any signs of infection wound dehiscence.  Radiographs:2 views left knee: No acute fracture.  No hardware failure.  Overall excellent alignment of the distal femur fracture.  Impression: 27 days status post left total knee revision  Plan: He will work on range of motion not bending past 90 degrees.  He can be weightbearing as tolerated outside of the hinged Bledsoe brace.  He will transition from 2 crutches to one crutch to no crutches or assistive device as tolerated.  We will see him back in 3 weeks for wound check no x-rays at that time.

## 2017-11-26 ENCOUNTER — Other Ambulatory Visit (INDEPENDENT_AMBULATORY_CARE_PROVIDER_SITE_OTHER): Payer: Self-pay | Admitting: Orthopaedic Surgery

## 2017-11-26 NOTE — Telephone Encounter (Signed)
Please advise 

## 2017-12-03 ENCOUNTER — Telehealth (INDEPENDENT_AMBULATORY_CARE_PROVIDER_SITE_OTHER): Payer: Self-pay | Admitting: Orthopaedic Surgery

## 2017-12-03 ENCOUNTER — Ambulatory Visit (INDEPENDENT_AMBULATORY_CARE_PROVIDER_SITE_OTHER): Payer: Medicare HMO | Admitting: Orthopaedic Surgery

## 2017-12-03 ENCOUNTER — Encounter (INDEPENDENT_AMBULATORY_CARE_PROVIDER_SITE_OTHER): Payer: Self-pay | Admitting: Orthopaedic Surgery

## 2017-12-03 DIAGNOSIS — Z96652 Presence of left artificial knee joint: Secondary | ICD-10-CM

## 2017-12-03 NOTE — Telephone Encounter (Signed)
Patient states he's already filled out a [employer] form, just wanted to give you the fax number [phone] to Rosalva Ferron   Phone # [phone]

## 2017-12-03 NOTE — Progress Notes (Signed)
The patient is now 6-7 weeks status post revision arthroplasty of the left knee.  He saying he is feeling the best he is felt in a while.  He is not having significant issues.  He does have pain and swelling at the end of the day.  On exam is no significant effusion of his left knee.  This is  the least swelling that have seen in some time for him.  He has full extension his flexion past 90 degrees.  I need him to still stay out of work for the next 6 weeks as we continue to rehabilitate his knee.  I would like to see him back in 6 weeks with a repeat AP and lateral of his left knee.  All questions concerns were answered and addressed.

## 2018-01-01 ENCOUNTER — Ambulatory Visit (INDEPENDENT_AMBULATORY_CARE_PROVIDER_SITE_OTHER): Payer: Medicare HMO | Admitting: Orthopaedic Surgery

## 2018-01-01 ENCOUNTER — Encounter (INDEPENDENT_AMBULATORY_CARE_PROVIDER_SITE_OTHER): Payer: Self-pay | Admitting: Orthopaedic Surgery

## 2018-01-01 ENCOUNTER — Ambulatory Visit (INDEPENDENT_AMBULATORY_CARE_PROVIDER_SITE_OTHER): Payer: Medicare HMO

## 2018-01-01 ENCOUNTER — Encounter (INDEPENDENT_AMBULATORY_CARE_PROVIDER_SITE_OTHER): Payer: Self-pay

## 2018-01-01 DIAGNOSIS — Z96652 Presence of left artificial knee joint: Secondary | ICD-10-CM

## 2018-01-01 MED ORDER — OXYCODONE HCL 5 MG PO TABS: 5.0000 mg | ORAL_TABLET | ORAL | 0 refills | Status: AC | PRN

## 2018-01-01 NOTE — Progress Notes (Signed)
The patient is almost 3 months now status post revision arthroplasty of the left total knee that is become aseptically loose.  He still showed no evidence of infection.  This is a complicated surgery we did have to cable the femur.  He is now been weightbearing as tolerated and doing well overall.  He does have a achy pain but denies any significant swelling.  On examination of his knee his range of motion is full the knee feels ligamentously stable and there is no significant swelling or redness.  2 views of the knee show well-seated implant with no complicating features.  This morning continue increase activities but is slow.  I still feel that he needs to stay out of work until July 8 when he can return to full work all questions concerns were answered and addressed.  We will see him back in 6 months to see how he is doing overall with a repeat 2 views of his left knee.

## 2018-01-14 ENCOUNTER — Ambulatory Visit (INDEPENDENT_AMBULATORY_CARE_PROVIDER_SITE_OTHER): Payer: Medicare HMO | Admitting: Orthopaedic Surgery

## 2018-05-02 ENCOUNTER — Ambulatory Visit (INDEPENDENT_AMBULATORY_CARE_PROVIDER_SITE_OTHER): Payer: Medicare Other

## 2018-05-02 ENCOUNTER — Encounter (INDEPENDENT_AMBULATORY_CARE_PROVIDER_SITE_OTHER): Payer: Self-pay | Admitting: Orthopaedic Surgery

## 2018-05-02 ENCOUNTER — Ambulatory Visit (INDEPENDENT_AMBULATORY_CARE_PROVIDER_SITE_OTHER): Payer: Medicare Other | Admitting: Orthopaedic Surgery

## 2018-05-02 DIAGNOSIS — Z96652 Presence of left artificial knee joint: Secondary | ICD-10-CM | POA: Diagnosis not present

## 2018-05-02 NOTE — Progress Notes (Signed)
The patient is now 2-1/2-year status post revision arthroplasty of a left total knee replacement.  He has had an excision arthroplasty due to infection.  He says that he is doing well.  He does grind for him but it does not swell and is not severe pain with his left knee.  On examination there is no effusion of his left knee his incisions well-healed.  The knee is cool.  He feels ligaments is stable and his range of motion is full.  There is grinding at the patellofemoral joint.  2 views of the left knee show a stable revision arthroplasty.  At this point will follow-up as needed since his been 2-1/2 years since his original surgery and things are doing well.  If he has any issues at all he knows to give [place] a call.

## 2018-07-02 ENCOUNTER — Ambulatory Visit (INDEPENDENT_AMBULATORY_CARE_PROVIDER_SITE_OTHER): Payer: Medicare HMO | Admitting: Orthopaedic Surgery

## 2018-12-10 ENCOUNTER — Telehealth (INDEPENDENT_AMBULATORY_CARE_PROVIDER_SITE_OTHER): Payer: Self-pay | Admitting: Orthopaedic Surgery

## 2018-12-10 NOTE — Telephone Encounter (Signed)
Patient called, he has moved to [place] and needs copy of his records. I emailed him release form to complete.

## 2019-04-17 IMAGING — NM NM BONE 3 PHASE
10 series · 20 of 20 positions shown · non-contrast
Comparison: None

Radiographic correlation:  05/14/2017 LEFT knee radiographs

CLINICAL DATA: LEFT knee replacement 8549 with postoperative
infection, repeat total knee replacement 7880, new onset LEFT knee
pain and swelling for 2 weeks with no recent trauma

EXAM:
NUCLEAR MEDICINE 3-PHASE BONE SCAN
TECHNIQUE: Radionuclide angiographic images, immediate static blood pool
images, and 3-hour delayed static images were obtained of the knees
after intravenous injection of radiopharmaceutical.
RADIOPHARMACEUTICALS:  20.5 mCi Ac-FFm MDP IV

[Series 1: flow · 2.07mm/px · 6 of 48 frames shown (1 of 2)]
[frame 5/48]
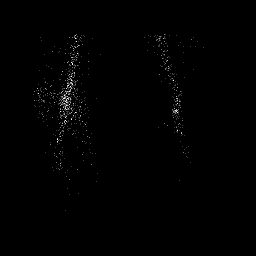
[frame 13/48  full-range]
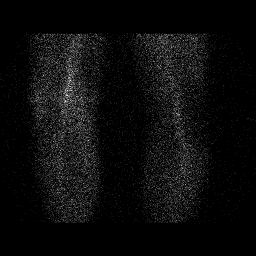
[frame 21/48  full-range]
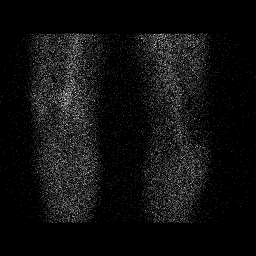
[frame 29/48  full-range]
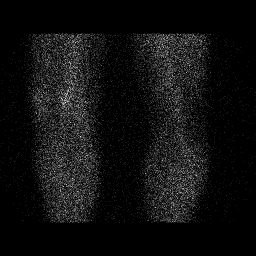
[frame 37/48  full-range]
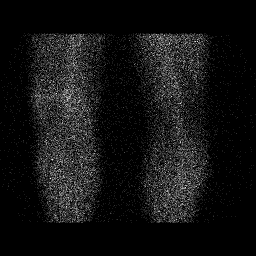
[frame 45/48  full-range]
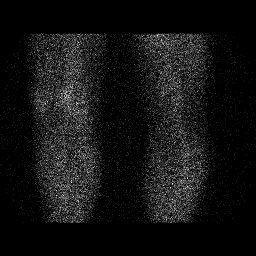

[Series 1: flow · 2.07mm/px · 6 of 48 frames shown (2 of 2)]
[frame 5/48]
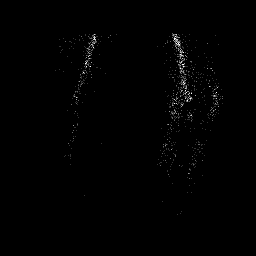
[frame 13/48  full-range]
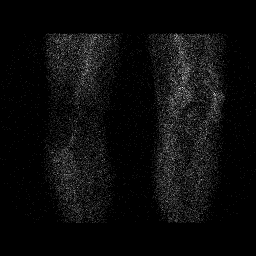
[frame 21/48  full-range]
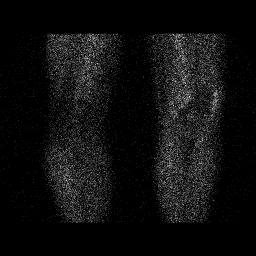
[frame 29/48  full-range]
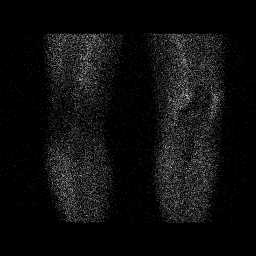
[frame 37/48  full-range]
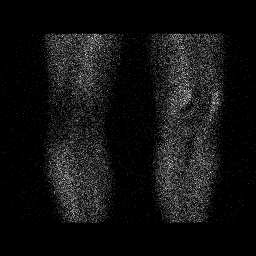
[frame 45/48  full-range]
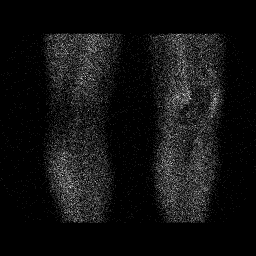

[Series 2: blood pool · 2.07mm/px · 1 of 1 slices shown (1 of 6)]
[im 1/1]
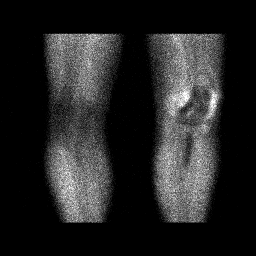

[Series 2: blood pool · 2.07mm/px · 1 of 1 slices shown (2 of 6)]
[im 1/1  full-range]
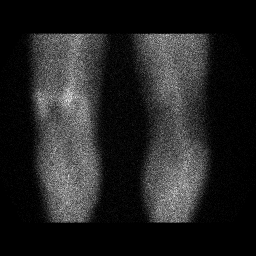

[Series 3: lat bp · 2.07mm/px · 1 of 1 slices shown (1 of 2)]
[im 1/1]
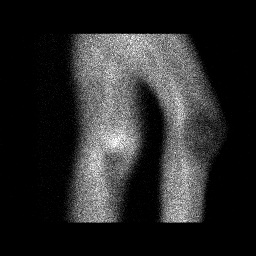

[Series 3: lat bp · 2.07mm/px · 1 of 1 slices shown (2 of 2)]
[im 1/1]
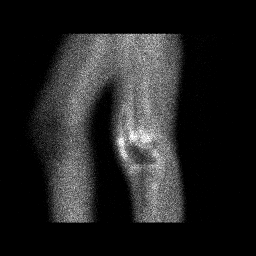

[Series 4: blood pool · 2.07mm/px · 1 of 1 slices shown (3 of 6)]
[im 1/1]
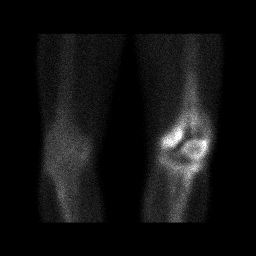

[Series 4: blood pool · 2.07mm/px · 1 of 1 slices shown (4 of 6)]
[im 1/1]
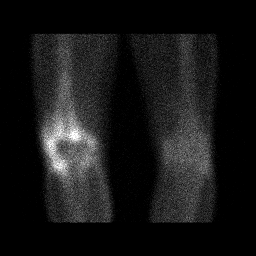

[Series 5: blood pool · 2.07mm/px · 1 of 1 slices shown (5 of 6)]
[im 1/1]
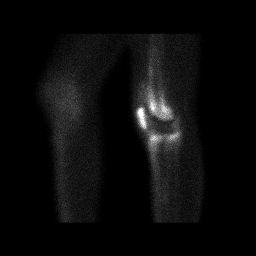

[Series 5: blood pool · 2.07mm/px · 1 of 1 slices shown (6 of 6)]
[im 1/1]
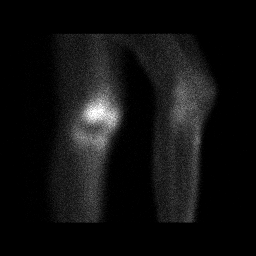

[20 of 20 positions shown; findings below may reference images not displayed]

FINDINGS: Vascular phase: Increased blood flow to the periarticular region of
the LEFT knee at the level of the femoral condyles.

Blood pool phase: Increased blood pool surrounding the LEFT knee
joint at the level of the patella and femoral condyles.

Delayed phase: Mild diffuse increased delayed tracer localization in
the LEFT knee focally increased at the medial femoral condyles. Mild
diffuse uptake adjacent to the patella and lateral compartment. No
abnormal tracer localization at RIGHT knee.
IMPRESSION: Normal three-phase bone scan of the RIGHT knee.

Increased blood flow, blood pool and mild delayed uptake of tracer
at the LEFT knee adjacent to particularly the femoral component of
the LEFT knee prosthesis with lesser degree of uptake at the patella
and lateral compartment.

Findings are consistent with a diffuse synovitis though, with the
delayed more intense tracer localization at the medial femoral
condyle, cannot completely exclude infection or loosening of the
femoral component of the LEFT knee prosthesis.

## 2019-09-08 IMAGING — DX DG KNEE 1-2V PORT*L*
2 series · 2 of 2 positions shown · non-contrast
Comparison: 03/20/2016 plain film exam.

CLINICAL DATA: 61-year-old male post revision left knee prosthesis.
Initial encounter.

EXAM:
PORTABLE LEFT KNEE - 1-2 VIEW

[knee ap]
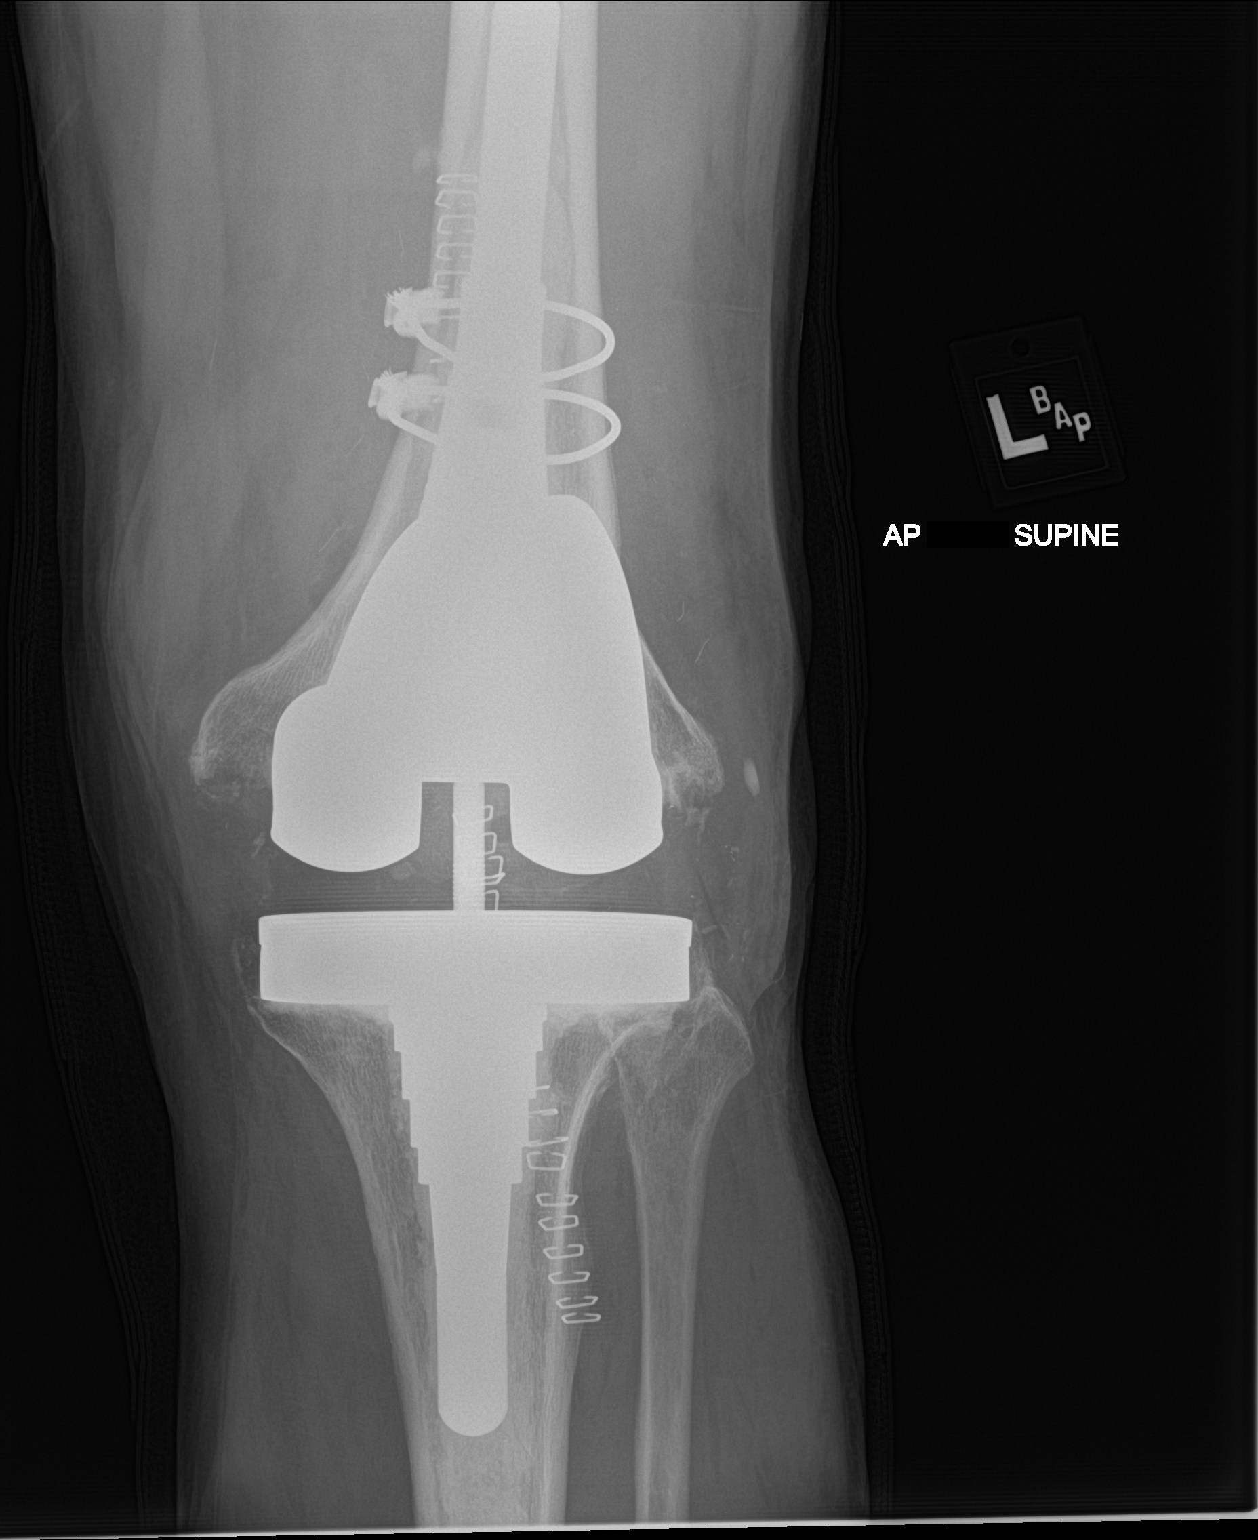

[knee lat]
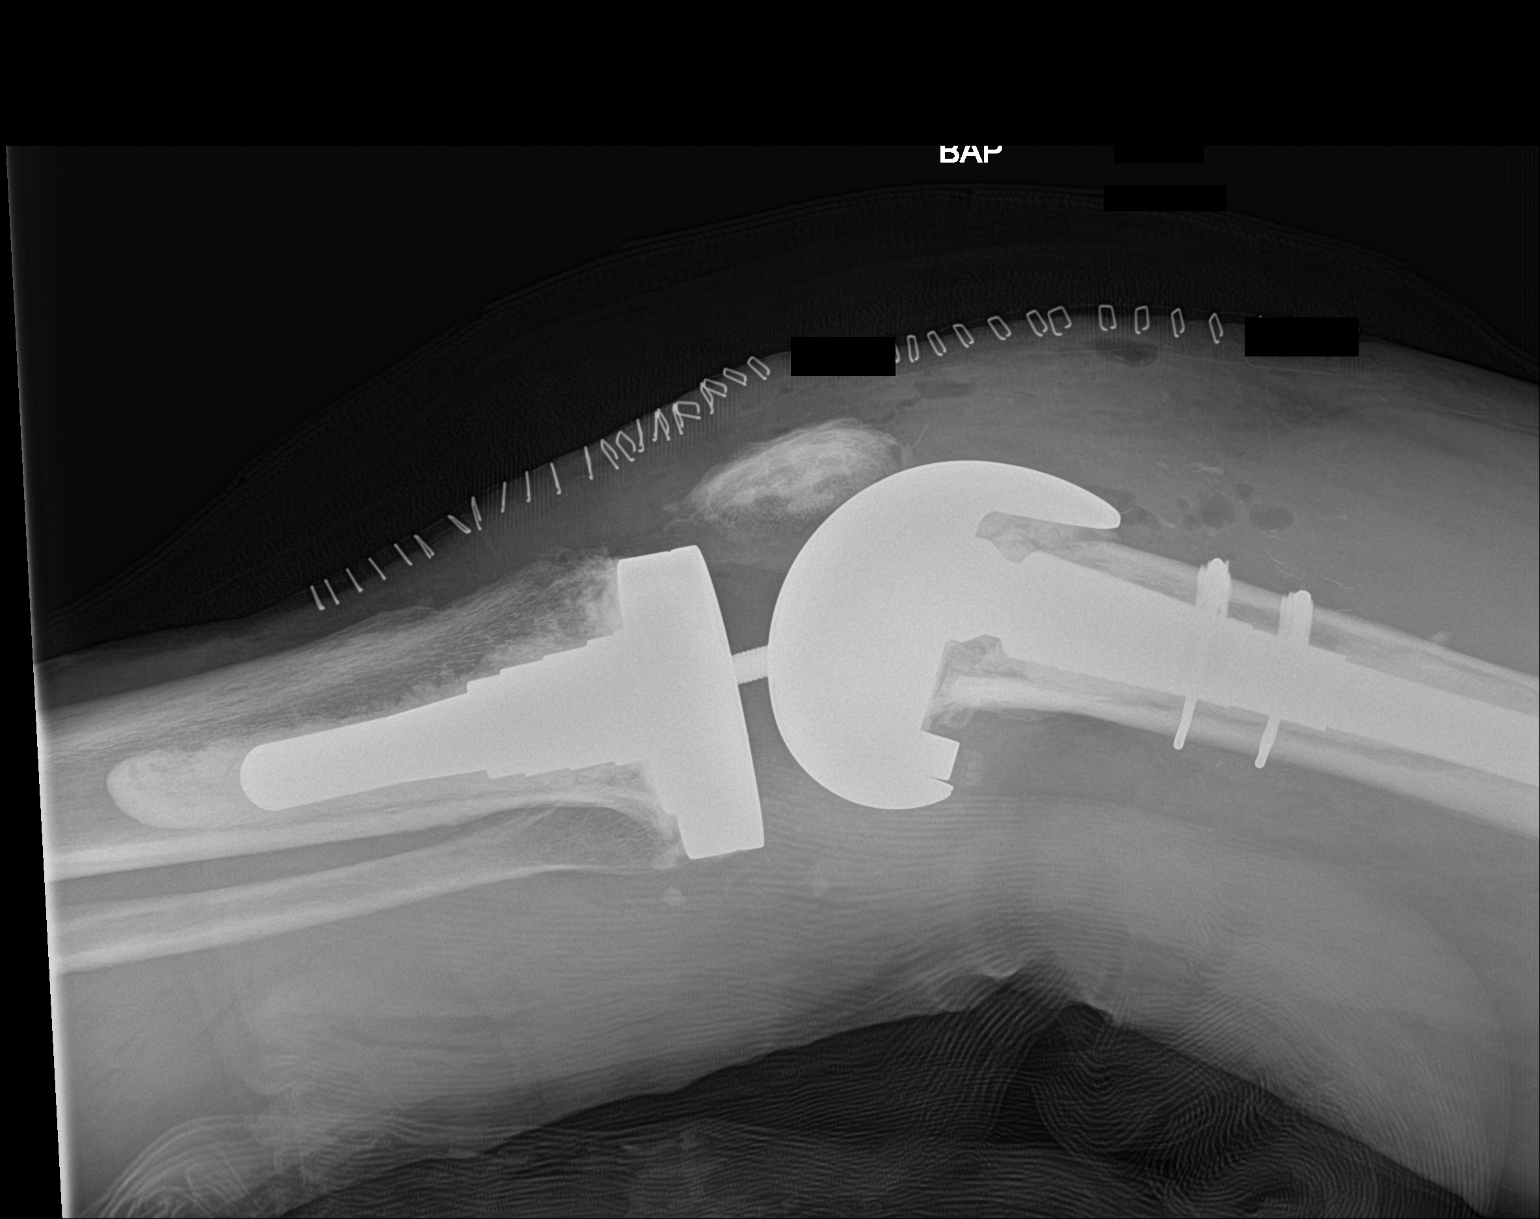

[2 of 2 positions shown; findings below may reference images not displayed]

FINDINGS: Post revision left knee prosthesis.

Tibial component unremarkable.

Femoral component not completely included on present exam. Cerclage
wires utilized distal femoral shaft level. Tiny radiopaque structure
medial aspect without surrounding fracture noted.
IMPRESSION: Post revision left knee prosthesis.

Tibial component unremarkable.

Femoral component not completely included on present exam.

## 2020-07-20 ENCOUNTER — Telehealth: Payer: Self-pay | Admitting: Orthopaedic Surgery

## 2020-07-20 NOTE — Telephone Encounter (Signed)
Patient called requesting a call back. Patient states he is having some issues with his knee that had replacement. Please call patient at [phone] for medical advice.

## 2020-07-20 NOTE — Telephone Encounter (Signed)
I spoke with the pt and she stated he twisted his replacement knee and has been having pain. He stated he moved to [place] so is unable to f/u with Dr. Magnus Ivan and wanted to know what to do. He stated no ortho MD would see him in [place] because of his infection after sx and multiple surgeries to that knee. I advised the pt to start with his pcp and see what they say. He stated understanding. He is scheduled to see them next week. He will call back if anything is needed more from [place].

## 2021-10-07 ENCOUNTER — Telehealth: Payer: Self-pay | Admitting: Orthopaedic Surgery

## 2021-10-07 NOTE — Telephone Encounter (Signed)
Received call from pt wanting his records faxed to a Dr. Charyl DancerMelissa Thidault. I advised pt we do need his signed authorization before we can fax his records. He asked I mail form to him. I mailed to him at [address] [place], [place]  [id number]. Ph [phone]

## 2022-08-04 ENCOUNTER — Telehealth: Payer: Self-pay | Admitting: Orthopaedic Surgery

## 2022-08-04 NOTE — Telephone Encounter (Signed)
Patient called. Would like [name] to call him. Did not give a reason. Says he got a phone call and thinks [name] was trying to call him. His call back number is [phone]

## 2022-08-04 NOTE — Telephone Encounter (Signed)
Holding for Ashley-do not see where she has tried to reach him in the chart.

## 2022-08-09 ENCOUNTER — Ambulatory Visit (INDEPENDENT_AMBULATORY_CARE_PROVIDER_SITE_OTHER): Payer: Medicare (Managed Care)

## 2022-08-09 ENCOUNTER — Ambulatory Visit (INDEPENDENT_AMBULATORY_CARE_PROVIDER_SITE_OTHER): Payer: Medicare (Managed Care) | Admitting: Orthopaedic Surgery

## 2022-08-09 ENCOUNTER — Encounter: Payer: Self-pay | Admitting: Orthopaedic Surgery

## 2022-08-09 DIAGNOSIS — Z96652 Presence of left artificial knee joint: Secondary | ICD-10-CM | POA: Diagnosis not present

## 2022-08-09 NOTE — Telephone Encounter (Signed)
Pt is being seen today in office

## 2022-08-09 NOTE — Progress Notes (Signed)
The patient is a 66 year old gentleman well-known to me.  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  His last surgery was in February 2017.  That was a two-stage procedure with removal of antibiotic spacer and revising the knee.  He has had a history of poor bone quality.  He has had extensive spinal fusion from the upper thoracic spine through the lumbar spine.  He lives in [place] now but also has had surgery in [place].  His left knee has developed chronic pain.  On examination left knee there is no redness and no effusion.  There is no significant warmth.  His pain is around the thigh area and medial.  2 views left knee reviewed and compared to films from 2019.  The bone has become significant osteopenic.  There is concern about prosthetic loosening.  I cannot rule out residual chronic infection but certainly his bone quality is concerning.  At this point there is not a lot of options.  1 option would be removal of implants and a long rod with fusion of the knee.  1 other option would be an above-knee amputation.  We could also send him to an orthopedic oncologist to consider whether or not he is a candidate for distal femur replacement.  He is going to take all of these things into consideration and let us know the direction he would like to take.  He is also going to likely seek other opinions and I would also send him for other opinions as well.

## 2023-05-10 ENCOUNTER — Telehealth: Payer: Self-pay | Admitting: Orthopaedic Surgery

## 2023-05-10 NOTE — Telephone Encounter (Signed)
Received call from patient requesting records. I emailed him Berkley Harvey to complete to email on file. He will email back to me .

## 2023-07-19 ENCOUNTER — Telehealth: Payer: Self-pay | Admitting: Orthopedic Surgery

## 2023-07-19 NOTE — Telephone Encounter (Signed)
Received call from patient asking for the dates of 3 surgeries. IC lmvm advising the 3 dates 10/27/2017, 09/21/2015 & 08/06/2015.

## 2024-06-16 ENCOUNTER — Encounter: Payer: Self-pay | Admitting: Radiology
